# Patient Record
Sex: Male | Born: 1955 | Race: Black or African American | Hispanic: No | Marital: Single | Smoking: Never smoker
Health system: Southern US, Community
[De-identification: ages and names within clinical notes are randomized; demographics above are authoritative.]

## PROBLEM LIST (undated history)

## (undated) DIAGNOSIS — N400 Enlarged prostate without lower urinary tract symptoms: Secondary | ICD-10-CM

## (undated) DIAGNOSIS — R1310 Dysphagia, unspecified: Secondary | ICD-10-CM

## (undated) DIAGNOSIS — G2581 Restless legs syndrome: Secondary | ICD-10-CM

## (undated) DIAGNOSIS — S069XAA Unspecified intracranial injury with loss of consciousness status unknown, initial encounter: Secondary | ICD-10-CM

## (undated) DIAGNOSIS — I10 Essential (primary) hypertension: Secondary | ICD-10-CM

## (undated) DIAGNOSIS — K219 Gastro-esophageal reflux disease without esophagitis: Secondary | ICD-10-CM

## (undated) DIAGNOSIS — E669 Obesity, unspecified: Secondary | ICD-10-CM

## (undated) DIAGNOSIS — N390 Urinary tract infection, site not specified: Secondary | ICD-10-CM

## (undated) DIAGNOSIS — K591 Functional diarrhea: Secondary | ICD-10-CM

## (undated) DIAGNOSIS — Z1211 Encounter for screening for malignant neoplasm of colon: Secondary | ICD-10-CM

## (undated) DIAGNOSIS — R739 Hyperglycemia, unspecified: Secondary | ICD-10-CM

## (undated) HISTORY — PX: CHOLECYSTECTOMY: SHX55

## (undated) HISTORY — PX: OTHER SURGICAL HISTORY: SHX169

## (undated) HISTORY — PX: HERNIA REPAIR: SHX51

---

## 2002-04-03 ENCOUNTER — Encounter: Payer: Self-pay | Admitting: Family Medicine

## 2002-04-03 ENCOUNTER — Ambulatory Visit (HOSPITAL_COMMUNITY): Admission: RE | Admit: 2002-04-03 | Discharge: 2002-04-03 | Payer: Self-pay | Admitting: Nurse Practitioner

## 2008-02-01 LAB — PSA SCREENING (SCREENING): Prostate Specific Ag: 0.8

## 2009-10-30 LAB — PSA SCREENING (SCREENING): Prostate Specific Ag: 1.96

## 2010-08-11 DIAGNOSIS — N400 Enlarged prostate without lower urinary tract symptoms: Secondary | ICD-10-CM | POA: Insufficient documentation

## 2010-08-15 MED ORDER — NITROFURANTOIN (25% MACROCRYSTAL FORM) 100 MG CAP
100 mg | ORAL_CAPSULE | Freq: Two times a day (BID) | ORAL | Status: AC
Start: 2010-08-15 — End: 2010-08-22

## 2010-08-15 NOTE — Telephone Encounter (Signed)
Called and spoke with pt's sister Darel Hong - told uc +, Rx Macrobid 100 mg bid x 7 days per Dr Esmeralda Arthur.  Rx faxed to [facility].    Sister also wanted to let Dr Verdie Mosher know that he has always had to stay overnight after any surgery because of muscle spasms and weakness. She would feel better if he stayed overnight after his surgery next week.

## 2010-08-18 NOTE — Procedures (Signed)
Test Reason : Pre/Op Cardiovascular Exam   Blood Pressure : ***/*** mmHG   Vent. Rate : 058 BPM     Atrial Rate : 058 BPM      P-R Int : 172 ms          QRS Dur : 096 ms       QT Int : 422 ms       P-R-T Axes : 055 108 017 degrees      QTc Int : 414 ms   Sinus bradycardia   Rightward axis   Abnormal ECG   No previous ECGs available   Confirmed by Miller, M.D., Edward ([id number]) on 08/18/2010 1:22:26 PM   Referred By:             Overread By: Edward Miller, M.D.

## 2010-08-18 NOTE — Procedures (Signed)
Test Reason : Pre/Op Cardiovascular Exam   Blood Pressure : ***/*** mmHG   Vent. Rate : 058 BPM     Atrial Rate : 058 BPM      P-R Int : 172 ms          QRS Dur : 096 ms       QT Int : 422 ms       P-R-T Axes : 055 108 017 degrees      QTc Int : 414 ms   Sinus bradycardia   Rightward axis   Abnormal ECG   No previous ECGs available   Confirmed by Hyacinth Meeker, M.D., Ramon Dredge ([id number]) on 08/18/2010 1:22:26 PM   Referred By:             Overread By: Berton St. Martin, M.D.
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
## 2010-08-21 NOTE — Op Note (Signed)
[facility]   Operation Report   NAME:  [name] Murillo, [name] Murillo   SEX:   M   DATE: 08/21/2010   DOB: 1955/09/15   MR#    [id number]   ROOM:     ACCT#  [id number]       cc: Mathews Argyle MD       PREOPERATIVE DIAGNOSES:   Neuropathic bladder, urinary incontinence and recurrent urinary tract    infections.       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       PROCEDURES PERFORMED:   Cystoscopy, retrograde pyelograms and dilation of bladder neck contracture.       SURGEON:   Mathews Argyle, MD       ANESTHESIA:   General.       BLOOD LOSS:   Minimal.       COMPLICATIONS:   None.       POSTOPERATIVE DRAINS:   None.       DESCRIPTION OF PROCEDURE:   The patient was identified in the holding area.  He received 2 grams of Ancef    preoperatively.  He was brought into the cysto room and placed on the table.     After induction of anesthesia, his legs were put up into the lithotomy    position in Isabel stirrups.  His lower abdomen and genitalia were prepped and    draped in a sterile fashion.  After appropriate timeout, I passed a 21-French    rigid cystoscope under direct visualization into the urethra.  He had no    evidence of any urethral stricture.  As I advanced the scope through the    prostate, he had previous TURP defect and there was a mild bladder neck    contracture approximately 20-French in size.  I was able to push the 21-French    scope through the contracture and get into the bladder.  The bladder was    moderately trabeculated with several cellule formations.  Both ureteral    orifices were located in their orthotopic positions.  I carefully inspected    the entire bladder and found no other urothelial lesions and no stones within    the bladder.  I first cannulated the right ureteral orifice.  Contrast was     injected for a right retrograde pyelogram which showed a normal ureter and    renal collecting system with no filling defects and no obstruction.  I then    similarly cannulated the left ureteral orifice and injected contrast for a    left retrograde pyelogram.  Some small air bubbles were visualized migrating    up into the proximal ureter and renal pelvis, but there was no evidence of any    obstruction, and after even efflux of the air bubbles, I saw no evidence of    any other filling defects within the left renal collecting system.  There was    good drainage of contrast from both upper tracts.  After completion of the    retrograde pyelograms, I then gently dilated the bladder neck contracture with    Sissy Hoff urethral sounds from 20-French all the way up to 26-French.  The    bladder neck was widely patent after this procedure.  There was minimal    bleeding.  We drained all of the irrigation fluid  out of the patient's    bladder.  He was awakened and taken to the recovery room in stable condition.           ___________________   Page Spiro MD   Dictated By:.    lo   D:08/21/2010   T: 08/21/2010 10:51:23   [id number]

## 2010-08-21 NOTE — Op Note (Signed)
[facility]   Operation Report   NAME:  [name] Murillo, [name] Murillo   SEX:   M   DATE: 08/21/2010   DOB: 08-01-1955   MR#    [id number]   ROOM:     ACCT#  [id number]       cc: Mathews Argyle MD       PREOPERATIVE DIAGNOSES:   Neuropathic bladder, urinary incontinence and recurrent urinary tract    infections.       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       PROCEDURES PERFORMED:   Cystoscopy, retrograde pyelograms and dilation of bladder neck contracture.       SURGEON:   Mathews Argyle, MD       ANESTHESIA:   General.       BLOOD LOSS:   Minimal.       COMPLICATIONS:   None.       POSTOPERATIVE DRAINS:   None.       DESCRIPTION OF PROCEDURE:   The patient was identified in the holding area.  He received 2 grams of Ancef    preoperatively.  He was brought into the cysto room and placed on the table.     After induction of anesthesia, his legs were put up into the lithotomy    position in [place] stirrups.  His lower abdomen and genitalia were prepped and    draped in a sterile fashion.  After appropriate timeout, I passed a 21-French    rigid cystoscope under direct visualization into the urethra.  He had no    evidence of any urethral stricture.  As I advanced the scope through the    prostate, he had previous TURP defect and there was a mild bladder neck    contracture approximately 20-French in size.  I was able to push the 21-French    scope through the contracture and get into the bladder.  The bladder was    moderately trabeculated with several cellule formations.  Both ureteral    orifices were located in their orthotopic positions.  I carefully inspected    the entire bladder and found no other urothelial lesions and no stones within    the bladder.  I first cannulated the right ureteral orifice.  Contrast was    injected for a right retrograde pyelogram  which showed a normal ureter and    renal collecting system with no filling defects and no obstruction.  I then    similarly cannulated the left ureteral orifice and injected contrast for a    left retrograde pyelogram.  Some small air bubbles were visualized migrating    up into the proximal ureter and renal pelvis, but there was no evidence of any    obstruction, and after even efflux of the air bubbles, I saw no evidence of    any other filling defects within the left renal collecting system.  There was    good drainage of contrast from both upper tracts.  After completion of the    retrograde pyelograms, I then gently dilated the bladder neck contracture with    Sissy Hoff urethral sounds from 20-French all the way up to 26-French.  The    bladder neck was widely patent after this procedure.  There was minimal    bleeding.  We drained all of the irrigation fluid  out of the patient's    bladder.  He was awakened and taken to the recovery room in stable condition.           ___________________   Page Spiro MD   Dictated By:.    lo   D:08/21/2010   T: 08/21/2010 10:51:23   [id number]
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
## 2010-12-22 NOTE — ED Provider Notes (Signed)
KNOWN ALLERGIES   NKDA       TRIAGE   PATIENT: NAME: [name] Murillo, AGE: 55, GENDER: male, DOB: 06/16/1956, TIME OF GREET: Mon Dec 22, 2010 12:18, LANGUAGE: [place],         [place]: [id number], MEDICAL RECORD NUMBER: [phone], ACCOUNT NUMBER:         [id number], PCP: [place], Hyman Hopes,.   ADMISSION: URGENCY: 3, AMBULANCE: [place] #, TRANSPORT:         Ambulance, DEPT: Emergency, BED: 2ED 40.   COMPLAINT:  Medic Dizzy.   PRESENTING COMPLAINT:  Dizziness, Since Today.   PAIN: No complaint of pain.   IMMUNIZATIONS:  Immunizations up to date, Last tetanus shot         received less than 10 years ago.   TB SCREENING: TB screen not applicable for this patient.   ABUSE SCREENING: Not Applicable.   FALL RISK: Patient has a high risk of falling.   SUICIDAL IDEATION: Not Applicable.   ADVANCE DIRECTIVES: Unknown if patient has advance directives,         Triage assessment performed.   PROVIDERS: TRIAGE NURSE: Lajean Silvius, RN.       AMBULANCE   AMBULANCE: Ambulance: Mon Dec 22, 2010 11:56.       CURRENT MEDICATIONS   Lopressor:  50 mg Oral once a day.   Baclofen:  20 mg Oral 4 times a day.   Benicar:  40 mg Oral once a day.   Lidocaine, Topical:  Patch presently on back.       ORDERS   Urine dip (send for lab U/A if positive):  Ordered for: Wilfrid Lund, MD, Tawanna Cooler         Status: Done by Ludwig Clarks RN, MSN, Kim-Sun Mon Dec 22, 2010 13:48.   CHEST 2 VIEWS:  Ordered for: Wilfrid Lund, MD, Todd         Status: Cancelled by Alferd Patee Dec 22, 2010 13:27.   12 LEAD EKG:  Ordered for: Wilfrid Lund, MD, Todd         Status: Cancelled by Alferd Patee Dec 22, 2010 13:27.   I-Stat Chem8+ (complete):  Ordered for: Wilfrid Lund, MD, Tawanna Cooler         Status: Done by Ludwig Clarks RN, MSN, Kim-Sun Mon Dec 22, 2010 13:48.   orthostatic VS:  Ordered for: Wilfrid Lund, MD, Tawanna Cooler         Status: Done by Margo Aye, RN, [facility] Dec 22, 2010 13:55.   URINAL:  Ordered for: Wilfrid Lund, MD, Todd         Status: Active.       Ordered for: Wilfrid Lund, MD, Todd         Status: Active.       NURSING ASSESSMENT: CARDIOVASCULAR   CONSTITUTIONAL: History obtained from patient, Patient arrives,         via [facility], Gait steady, Patient appears         comfortable, Patient cooperative, Patient alert, Oriented to person,         place and time, Skin warm, Skin dry, Skin normal in color, Mucous         membranes pink, Mucous membranes moist, Patient is well-groomed,         Patient complains of Dizziness this morning, Not dizzy at this time.   PAIN: Patient rates pain as  0 out of 10.   CARDIOVASCULAR: Cardiovascular assessment findings include heart         rate normal, Associated with diaphoresis, currently resolved.   RESPIRATORY/CHEST: Respiratory assessment findings include         respiratory effort easy, Respirations regular, Conversing normally,         no signs of distress, Breath sounds clear, to bilateral upper lobes,         to bilateral lower lobes, Neck and chest exam findings include         trachea midline, Chest expansion equal, Chest movement symmetrical,         no jugular vein distension.   SAFETY: Side rails up, Cart/Stretcher in lowest position, Family         at bedside, Call light within reach, Hospital ID band on.       NURSING PROCEDURE: DISCHARGE NOTE   DISCHARGE: Patient discharged to home, in a wheelchair, family         driving, accompanied by husband/wife/partner, Discharge instructions         given to patient, Simple or moderate discharge teaching performed.   SAFETY: Side rails up, Cart/Stretcher in lowest position, Family         at bedside.       NURSING PROCEDURE: LAB DRAW   PATIENT IDENTIFIER: Patient's identity verified by patient         stating name, Patient's identity verified by patient stating birth         date, Patient's identity verified by hospital ID bracelet.   LAB DRAW: Initial lab draw performed, by venipuncture, from left          antecubital, in one attempt, Lab specimens labeled in the presence of         the patient and sent to lab, Blood cultures were drawn at 1350.   FOLLOW-UP: After procedure, dressing applied to site.   NOTES: Patient tolerated procedure well.   SAFETY: Cart/Stretcher in lowest position, Family at bedside,         Call light within reach, Hospital ID band on.       NURSING PROCEDURE: ORTHOSTATIC VITAL SIGNS   PATIENT IDENTIFIER: Patient's identity verified by patient         stating name, Patient's identity verified by patient stating birth         date, Patient's identity verified by hospital ID bracelet.   ORTHOSTATIC VITAL SIGNS: Orthostatic vital signs indicated for         dizziness, Lying:, Systolic blood pressure: 134, Diastolic blood         pressure: 76, Pulse: 61, No dizziness, Sitting:, Systolic blood         pressure: 145, Diastolic blood pressure: 96, Pulse: 67, No dizziness         with position change, Standing:, Systolic blood pressure: 139,         Diastolic blood pressure: 90, Pulse: 67, No dizziness with position         change.   SAFETY: Side rails up, Cart/Stretcher in lowest position, Family         at bedside, Call light within reach, Hospital ID band on.       DISPOSITION   PATIENT:  Disposition Type: Discharged, Disposition: Discharged,         Condition: Stable.      IV Infusion: N/A, Patient left the department.       INSTRUCTION   FOLLOWUP:  [facility],  [facility], [id number], [place]         [place] [address], [phone].   SPECIAL:  Follow up with primary care physician this week.         Drink plenty of fluids.         Take your medications as prescribed.         Return to the ER if condition worsens or new symptoms develop.   Key:     CNL2=Lamb, RN, Charmaine  CRH1=Hall, RN, [employer]  KMJ=Jones, PA-C, [place]     KNM0=Moreira, RN, MSN, Kim-Sun

## 2011-01-05 NOTE — Op Note (Signed)
[facility]   Operation Report   NAME:  [place], [place]   SEX:   M   DATE: 01/05/2011   DOB: [birth year]   MR#    [id number]   ROOM:     ACCT#  [id number]               PREOPERATIVE DIAGNOSIS:   Gallstones, chronic cholecystitis.       POSTOPERATIVE DIAGNOSIS:   Gallstones, chronic cholecystitis.       PROCEDURE PERFORMED:   Laparoscopic cholecystectomy.       SURGEON:   Dr. Rosebud Poles.        ANESTHESIA:   General.       ESTIMATED BLOOD LOSS:   Minimal.       ASSISTANT:   Magnus Sinning       SPECIMENS:   Gallbladder.       DESCRIPTION OF PROCEDURE:   The patient was brought to the operating room and after receiving general    anesthesia by the anesthesiology team, his abdomen was prepped and draped in a    sterile fashion.  We made a 0.5 cm transverse umbilical incision.  A Veress    was passed intraperitoneally.  The abdomen was insufflated to 14 mmHg    pressure.  The needle was removed.  We placed a 5 mm trocar.  The video    endoscope was placed.  Laparoscopy revealed the gallbladder to be chronically    inflamed.  Under direct vision, we placed upper midline 10/12 trocar and 2    lateral 5-mm trocars.  Lateral ports were used to place the liver and    gallbladder in upward traction.  The tract laterally to expose the region of    the triangle of Calot.  The cystic duct was identified.  It was dissected    free, high at its junction with the gallbladder.  The duct was then clipped    proximally times 2, distally times 1 and divided.  We were then able to    identify the cystic artery.  This was also dissected free high at its junction    with the artery, was then clipped proximally times 2, distally times 1 and    divided.  The gallbladder was then dissected out of liver bed using    electrocautery.  It was removed through the upper midline port in an Endobag    and sent for permanent section.  Hemostasis was complete.  Sponge, needle and     instrument counts were announced correct times 2 by the circulating staff.     Camera and ports removed.  Fascial incisions in the upper midline port were    closed with 0 Vicryl sutures.  The skin incisions were all closed with    staples.  Dry sterile dressings were applied.  The patient tolerated procedure    well.           ___________________   Marlowe Alt MD   Dictated By:.    ak   D:01/05/2011   T: 01/05/2011 17:03:06   [id number]

## 2011-01-05 NOTE — Op Note (Signed)
[facility]   Operation Report   NAME:  [name] Murillo, [name] Murillo   SEX:   M   DATE: 01/05/2011   DOB: May 14, 1956   MR#    [id number]   ROOM:     ACCT#  [id number]               PREOPERATIVE DIAGNOSIS:   Gallstones, chronic cholecystitis.       POSTOPERATIVE DIAGNOSIS:   Gallstones, chronic cholecystitis.       PROCEDURE PERFORMED:   Laparoscopic cholecystectomy.       SURGEON:   Dr. Rosebud Poles.        ANESTHESIA:   General.       ESTIMATED BLOOD LOSS:   Minimal.       ASSISTANT:   [name] Murillo       SPECIMENS:   Gallbladder.       DESCRIPTION OF PROCEDURE:   The patient was brought to the operating room and after receiving general    anesthesia by the anesthesiology team, his abdomen was prepped and draped in a    sterile fashion.  We made a 0.5 cm transverse umbilical incision.  A Veress    was passed intraperitoneally.  The abdomen was insufflated to 14 mmHg    pressure.  The needle was removed.  We placed a 5 mm trocar.  The video    endoscope was placed.  Laparoscopy revealed the gallbladder to be chronically    inflamed.  Under direct vision, we placed upper midline 10/12 trocar and 2    lateral 5-mm trocars.  Lateral ports were used to place the liver and    gallbladder in upward traction.  The tract laterally to expose the region of    the triangle of Calot.  The cystic duct was identified.  It was dissected    free, high at its junction with the gallbladder.  The duct was then clipped    proximally times 2, distally times 1 and divided.  We were then able to    identify the cystic artery.  This was also dissected free high at its junction    with the artery, was then clipped proximally times 2, distally times 1 and    divided.  The gallbladder was then dissected out of liver bed using    electrocautery.  It was removed through the upper midline port in an Endobag    and sent for permanent section.  Hemostasis was complete.  Sponge, needle and    instrument counts were announced correct times 2 by the  circulating staff.     Camera and ports removed.  Fascial incisions in the upper midline port were    closed with 0 Vicryl sutures.  The skin incisions were all closed with    staples.  Dry sterile dressings were applied.  The patient tolerated procedure    well.           ___________________   Marlowe Alt MD   Dictated By:.    ak   D:01/05/2011   T: 01/05/2011 17:03:06   [id number]

## 2011-02-09 LAB — AMB POC URINALYSIS DIP STICK AUTO W/O MICRO
Bilirubin (UA POC): NEGATIVE
Glucose (UA POC): NEGATIVE
Ketones (UA POC): NEGATIVE
Nitrites (UA POC): NEGATIVE
Protein (UA POC): NEGATIVE mg/dL
Specific gravity (UA POC): 1.01 (ref 1.001–1.035)
Urobilinogen (UA POC): 0.2
pH (UA POC): 6.5 (ref 4.6–8.0)

## 2011-02-09 LAB — AMB POC PVR, MEAS,POST-VOID RES,US,NON-IMAGING: PVR: 0 cc
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
## 2011-02-09 NOTE — Patient Instructions (Signed)
MyChart Activation    Thank you for requesting access to MyChart. Please follow the instructions below to securely access and download your online medical record. MyChart allows you to send messages to your doctor, view your test results, renew your prescriptions, schedule appointments, and more.    How Do I Sign Up?    1. In your internet browser, go to [email or web address]  2. Click on the First Time User? Click Here link in the Sign In box. You will be redirect to the New Member Sign Up page.  3. Enter your MyChart Access Code exactly as it appears below. You will not need to use this code after you???ve completed the sign-up process. If you do not sign up before the expiration date, you must request a new code.    MyChart Access Code: [email or web address]  Expires: 05/10/2011  4:03 PM (This is the date your MyChart access code will expire)    4. Enter the last four digits of your Social Security Number (xxxx) and Date of Birth (mm/dd/yyyy) as indicated and click Submit. You will be taken to the next sign-up page.  5. Create a MyChart ID. This will be your MyChart login ID and cannot be changed, so think of one that is secure and easy to remember.  6. Create a MyChart password. You can change your password at any time.  7. Enter your Password Reset Question and Answer. This can be used at a later time if you forget your password.   8. Enter your e-mail address. You will receive e-mail notification when new information is available in MyChart.  9. Click Sign Up. You can now view and download portions of your medical record.  10. Click the Download Summary menu link to download a portable copy of your medical information.    Additional Information    If you have questions, please visit the Frequently Asked Questions section of the MyChart website at [email or web address] Remember, MyChart is NOT to be used for urgent needs. For medical emergencies, dial 911.

## 2011-02-11 ENCOUNTER — Telehealth

## 2011-02-11 MED ORDER — NITROFURANTOIN (25% MACROCRYSTAL FORM) 100 MG CAP
100 mg | ORAL_CAPSULE | Freq: Two times a day (BID) | ORAL | Status: AC
Start: 2011-02-11 — End: 2011-02-16

## 2011-02-11 NOTE — Telephone Encounter (Signed)
Staph UTI.  macrobid bid x 10 days.  Rx faxed to pharmacy of choice, please notify patient to pick up and take as directed

## 2011-02-12 NOTE — Telephone Encounter (Signed)
[name] Darel Hong (pt's caregiver) told.

## 2011-02-26 MED ORDER — TRIMETHOPRIM-SULFAMETHOXAZOLE 160 MG-800 MG TAB
160-800 mg | ORAL_TABLET | Freq: Two times a day (BID) | ORAL | Status: AC
Start: 2011-02-26 — End: 2011-03-08

## 2011-02-26 NOTE — Telephone Encounter (Signed)
If someone could please give Bosie Clos a phone call regarding her brother. Pt had a UTI and was giving Macrobid pt is done with all of his medication and he is still having problems. Pt sister can be reached at [phone]               Thank you

## 2011-02-26 NOTE — Telephone Encounter (Signed)
Spoke to [place] the patient's sister.  She said that she thinks he still might have a UTI.  He finished the Marcobid on Sunday and he is still having Frequency and Fever.  I advised patient's sister that he would need to bring in another urine specimen to see if it is already gone.  She said because patient is in a wheel chair, it is hard for him to get in the office as well as he can not pee on command.  Please advise if we can give him another antibiotic to try or what you would suggest

## 2011-02-26 NOTE — Telephone Encounter (Signed)
Can use Bactrim bid x 10 days.  Rx faxed to pharmacy of choice, please notify patient to pick up and take as directed

## 2011-02-27 NOTE — Telephone Encounter (Signed)
Spoke to patient's sister and will pick up medication at [facility]

## 2011-05-11 LAB — AMB POC URINALYSIS DIP STICK AUTO W/O MICRO
Bilirubin (UA POC): NEGATIVE
Glucose (UA POC): NEGATIVE
Ketones (UA POC): NEGATIVE
Nitrites (UA POC): NEGATIVE
Specific gravity (UA POC): 1.03 (ref 1.001–1.035)
Urobilinogen (UA POC): 0.2 (ref 0.2–1)
pH (UA POC): 6 (ref 4.6–8.0)

## 2011-05-11 LAB — AMB POC PVR, MEAS,POST-VOID RES,US,NON-IMAGING: PVR: 7 cc
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
## 2011-05-11 NOTE — Patient Instructions (Signed)
MyChart Activation    Thank you for requesting access to MyChart. Please follow the instructions below to securely access and download your online medical record. MyChart allows you to send messages to your doctor, view your test results, renew your prescriptions, schedule appointments, and more.    How Do I Sign Up?    1. In your internet browser, go to [email or web address]  2. Click on the First Time User? Click Here link in the Sign In box. You will be redirect to the New Member Sign Up page.  3. Enter your MyChart Access Code exactly as it appears below. You will not need to use this code after you???ve completed the sign-up process. If you do not sign up before the expiration date, you must request a new code.    MyChart Access Code: [email or web address]  Expires: 08/09/2011 11:21 AM (This is the date your MyChart access code will expire)    4. Enter the last four digits of your Social Security Number (xxxx) and Date of Birth (mm/dd/yyyy) as indicated and click Submit. You will be taken to the next sign-up page.  5. Create a MyChart ID. This will be your MyChart login ID and cannot be changed, so think of one that is secure and easy to remember.  6. Create a MyChart password. You can change your password at any time.  7. Enter your Password Reset Question and Answer. This can be used at a later time if you forget your password.   8. Enter your e-mail address. You will receive e-mail notification when new information is available in MyChart.  9. Click Sign Up. You can now view and download portions of your medical record.  10. Click the Download Summary menu link to download a portable copy of your medical information.    Additional Information    If you have questions, please visit the Frequently Asked Questions section of the MyChart website at [email or web address] Remember, MyChart is NOT to be used for urgent needs. For medical emergencies, dial 911.

## 2011-05-19 NOTE — Telephone Encounter (Signed)
[name] called stating Darl Pikes at the surgeon's office stated she did not receive a letter of clearance for pt to have surgery.  Told Ms Lipari we faxed over last office note which includes the following statements:    Catheterized patient for sterile specimen and will repeat culture today. If sterile can proceed with surgery as planned.    Also faxed the urine culture which was no growth. Explained this to Ms Hunt and called Darl Pikes - left her a message stating above but also re-faxed both office note and culture to her.

## 2011-07-05 DIAGNOSIS — K219 Gastro-esophageal reflux disease without esophagitis: Secondary | ICD-10-CM | POA: Insufficient documentation

## 2011-08-14 DIAGNOSIS — S069XAS Unspecified intracranial injury with loss of consciousness status unknown, sequela: Secondary | ICD-10-CM | POA: Insufficient documentation

## 2011-10-14 LAB — AMB POC URINALYSIS DIP STICK AUTO W/O MICRO
Bilirubin (UA POC): NEGATIVE
Blood (UA POC): NEGATIVE
Glucose (UA POC): NEGATIVE
Nitrites (UA POC): NEGATIVE
Specific gravity (UA POC): 1.025 (ref 1.001–1.035)
Urobilinogen (UA POC): 0.2 (ref 0.2–1)
pH (UA POC): 6.5 (ref 4.6–8.0)

## 2011-10-14 LAB — AMB POC PVR, MEAS,POST-VOID RES,US,NON-IMAGING: PVR: 19 cc
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
## 2011-10-14 NOTE — Telephone Encounter (Signed)
[name] called to ask to see Dr. Verdie Mosher because her brother was having incontience, no flow while unrinating, frequency, and fever. I offered the walk in clinic and they were ok with that idea. I told them that I would let them know that they were coming.
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
## 2011-10-19 MED ORDER — TRIMETHOPRIM-SULFAMETHOXAZOLE 160 MG-800 MG TAB
160-800 mg | ORAL_TABLET | Freq: Two times a day (BID) | ORAL | Status: AC
Start: 2011-10-19 — End: 2011-10-24

## 2011-10-19 NOTE — Telephone Encounter (Signed)
I spoke with the sister who is on the updated HIPPA form and she is aware of the positive culture and medication that was e scribed to the pharmacy.

## 2011-10-19 NOTE — Progress Notes (Signed)
Quick Note:    Start on Bactrim DS BID x 5 days  Marranda Arakelian, NP    ______

## 2011-10-19 NOTE — Telephone Encounter (Signed)
I called the patient and left a message on the home phone and the mobile for the patient to call back to the office. I was calling because the patient has a positive culture and medication was e scribed to the pharmacy that is listed in the chart. It is Bactrim DS PO BID x 5 days.

## 2011-11-02 LAB — AMB POC URINALYSIS DIP STICK AUTO W/O MICRO
Bilirubin (UA POC): NEGATIVE
Blood (UA POC): NEGATIVE
Glucose (UA POC): NEGATIVE
Ketones (UA POC): NEGATIVE
Nitrites (UA POC): NEGATIVE
Protein (UA POC): NEGATIVE mg/dL
Specific gravity (UA POC): 1.005 (ref 1.001–1.035)
Urobilinogen (UA POC): 0.2 (ref 0.2–1)
pH (UA POC): 5.5 (ref 4.6–8.0)

## 2011-11-02 NOTE — Progress Notes (Signed)
11/02/2011  Pt in with possible UTI.  His caregiver states he is weak so it is assumed that he has a UTI.  Denies dysuria, hematuria, freq/urgency.    Results for orders placed in visit on 11/02/11   AMB POC URINALYSIS DIP STICK AUTO W/O MICRO       Component Value Range    Color Yellow  (none)    Clarity Clear  (none)    Glucose Negative  (none)    Bilirubin Negative  (none)    Ketones Negative  (none)    Spec.Grav. 1.005  1.001 - 1.035    Blood Negative  (none)    pH 5.5  4.6 - 8.0    Protein, Urine Negative  Negative mg/dL    Urobilinogen 0.2 mg/dL  0.2 - 1    Nitrites Negative  (none)    Leukocyte esterase 1+  (none)     Exam-abd-soft, non-tender  Back-no CVAT    Imp-?UTI  Plan-will send urine for C & S and treat if necessary  Lytle Butte, NP

## 2011-11-02 NOTE — Telephone Encounter (Signed)
Patients sister called to advise her brothers UTI is acting up again and she wants him to be seen as soon as possible. She mentioned that she was told at last visit that he can come back to clinic if needed. Thanks

## 2011-11-02 NOTE — Communication Body (Signed)
11/02/2011  Pt in with possible UTI.  His caregiver states he is weak so it is assumed that he has a UTI.  Denies dysuria, hematuria, freq/urgency.    Results for orders placed in visit on 11/02/11   AMB POC URINALYSIS DIP STICK AUTO W/O MICRO       Component Value Range    Color Yellow  (none)    Clarity Clear  (none)    Glucose Negative  (none)    Bilirubin Negative  (none)    Ketones Negative  (none)    Spec.Grav. 1.005  1.001 - 1.035    Blood Negative  (none)    pH 5.5  4.6 - 8.0    Protein, Urine Negative  Negative mg/dL    Urobilinogen 0.2 mg/dL  0.2 - 1    Nitrites Negative  (none)    Leukocyte esterase 1+  (none)     Exam-abd-soft, non-tender  Back-no CVAT    Imp-?UTI  Plan-will send urine for C & S and treat if necessary  Atif Chapple, NP

## 2011-11-11 NOTE — Telephone Encounter (Signed)
Letter mailed out results.

## 2012-02-23 NOTE — ED Provider Notes (Signed)
KNOWN ALLERGIES   NKDA   TRIAGE (Tue Feb 23, 2012 07:56 BRH0)   PATIENT: NAME: [name] Murillo, [place]: male, DOB: Sun Aug 28, 1955, TIME OF GREET: Tue Feb 23, 2012 07:54 by Cherre Robins, RN,         LANGUAGE: [place], [place] WEIGHT: 106.6. (Tue Feb 23, 2012 07:56 BRH0)     HEIGHT: 195cm, BMI: 28.03. (08:14 BRH0)   ADMISSION: URGENCY: 4, AMBULANCE: [facility], TRANSPORT:         Ambulance - BLS, DEPT: Emergency, BED: [facility]ing 06. (Tue Feb 23, 2012         07:56 BRH0)   COMPLAINT:  Fall. (Tue Feb 23, 2012 07:56 BRH0)   PRESENTING COMPLAINT:  Fall-Right knee injury, Since Today, TIME         Since 07:00. (08:19 BRH0)   PAIN: Patient complains of pain, Pain described as aching, On a         scale 0-10 patient rates pain as 2, Pain is constant. (08:19 BRH0)   TB SCREENING: TB screen not applicable for this patient. (08:19         BRH0)   ABUSE SCREENING: Patient denies physical abuse or threats. (08:19         BRH0)   FALL RISK: Patient has a high risk of falling. (08:19 BRH0)   SUICIDAL IDEATION: Suicidal ideation is not present. (08:19 BRH0)   ADVANCE DIRECTIVES: Patient does not have advance directives.         (08:19 BRH0)   PROVIDERS: TRIAGE NURSE: Cherre Robins, RN. (Tue Feb 23, 2012         07:56 BRH0)   PREVIOUS VISIT ALLERGIES: NKDA. (08:19 BRH0)   PRESENTING PROBLEM (07:57 BRH0)      Presenting problems: Knee Injury-Pain-Swelling.   AMBULANCE (07:33 CML2)   NOTES: Blood Pressure: 124/79, Blood Sugar: 88.   AMBULANCE: Ambulance: Tue Feb 23, 2012 07:33.   GREET (07:54 BRH0)   GREET: Greet: Tue Feb 23, 2012 07:54.   CURRENT MEDICATIONS   Polyethylene Glycol 3350 : Powder For Reconstitution : - : Oral:          17 gm Oral once a day. (08:05 BRH0)   AmLODIPine Besylate : Tablet : 10 Mg : Oral:  1 tab(s) Oral once         a day. (08:06 BRH0)   Baclofen : Tablet : 20 Mg : Oral:  See Notes. 1 tab in a.m., 2         tabs in evening and at HS. (08:07 BRH0)    Tamsulosin Hydrochloride : Capsule : 0.4 Mg : Oral:  1 tab(s)         Oral once a day (at bedtime). (08:07 BRH0)   Aspir 81 : Delayed Release Tablet : 81 Mg : Oral:  1 tab(s) Oral         once a day. (08:09 BRH0)   Metoprolol Succinate ER : Tablet, Extended Release : 50 Mg : Oral:          1 tab(s) Oral once a day. (08:09 BRH0)      Name: [name] Murillo, [name] Murillo  DOB: Jul 20, 1956 M56 MedRec: [id number]  AcctNum:     [id number]   Benicar : Tablet : 40 Mg : Oral:  1 tab(s) Oral once a day.         (08:09 BRH0)   Centrum Silver : Tablet : Therapeutic Multiple Vitamins With Minerals         :  Oral (08:10 BRH0)   Carbidopa-Levodopa : Tablet : 25 Mg-100 Mg : Oral:  1 tab(s) Oral         3 times a day. (08:10 BRH0)   Vitamin D3 : Capsule : 1000 Intl Units : Oral:  1 tab(s) Oral         once a day. (08:11 BRH0)   Omeprazole : Delayed Release Capsule : 40 Mg : Oral:  1 tab(s)         Oral once a day. (08:13 BRH0)   Lidoderm : Film : 5% : Topical (08:13 BRH0)   Gabapentin : Capsule : 300 Mg : Oral:  1 tab(s) Oral 2 times a         day. (08:14 BRH0)   Elavil : Tablet : 10 Mg : Oral (08:21 BRH0)   ORDERS   KNEE COMPLETE:  Ordered for: Buddy Duty, M.D., Toni Amend         Status: Active         Comment: fall. (08:01 JOHO)   KNEE COMPLETE:  Ordered for: Buddy Duty, M.D., Toni Amend         Status: Active         Comment: fall. (08:01 JOHO)   ACE wrap:  Ordered forBuddy Duty, M.D., Courtney         Status: Done by: Vilma Meckel - Tue Feb 23, 2012 13:04. (09:01         CTZ0)   road test with walker:  Ordered for: Buddy Duty, M.D., Toni Amend         Status: Canceled by: Doretha Sou, RN, Bridget - Tue Feb 23, 2012 13:11.         (09:04 CTZ0)   LUMBARSACRAL SP 2 OR 3 VIEWS:  Ordered for: Buddy Duty, M.D.,         Toni Amend         Status: Active         Comment: FALL. (09:43 JOHO)   PELVIS 1 OR 2 VIEWS:  Ordered for: Buddy Duty, M.D., Toni Amend         Status: Active         Comment: FALL. (09:43 JOHO)   URINAL:  Ordered forBuddy Duty, M.D., Courtney          Status: Active. (11:31 BRH0)   URINAL:  Ordered for: Buddy Duty, M.D., Courtney         Status: Active. (11:31 BRH0)   Page Dr. Rozanna Box (PCP):  Ordered for: Buddy Duty, M.D.,         Toni Amend         Status: Done by: Mariane [name] Murillo - Tue Feb 23, 2012 11:41. (11:32         CTZ0)   Page hospitalist:  Lenna Gilford for: Buddy Duty, M.D., Toni Amend         Status: Done by: Mariane [name] Murillo - Tue Feb 23, 2012 12:35. (12:23         CTZ0)   Ace Wrap 6 inch:  Ordered for: Buddy Duty, M.D., Courtney         Status: Active. (14:59 BRH0)   NURSING ASSESSMENT: EXTREMITY LOWER (08:04 BRH0)   CONSTITUTIONAL: History obtained from, Emergency Medical      Name: [name] Murillo, [name] Murillo  DOB: [birth year] M56 MedRec: [id number]  AcctNum:     [id number]         Services, family member: sister, Patient arrives, via Emergency         Medical Services, Unsteady gait, Lift to cart, Inability to ambulate,  Patient appears comfortable, Patient cooperative, Patient alert,         Oriented to person, place and time, Skin warm, Skin dry, Skin normal         in color, Mucous membranes pink, Mucous membranes moist, Patient is         well-groomed.   PAIN: aching pain, to the right knee, constant, on a scale 0-10         patient rates pain as 2.   RIGHT LOWER EXTREMITY: Right lower extremity assessment findings         include capillary refill less than 2 seconds, Skin color normal, Skin         temperature warm, Distal sensation intact, Notes: Leg gave out on him         while trying to get out of the shower. Felt a pop while going down to         the floor per sister. No LOC. Pt did not hit his head per sister.   NURSING PROCEDURE: DISCHARGE NOTE (14:59 BRH0)   DISCHARGE: Patient discharged to home, in a wheelchair, family         driving, accompanied by other family member, Discharge instructions         given to patient, Discharge instructions given to legal guardian,         Simple or moderate discharge teaching performed, Above person(s)          verbalized understanding of discharge instructions and follow-up         care.   NURSING PROCEDURE: NURSE NOTES   NURSES NOTES: Pillow given to patient. (09:58 BRH0)     Notes: Family requesting pain med for pt and states they did not want the         road test because the reason he is here is because he did poorly with         the walker road test and was sent here to be admitted to a rehab.         (12:22 JDP0)     Other consult at bedside, from Care Manager. (13:24 BRH0)     Notes: Care Manager- Spoke to pt and family, FOC obtained for [facility] for possible PT/OT. [facility] rep will be in to speak to         pt and family.---Everlene Balls RN Care Manager. (13:28 SNA2)   NURSING PROCEDURE: SPLINTING [phone])   PATIENT IDENTIFIER: Patient's identity verified by patient         stating name, Patient's identity verified by patient stating birth         date, Patient's identity verified by hospital ID bracelet, Patient         actively involved in identification process.   SPLINTING: Splinting indicated for sprain care, Notes: applied 6         inch ace wrap to right knee.   FOLLOW-UP: After procedure, capillary refill less than 2 seconds,         After procedure, distal circulation intact, After procedure, distal         motor function intact, After procedure, distal sensation intact,         After procedure, distal pulses present.   SAFETY: Side rails up, Cart/Stretcher in lowest position, Family         at bedside, Call light within reach, Hospital ID band on.   NURSING PROCEDURE:  TRANSPORT TO TESTS   PATIENT IDENTIFIER: Patient's identity verified by patient         stating name, Patient's identity verified by patient stating birth         date, Patient's identity verified by hospital ID bracelet, Patient's      Name: [name] Murillo, [name] Murillo  DOB: 1956/02/09 M56 MedRec: [id number]  AcctNum:     [id number]         identity verified by family member. (08:32 DNS4)      Patient's identity verified by patient stating name, Patient's identity         verified by patient stating birth date, Patient's identity verified         by hospital ID bracelet, Patient's identity verified by family         member. (10:35 JNH1)   TRANSPORT TO TESTS: Patient transported to x-ray, via cart,         Accompanied by x-ray technician. (08:32 DNS4)     Patient transported to x-ray, via cart, Accompanied by x-ray technician,         Hand-off report received from [facility], RN, Clarisse Gouge. (10:35 JNH1)     Patient transported to, P T, via cart, Accompanied by emergency         department technician, Notes: For ambulatuion. (10:48 BRH0)   FOLLOW-UP: After procedure, patient returned to emergency         department. (08:32 DNS4)     After procedure, patient returned to emergency department, Hand-off         report was given to [facility], RN, Clarisse Gouge. (10:35 JNH1)     After procedure, patient returned to emergency department. (11:31 BRH0)   SAFETY: Side rails up, Cart/Stretcher in lowest position, Family         at bedside, Call light within reach. (08:32 DNS4)     Side rails up, Cart/Stretcher in lowest position, Family at bedside, Call         light within reach, Hospital ID band on. (10:35 JNH1)   DIAGNOSIS (09:01 CTZ0)   FINAL: PRIMARY: Right knee Strain, ADDITIONAL: Fall.   DISPOSITION   PATIENT:  Disposition Type: Discharged, Disposition: Discharge,         Condition: Stable. (09:01 CTZ0)      Disposition Type: (none), Disposition: (none), Condition: (none). (09:56         JOHO)      Disposition Type: Discharged, Disposition: Discharge, Condition: Stable.         (13:31 JOHO)      Patient left the department. (14:59 BRH0)   VITAL SIGNS   VITAL SIGNS: BP: 123/75 (Lying), Pulse: 74, Resp: 16, Temp: 98.4         (Oral), Pain: 2, O2 sat: 100 on Room air, Time: 02/23/2012 08:00.         (08:01 BRH0)     BP: 138/86 (Lying), Pulse: 79, Resp: 18, Time: 02/23/2012 10:38. (10:41         BRH0)      BP: 126/81 (Lying), Pulse: 79, Resp: 16, Temp: 97.8 (Oral), Pain: 2, O2         sat: 100 on Room air, Time: 02/23/2012 14:31. (14:32 BRH0)   INSTRUCTION (13:40 JOHO)   DISCHARGE:  KNEE SPRAIN [facility]).   [address], [address],         [place] [address], [phone].   SPECIAL:  -please follow-up w/ your doctor regarding placement in  a rehabilitation facility         -ice /elevate right knee         -tylenol as needed for pain         -Please follow-up with an orthopedist for recheck of knee if not      Name: [name] Murillo, [name] Murillo  DOB: 1956-05-27 M56 MedRec: [id number]  AcctNum:     [id number]         improving next 3-4 days.   EVENTS   TRANSFER:  Triage to [facility] 06. (Tue Feb 23, 2012 07:56 BRH0)      Removed from [facility] 06. (14:59 BRH0)   PRESCRIPTION     No recorded prescriptions   Key:     AMW4=Weddle, PM, Freida Busman BRH0=Hazer, RN, [employer] CML2=Loebe, RN, Jill Side     CTZ0=Zydron, M.D., Toni Amend DNS4=Stragand, RAD [facility], Onalee Hua JDP0=Parker,     RN,     Adela Lank     JNH1=Holland, RAD [facility], Raynelle Fanning JOHO=Hubbard, PA-C, Amil Amen SNA2=Amick, RN,     Jasmine December      Name: [name] Murillo, [name] Murillo  DOB: 1955/09/29 M56 MedRec: [id number]  AcctNum:     [id number]

## 2012-04-24 LAB — METABOLIC PANEL, BASIC
BUN: 18 mg/dl (ref 7–25)
CO2: 28 mEq/L (ref 21–32)
Calcium: 8.4 mg/dl — ABNORMAL LOW (ref 8.5–10.1)
Chloride: 108 mEq/L — ABNORMAL HIGH (ref 98–107)
Creatinine: 1.1 mg/dl (ref 0.6–1.3)
GFR est AA: >60
GFR est non-AA: >60
Glucose: 76 mg/dl (ref 74–106)
Potassium: 4 mEq/L (ref 3.5–5.1)
Sodium: 143 mEq/L (ref 136–145)

## 2012-04-24 LAB — CBC WITH AUTOMATED DIFF
BASOPHILS: 0 % (ref 0–3)
EOSINOPHILS: 3 % (ref 0–5)
HCT: 38 % (ref 37.0–50.0)
HGB: 12.4 gm/dl (ref 12.4–17.2)
LYMPHOCYTES: 17 % — ABNORMAL LOW (ref 28–48)
MCH: 30.8 pg (ref 23.0–34.6)
MCHC: 32.6 gm/dl (ref 30.0–36.0)
MCV: 94.5 fL (ref 80.0–98.0)
MONOCYTES: 12 % (ref 1–13)
MPV: 7.6 fL (ref 6.0–10.0)
NEUTROPHILS: 69 % — ABNORMAL HIGH (ref 34–64)
NRBC: 0 (ref 0–0)
PLATELET: 204 10*3/uL (ref 140–450)
RBC: 4.02 M/uL (ref 3.80–5.70)
RDW: 14.2 % — ABNORMAL HIGH (ref 11.5–14.0)
WBC: 6.3 10*3/uL (ref 4.0–11.0)

## 2012-04-24 LAB — POC URINE MACROSCOPIC
Bilirubin: NEGATIVE
Glucose: NEGATIVE mg/dl
Ketone: 80 mg/dl — AB
Nitrites: POSITIVE — AB
Protein: NEGATIVE mg/dl
Specific gravity: 1.025 (ref 1.005–1.030)
Urobilinogen: 0.2 EU/dl (ref 0.0–1.0)
pH (UA): 5.5 (ref 5–9)

## 2012-04-24 LAB — LACTIC ACID: Lactic Acid: 0.9 mmol/L (ref 0.4–2.0)

## 2012-04-24 LAB — TROPONIN I
Troponin-I: <0.015 ng/ml (ref 0.00–0.09)
Troponin-I: <0.015 ng/ml (ref 0.00–0.09)

## 2012-04-24 LAB — CKMB PROFILE
CK - MB: <0.5 ng/ml (ref 0.0–3.6)
CK - MB: <0.5 ng/ml (ref 0.0–3.6)
CK-MB Index: 0.6 % (ref 0.0–4.9)
CK-MB Index: 0.6 % (ref 0.0–4.9)
CK: 69 U/L (ref 39–308)
CK: 70 U/L (ref 39–308)

## 2012-04-24 LAB — PROCALCITONIN: PROCALCITONIN: <0.05 ng/ml (ref 0.00–0.50)
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
## 2012-04-25 LAB — RENAL FUNCTION PANEL
Albumin: 3.1 gm/dl — ABNORMAL LOW (ref 3.4–5.0)
BUN: 15 mg/dl (ref 7–25)
CO2: 26 mEq/L (ref 21–32)
Calcium: 8.1 mg/dl — ABNORMAL LOW (ref 8.5–10.1)
Chloride: 110 mEq/L — ABNORMAL HIGH (ref 98–107)
Creatinine: 0.7 mg/dl (ref 0.6–1.3)
GFR est AA: >60
GFR est non-AA: >60
Glucose: 87 mg/dl (ref 74–106)
Phosphorus: 3 mg/dl (ref 2.5–4.9)
Potassium: 3.7 mEq/L (ref 3.5–5.1)
Sodium: 144 mEq/L (ref 136–145)

## 2012-04-25 LAB — CBC WITH AUTOMATED DIFF
BASOPHILS: 0 % (ref 0–3)
EOSINOPHILS: 5 % (ref 0–5)
HCT: 36.1 % — ABNORMAL LOW (ref 37.0–50.0)
HGB: 11.6 gm/dl — ABNORMAL LOW (ref 12.4–17.2)
LYMPHOCYTES: 34 % (ref 28–48)
MCH: 30.4 pg (ref 23.0–34.6)
MCHC: 32.2 gm/dl (ref 30.0–36.0)
MCV: 94.6 fL (ref 80.0–98.0)
MONOCYTES: 13 % (ref 1–13)
MPV: 8.1 fL (ref 6.0–10.0)
NEUTROPHILS: 49 % (ref 34–64)
NRBC: 0 (ref 0–0)
PLATELET: 178 10*3/uL (ref 140–450)
RBC: 3.82 M/uL (ref 3.80–5.70)
RDW: 14.4 % — ABNORMAL HIGH (ref 11.5–14.0)
WBC: 5.1 10*3/uL (ref 4.0–11.0)

## 2012-04-25 LAB — BNP: BNP: 7.4 pg/ml (ref 0.0–99.0)

## 2012-04-25 LAB — POC URINE MICROSCOPIC

## 2012-04-25 LAB — MAGNESIUM: Magnesium: 1.8 mg/dl (ref 1.8–2.4)

## 2012-04-25 LAB — CKMB PROFILE
CK - MB: <0.5 ng/ml (ref 0.0–3.6)
CK-MB Index: 0.6 % (ref 0.0–4.9)
CK: 64 U/L (ref 39–308)

## 2012-04-25 LAB — T4, FREE: Free T4: 1.25 ng/dl (ref 0.76–1.46)

## 2012-04-25 LAB — TROPONIN I: Troponin-I: <0.015 ng/ml (ref 0.00–0.09)

## 2012-04-26 LAB — METABOLIC PANEL, BASIC
BUN: 8 mg/dl (ref 7–25)
CO2: 28 mEq/L (ref 21–32)
Calcium: 7.8 mg/dl — ABNORMAL LOW (ref 8.5–10.1)
Chloride: 107 mEq/L (ref 98–107)
Creatinine: 0.7 mg/dl (ref 0.6–1.3)
GFR est AA: >60
GFR est non-AA: >60
Glucose: 91 mg/dl (ref 74–106)
Potassium: 3.5 mEq/L (ref 3.5–5.1)
Sodium: 141 mEq/L (ref 136–145)

## 2012-04-26 LAB — URINALYSIS W/ RFLX MICROSCOPIC
Bilirubin: NEGATIVE
Bilirubin: NEGATIVE
Blood: NEGATIVE
Blood: NEGATIVE
Glucose: NEGATIVE mg/dl
Glucose: NEGATIVE mg/dl
Ketone: NEGATIVE mg/dl
Ketone: NEGATIVE mg/dl
Nitrites: NEGATIVE
Nitrites: NEGATIVE
Protein: NEGATIVE mg/dl
Protein: NEGATIVE mg/dl
Specific gravity: 1.015 (ref 1.005–1.030)
Specific gravity: 1.015 (ref 1.005–1.030)
Urobilinogen: 0.2 mg/dl (ref 0.0–1.0)
Urobilinogen: 0.2 mg/dl (ref 0.0–1.0)
pH (UA): 6 (ref 5.0–9.0)
pH (UA): 6 (ref 5.0–9.0)

## 2012-04-26 LAB — CORTISOL: Cortisol, random: 14.01 ug/dL

## 2012-04-26 LAB — TSH 3RD GENERATION: TSH: 2.21 u[IU]/mL (ref 0.358–3.740)
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
## 2012-05-13 DIAGNOSIS — IMO0001 Reserved for inherently not codable concepts without codable children: Secondary | ICD-10-CM | POA: Insufficient documentation

## 2012-10-02 LAB — POC URINE MACROSCOPIC
Bilirubin: NEGATIVE
Blood: NEGATIVE
Glucose: NEGATIVE mg/dl
Ketone: NEGATIVE mg/dl
Nitrites: NEGATIVE
Protein: NEGATIVE mg/dl
Specific gravity: 1.02 (ref 1.005–1.030)
Urobilinogen: 0.2 EU/dl (ref 0.0–1.0)
pH (UA): 7 (ref 5–9)

## 2012-10-02 LAB — CBC WITH AUTOMATED DIFF
BASOPHILS: 1 % (ref 0–3)
EOSINOPHILS: 1 % (ref 0–5)
HCT: 38 % (ref 37.0–50.0)
HGB: 12.6 gm/dl (ref 12.4–17.2)
LYMPHOCYTES: 16 % — ABNORMAL LOW (ref 28–48)
MCH: 31 pg (ref 23.0–34.6)
MCHC: 33.2 gm/dl (ref 30.0–36.0)
MCV: 93.5 fL (ref 80.0–98.0)
MONOCYTES: 9 % (ref 1–13)
MPV: 7.6 fL (ref 6.0–10.0)
NEUTROPHILS: 73 % — ABNORMAL HIGH (ref 34–64)
NRBC: 0 (ref 0–0)
PLATELET: 195 10*3/uL (ref 140–450)
RBC: 4.06 M/uL (ref 3.80–5.70)
RDW: 14 % (ref 11.5–14.0)
WBC: 6 10*3/uL (ref 4.0–11.0)

## 2012-10-02 LAB — CKMB PROFILE
CK - MB: <0.5 ng/ml (ref 0.0–3.6)
CK-MB Index: 0.6 % (ref 0.0–4.9)
CK: 66 U/L (ref 39–308)

## 2012-10-02 LAB — METABOLIC PANEL, COMPREHENSIVE
ALT (SGPT): 10 U/L — ABNORMAL LOW (ref 12–78)
AST (SGOT): 12 U/L — ABNORMAL LOW (ref 15–37)
Albumin: 3.5 gm/dl (ref 3.4–5.0)
Alk. phosphatase: 63 U/L (ref 50–136)
BUN: 13 mg/dl (ref 7–25)
Bilirubin, total: 0.7 mg/dl (ref 0.2–1.0)
CO2: 30 mEq/L (ref 21–32)
Calcium: 8.2 mg/dl — ABNORMAL LOW (ref 8.5–10.1)
Chloride: 108 mEq/L — ABNORMAL HIGH (ref 98–107)
Creatinine: 0.8 mg/dl (ref 0.6–1.3)
GFR est AA: >60
GFR est non-AA: >60
Glucose: 112 mg/dl — ABNORMAL HIGH (ref 74–106)
Potassium: 3.5 mEq/L (ref 3.5–5.1)
Protein, total: 6.5 gm/dl (ref 6.4–8.2)
Sodium: 143 mEq/L (ref 136–145)

## 2012-10-02 LAB — TROPONIN I
Troponin-I: <0.015 ng/ml (ref 0.00–0.09)
Troponin-I: <0.015 ng/ml (ref 0.00–0.09)

## 2012-10-02 LAB — POC URINE MICROSCOPIC

## 2012-10-02 LAB — GLUCOSE, POC: Glucose (POC): 98 (ref 65–105)

## 2012-10-02 LAB — CK-MB,QT: CK - MB: <0.5 ng/ml (ref 0.0–3.6)

## 2012-10-02 LAB — CK: CK: 67 U/L (ref 39–308)
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
## 2012-10-03 LAB — CK-MB,QT: CK - MB: <0.5 ng/ml (ref 0.0–3.6)

## 2012-10-03 LAB — TROPONIN I: Troponin-I: <0.015 ng/ml (ref 0.00–0.09)

## 2012-10-03 LAB — CK: CK: 65 U/L (ref 39–308)
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
## 2012-10-04 LAB — METABOLIC PANEL, BASIC
BUN: 10 mg/dl (ref 7–25)
CO2: 28 mEq/L (ref 21–32)
Calcium: 8.3 mg/dl — ABNORMAL LOW (ref 8.5–10.1)
Chloride: 107 mEq/L (ref 98–107)
Creatinine: 0.7 mg/dl (ref 0.6–1.3)
GFR est AA: >60
GFR est non-AA: >60
Glucose: 90 mg/dl (ref 74–106)
Potassium: 3.7 mEq/L (ref 3.5–5.1)
Sodium: 141 mEq/L (ref 136–145)

## 2012-10-04 LAB — CBC WITH AUTOMATED DIFF
BASOPHILS: 0 % (ref 0–3)
EOSINOPHILS: 3 % (ref 0–5)
HCT: 36.3 % — ABNORMAL LOW (ref 37.0–50.0)
HGB: 12 gm/dl — ABNORMAL LOW (ref 12.4–17.2)
LYMPHOCYTES: 31 % (ref 28–48)
MCH: 31.1 pg (ref 23.0–34.6)
MCHC: 33 gm/dl (ref 30.0–36.0)
MCV: 94.2 fL (ref 80.0–98.0)
MONOCYTES: 13 % (ref 1–13)
MPV: 7.8 fL (ref 6.0–10.0)
NEUTROPHILS: 53 % (ref 34–64)
NRBC: 0 (ref 0–0)
PLATELET: 179 10*3/uL (ref 140–450)
RBC: 3.85 M/uL (ref 3.80–5.70)
RDW: 13.8 % (ref 11.5–14.0)
WBC: 5.6 10*3/uL (ref 4.0–11.0)

## 2012-10-04 LAB — GLUCOSE, POC
Glucose (POC): 106 — ABNORMAL HIGH (ref 65–105)
Glucose (POC): 92 (ref 65–105)

## 2012-10-05 LAB — GLUCOSE, POC
Glucose (POC): 112 mg/dL — ABNORMAL HIGH (ref 65–105)
Glucose (POC): 88 mg/dL (ref 65–105)

## 2012-10-05 NOTE — Discharge Summary (Signed)
[facility]  Discharge Summary   NAME:  [name] Murillo, [name] Murillo  SEX:   M  ADMIT: 10/02/2012  DISCH:   DOB:   10-Aug-1955  MR#    [id number]  ACCT#  [id number]    cc: Mathews Argyle MD, Rozanna Box M.D.    DATE OF ADMISSION:   10/02/2012    DATE OF DISCHARGE:   10/05/2012    The patient is improved on discharge.  He should follow up with Dr. Octavio Manns   in about a week or two for an office visit and looking at if he is still   orthostatic at all.  He should also follow up with Dr. Mathews Argyle soon.  He   could not tolerate Flomax because of orthostatic hypotensive and wonder if he   might benefit from perhaps Proscar but I will leave that to Dr. Theodora Blow   expertise to do.  Diet should be his regular and activity should be his   regular.  He should restart his [facility].    DISCHARGE MEDICATIONS:  His discharge medications will include his Centrum Silver daily,  furosemide   20 a day, aspirin 81 mg a day, baclofen I guess he takes 20 at one time and 30   b.i.d., Colace daily 100 mg, lisinopril 5 a day, Prilosec 40 mg a day,   Vitamin D 1000 international units a day, tizanidine 4 mg t.i.d.  and that is   another thing probably contributes but I guess he needs it for spasticity,   Sinemet 25/100 a day, metoprolol 24-hour that is the succinate 50 mg daily and   that is usually given at bedtime, lidocaine patch to his shoulder.    DISCHARGE DIAGNOSES:  1.  Syncope, probably secondary to orthostasis, probably contributing by meds.  2. Hypotension, resolving.  3. History of traumatic brain injury which is stable.  4. Peripheral neuropathy with muscle spasms.  5. Gastroesophageal reflux disease.  6. Hypertension which again I just got him on his low dose medicines.  7. Chronic constipation.  8. Left shoulder pain.    Please see dictated H&P as dictated by Dr. Terrace Arabia for details.    HOSPITAL COURSE:  1. The patient was admitted to telemetry without significant arrhythmias.  It    looks like he probably was orthostatic and he is better.  The plan is to let   him go home, maybe try hose if that that does not work, to consider midodrine   but he should stay off the Flomax for now and please see discussion below. His   hypertension was okay. I am going to let him go back on low dose lisinopril   probably just because it good for him and that Lasix but need to keep an eye   on it.  We may want to make the Lasix p.r.n., but I will leave that to his   usual doctor to do.   2. Gastroesophageal reflux disease was stable on his PPI and I continued that.      3. Spasticity.  Unfortunately, these medicines contribute to his dizziness,   etc., but we will have to keep him on board.  3. Shoulder pain. The patch really helped. We will see how he does.  He did   not quality for PT at home, but will continue nursing. I hope he does okay.    4.  For his BPH, he will need to follow up with Dr. Verdie Mosher and consideration for  maybe Proscar might be of value, but I will leave that to Dr. Theodora Blow expertise.   I would stay away from the alpha blockers because of the orthostasis.  I hope   he does well.    Total time coordinating care for discharge 34 minutes.      ___________________  Ollen Bowl MD  Dictated By: .   [facility]  D:10/05/2012  T: 10/05/2012 10:41:23  [id number]  Authenticated by Omar Person, M.D. On 10/05/2012 01:33:45 PM

## 2014-10-16 ENCOUNTER — Emergency Department: Admit: 2014-10-16 | Payer: MEDICARE | Primary: Family Medicine

## 2014-10-16 ENCOUNTER — Inpatient Hospital Stay
Admit: 2014-10-16 | Discharge: 2014-10-16 | Disposition: A | Discharge status: Home / Self Care | Payer: MEDICARE | Attending: Emergency Medicine

## 2014-10-16 DIAGNOSIS — S0990XA Unspecified injury of head, initial encounter: Secondary | ICD-10-CM

## 2014-10-16 MED ORDER — DIPHTH,PERTUS(ACEL)TETANUS VAC(PF) 2 LF-(5-3-5MCG)-5 LF/0.5 ML IM SUSP
2 Lf-(.5-5-3-5 mcg)-5Lf/0.5 mL | Freq: Once | INTRAMUSCULAR | Status: AC
Start: 2014-10-16 — End: 2014-10-16
  Administered 2014-10-16: 16:00:00 via INTRAMUSCULAR

## 2014-10-16 MED FILL — ADACEL (TDAP ADOLESN/ADULT)(PF)2LF-(2.5-5-3-5MCG)-5 LF/0.5 ML IM SUSP: 2 Lf-(.5-5-3-5 mcg)-5Lf/0.5 mL | INTRAMUSCULAR | Qty: 0.5

## 2014-10-16 NOTE — ED Notes (Signed)
[name] Murillo is a 59 y.o. male that was discharged in good and improved condition.  The patients diagnosis, condition and treatment were explained to  patient and caregiver and aftercare instructions were given.  The patient and caregiver verbalized understanding. Patient armband removed and given to patient to take home.  Patient was informed of the privacy risks if armband lost or stolen.
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
## 2015-08-21 ENCOUNTER — Emergency Department: Admit: 2015-08-21 | Payer: MEDICARE | Primary: Family Medicine

## 2015-08-21 ENCOUNTER — Inpatient Hospital Stay
Admit: 2015-08-21 | Discharge: 2015-08-21 | Disposition: A | Discharge status: Home / Self Care | Payer: MEDICARE | Attending: Emergency Medicine

## 2015-08-21 DIAGNOSIS — B349 Viral infection, unspecified: Secondary | ICD-10-CM

## 2015-08-21 LAB — METABOLIC PANEL, BASIC
BUN: 12 mg/dl (ref 7–25)
CO2: 30 mEq/L (ref 21–32)
Calcium: 8.4 mg/dl — ABNORMAL LOW (ref 8.5–10.1)
Chloride: 101 mEq/L (ref 98–107)
Creatinine: 0.7 mg/dl (ref 0.6–1.3)
GFR est AA: >60
GFR est non-AA: >60
Glucose: 97 mg/dl (ref 74–106)
Potassium: 3.5 mEq/L (ref 3.5–5.1)
Sodium: 139 mEq/L (ref 136–145)

## 2015-08-21 LAB — CBC WITH AUTOMATED DIFF
BASOPHILS: 0.5 % (ref 0–3)
EOSINOPHILS: 0.5 % (ref 0–5)
HCT: 38.9 % (ref 37.0–50.0)
HGB: 13.2 gm/dl (ref 12.4–17.2)
IMMATURE GRANULOCYTES: 0.5 % (ref 0.0–3.0)
LYMPHOCYTES: 5.1 % — ABNORMAL LOW (ref 28–48)
MCH: 30.6 pg (ref 23.0–34.6)
MCHC: 33.9 gm/dl (ref 30.0–36.0)
MCV: 90.3 fL (ref 80.0–98.0)
MONOCYTES: 16.6 % — ABNORMAL HIGH (ref 1–13)
MPV: 9.3 fL (ref 6.0–10.0)
NEUTROPHILS: 76.8 % — ABNORMAL HIGH (ref 34–64)
NRBC: 0 (ref 0–0)
PLATELET: 167 10*3/uL (ref 140–450)
RBC: 4.31 M/uL (ref 3.80–5.70)
RDW-SD: 44 — ABNORMAL HIGH (ref 35.1–43.9)
WBC: 4.4 10*3/uL (ref 4.0–11.0)

## 2015-08-21 LAB — POC URINE MACROSCOPIC
Bilirubin: NEGATIVE
Blood: NEGATIVE
Glucose: NEGATIVE mg/dl
Leukocyte Esterase: NEGATIVE
Nitrites: NEGATIVE
Protein: NEGATIVE mg/dl
Specific gravity: 1.02 (ref 1.005–1.030)
Urobilinogen: 0.2 EU/dl (ref 0.0–1.0)
pH (UA): 6.5 (ref 5–9)

## 2015-08-21 MED ORDER — OSELTAMIVIR PHOSPHATE 75 MG CAP
75 mg | ORAL_CAPSULE | Freq: Two times a day (BID) | ORAL | 0 refills | Status: DC
Start: 2015-08-21 — End: 2015-08-26

## 2015-08-21 MED ORDER — ACETAMINOPHEN 325 MG TABLET
325 mg | ORAL | Status: AC
Start: 2015-08-21 — End: 2015-08-21
  Administered 2015-08-21: 20:00:00 via ORAL

## 2015-08-21 MED ORDER — IBUPROFEN 400 MG TAB
400 mg | ORAL | Status: AC
Start: 2015-08-21 — End: 2015-08-21
  Administered 2015-08-21: 14:00:00 via ORAL

## 2015-08-21 MED ORDER — SODIUM CHLORIDE 0.9% BOLUS IV
0.9 % | Freq: Once | INTRAVENOUS | Status: AC
Start: 2015-08-21 — End: 2015-08-21
  Administered 2015-08-21: 14:00:00 via INTRAVENOUS

## 2015-08-21 MED ORDER — SODIUM CHLORIDE 0.9 % IJ SYRG
Freq: Once | INTRAMUSCULAR | Status: AC
Start: 2015-08-21 — End: 2015-08-21
  Administered 2015-08-21: 14:00:00 via INTRAVENOUS

## 2015-08-21 MED FILL — IBUPROFEN 400 MG TAB: 400 mg | ORAL | Qty: 2

## 2015-08-21 MED FILL — ACETAMINOPHEN 325 MG TABLET: 325 mg | ORAL | Qty: 2
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
## 2015-08-21 NOTE — ED Notes (Signed)
3:04 PM  08/21/15     Discharge instructions given to patient's sister (name) with verbalization of understanding. Patient accompanied by transport team.  Patient discharged with the following prescriptions tamiflu. Patient discharged to home (destination).      Valene Bors, RN

## 2015-08-23 ENCOUNTER — Inpatient Hospital Stay
Admit: 2015-08-23 | Discharge: 2015-08-26 | Disposition: A | Discharge status: Home / Self Care | Payer: MEDICARE | Attending: Internal Medicine | Admitting: Internal Medicine

## 2015-08-23 ENCOUNTER — Emergency Department: Admit: 2015-08-23 | Payer: MEDICARE | Primary: Family Medicine

## 2015-08-23 DIAGNOSIS — A419 Sepsis, unspecified organism: Secondary | ICD-10-CM

## 2015-08-23 LAB — CBC WITH AUTOMATED DIFF
BASOPHILS: 0.4 % (ref 0–3)
EOSINOPHILS: 0 % (ref 0–5)
HCT: 37.4 % (ref 37.0–50.0)
HGB: 12.5 gm/dl (ref 12.4–17.2)
IMMATURE GRANULOCYTES: 0.3 % (ref 0.0–3.0)
LYMPHOCYTES: 11.7 % — ABNORMAL LOW (ref 28–48)
MCH: 30.6 pg (ref 23.0–34.6)
MCHC: 33.4 gm/dl (ref 30.0–36.0)
MCV: 91.4 fL (ref 80.0–98.0)
MONOCYTES: 8.3 % (ref 1–13)
MPV: 9.5 fL (ref 6.0–10.0)
NEUTROPHILS: 79.3 % — ABNORMAL HIGH (ref 34–64)
NRBC: 0 (ref 0–0)
PLATELET: 127 10*3/uL — ABNORMAL LOW (ref 140–450)
RBC: 4.09 M/uL (ref 3.80–5.70)
RDW-SD: 46.2 — ABNORMAL HIGH (ref 35.1–43.9)
WBC: 7.2 10*3/uL (ref 4.0–11.0)

## 2015-08-23 LAB — POC URINE MICROSCOPIC

## 2015-08-23 LAB — LACTIC ACID
Lactic Acid: 1.9 mmol/L (ref 0.4–2.0)
Lactic Acid: 1.5 mmol/L (ref 0.4–2.0)

## 2015-08-23 LAB — INFLUENZA A/B, BY PCR
H1N1 by PCR: NOT DETECTED
Influenza A PCR: POSITIVE — AB
Influenza B PCR: NEGATIVE

## 2015-08-23 LAB — PROCALCITONIN: PROCALCITONIN: 0.2 ng/ml (ref 0.00–0.50)

## 2015-08-23 LAB — METABOLIC PANEL, COMPREHENSIVE
ALT (SGPT): 15 U/L (ref 12–78)
AST (SGOT): 21 U/L (ref 15–37)
Albumin: 3 gm/dl — ABNORMAL LOW (ref 3.4–5.0)
Alk. phosphatase: 61 U/L (ref 45–117)
BUN: 13 mg/dl (ref 7–25)
Bilirubin, total: 0.4 mg/dl (ref 0.2–1.0)
CO2: 27 mEq/L (ref 21–32)
Calcium: 7.7 mg/dl — ABNORMAL LOW (ref 8.5–10.1)
Chloride: 107 mEq/L (ref 98–107)
Creatinine: 0.9 mg/dl (ref 0.6–1.3)
GFR est AA: >60
GFR est non-AA: >60
Glucose: 108 mg/dl — ABNORMAL HIGH (ref 74–106)
Potassium: 3.3 mEq/L — ABNORMAL LOW (ref 3.5–5.1)
Protein, total: 6.4 gm/dl (ref 6.4–8.2)
Sodium: 142 mEq/L (ref 136–145)

## 2015-08-23 LAB — URINALYSIS W/ RFLX MICROSCOPIC
Glucose: NEGATIVE mg/dl
Ketone: 15 mg/dl — AB
Leukocyte Esterase: NEGATIVE
Nitrites: NEGATIVE
Protein: 30 mg/dl — AB
Specific gravity: >=1.03 (ref 1.005–1.030)
Urobilinogen: 0.2 mg/dl (ref 0.0–1.0)
pH (UA): 5.5 (ref 5.0–9.0)

## 2015-08-23 LAB — GLUCOSE, POC: Glucose (POC): 111 mg/dL — ABNORMAL HIGH (ref 65–105)

## 2015-08-23 MED ORDER — DOCUSATE SODIUM 100 MG CAP
100 mg | Freq: Every day | ORAL | Status: DC
Start: 2015-08-23 — End: 2015-08-26
  Administered 2015-08-24 – 2015-08-26 (×3): via ORAL

## 2015-08-23 MED ORDER — VANCOMYCIN IN 0.9 % SODIUM CHLORIDE 1.25 GRAM/250 ML IV
1.25 gram/250 mL | Freq: Three times a day (TID) | INTRAVENOUS | Status: DC
Start: 2015-08-23 — End: 2015-08-24
  Administered 2015-08-24 (×2): via INTRAVENOUS

## 2015-08-23 MED ORDER — PHARMACY VANCOMYCIN NOTE
Freq: Once | Status: AC
Start: 2015-08-23 — End: 2015-08-24
  Administered 2015-08-24: 20:00:00

## 2015-08-23 MED ORDER — SODIUM CHLORIDE 0.9 % IV
10 gram | Freq: Once | INTRAVENOUS | Status: DC
Start: 2015-08-23 — End: 2015-08-23
  Administered 2015-08-23: 17:00:00 via INTRAVENOUS

## 2015-08-23 MED ORDER — ESOMEPRAZOLE MAGNESIUM 20 MG CAP, DELAYED RELEASE
20 mg | Freq: Every day | ORAL | Status: DC
Start: 2015-08-23 — End: 2015-08-26
  Administered 2015-08-24 – 2015-08-26 (×3): via ORAL

## 2015-08-23 MED ORDER — ACETAMINOPHEN 325 MG TABLET
325 mg | ORAL | Status: DC | PRN
Start: 2015-08-23 — End: 2015-08-26

## 2015-08-23 MED ORDER — DIAZEPAM 2 MG TAB
2 mg | Freq: Two times a day (BID) | ORAL | Status: DC | PRN
Start: 2015-08-23 — End: 2015-08-26

## 2015-08-23 MED ORDER — SODIUM CHLORIDE 0.9% BOLUS IV
0.9 % | INTRAVENOUS | Status: DC | PRN
Start: 2015-08-23 — End: 2015-08-26
  Administered 2015-08-23: 16:00:00 via INTRAVENOUS

## 2015-08-23 MED ORDER — PHARMACY VANCOMYCIN NOTE
Status: DC
Start: 2015-08-23 — End: 2015-08-26

## 2015-08-23 MED ORDER — IPRATROPIUM-ALBUTEROL 2.5 MG-0.5 MG/3 ML NEB SOLUTION
2.5 mg-0.5 mg/3 ml | Freq: Four times a day (QID) | RESPIRATORY_TRACT | Status: DC | PRN
Start: 2015-08-23 — End: 2015-08-26

## 2015-08-23 MED ORDER — DULOXETINE 30 MG CAP, DELAYED RELEASE
30 mg | Freq: Every day | ORAL | Status: DC
Start: 2015-08-23 — End: 2015-08-26
  Administered 2015-08-24 – 2015-08-26 (×3): via ORAL

## 2015-08-23 MED ORDER — SODIUM CHLORIDE 0.9 % IV
INTRAVENOUS | Status: AC
Start: 2015-08-23 — End: 2015-08-23
  Administered 2015-08-23: 23:00:00 via INTRAVENOUS

## 2015-08-23 MED ORDER — TIZANIDINE 4 MG TAB
4 mg | Freq: Three times a day (TID) | ORAL | Status: DC
Start: 2015-08-23 — End: 2015-08-26
  Administered 2015-08-24 – 2015-08-26 (×8): via ORAL

## 2015-08-23 MED ORDER — PIPERACILLIN-TAZOBACTAM 4.5 GRAM IV SOLR
4.5 gram | Freq: Once | INTRAVENOUS | Status: AC
Start: 2015-08-23 — End: 2015-08-23
  Administered 2015-08-23: 18:00:00 via INTRAVENOUS

## 2015-08-23 MED ORDER — VANCOMYCIN IN 0.9 % SODIUM CHLORIDE 2 GRAM/500 ML IV
2 gram/500 mL | Freq: Once | INTRAVENOUS | Status: AC
Start: 2015-08-23 — End: 2015-08-26
  Administered 2015-08-23: 20:00:00 via INTRAVENOUS

## 2015-08-23 MED ORDER — METOPROLOL TARTRATE 25 MG TAB
25 mg | Freq: Two times a day (BID) | ORAL | Status: DC
Start: 2015-08-23 — End: 2015-08-26
  Administered 2015-08-24 – 2015-08-26 (×6): via ORAL

## 2015-08-23 MED ORDER — AZITHROMYCIN 500 MG IV SOLUTION
500 mg | INTRAVENOUS | Status: DC
Start: 2015-08-23 — End: 2015-08-26
  Administered 2015-08-23 – 2015-08-25 (×3): via INTRAVENOUS

## 2015-08-23 MED ORDER — BACLOFEN 10 MG TAB
10 mg | Freq: Every day | ORAL | Status: DC
Start: 2015-08-23 — End: 2015-08-26
  Administered 2015-08-24 – 2015-08-25 (×2): via ORAL

## 2015-08-23 MED ORDER — SODIUM CHLORIDE 0.9 % IV PIGGY BACK
3.375 gram | Freq: Three times a day (TID) | INTRAVENOUS | Status: DC
Start: 2015-08-23 — End: 2015-08-26
  Administered 2015-08-24 – 2015-08-26 (×8): via INTRAVENOUS

## 2015-08-23 MED ORDER — MULTIVITAMIN ORAL LIQUID
Freq: Every day | ORAL | Status: DC
Start: 2015-08-23 — End: 2015-08-26
  Administered 2015-08-24 – 2015-08-26 (×3): via ORAL

## 2015-08-23 MED ORDER — CHOLECALCIFEROL (VITAMIN D3) 1,000 UNIT (25 MCG) TAB
Freq: Every day | ORAL | Status: DC
Start: 2015-08-23 — End: 2015-08-26
  Administered 2015-08-24 – 2015-08-26 (×3): via ORAL

## 2015-08-23 MED ORDER — ASPIRIN 81 MG TAB, DELAYED RELEASE
81 mg | Freq: Every day | ORAL | Status: DC
Start: 2015-08-23 — End: 2015-08-26
  Administered 2015-08-24 – 2015-08-26 (×3): via ORAL

## 2015-08-23 MED ORDER — SODIUM CHLORIDE 0.9% BOLUS IV
0.9 % | INTRAVENOUS | Status: AC
Start: 2015-08-23 — End: 2015-08-23
  Administered 2015-08-23: 18:00:00 via INTRAVENOUS

## 2015-08-23 MED ORDER — OSELTAMIVIR PHOSPHATE 75 MG CAP
75 mg | Freq: Two times a day (BID) | ORAL | Status: AC
Start: 2015-08-23 — End: 2015-08-26
  Administered 2015-08-24 – 2015-08-26 (×6): via ORAL

## 2015-08-23 MED ORDER — BACLOFEN 10 MG TAB
10 mg | Freq: Two times a day (BID) | ORAL | Status: DC
Start: 2015-08-23 — End: 2015-08-26
  Administered 2015-08-24 – 2015-08-26 (×6): via ORAL

## 2015-08-23 MED FILL — VANCOMYCIN IN 0.9 % SODIUM CHLORIDE 2 GRAM/500 ML IV: 2 gram/500 mL | INTRAVENOUS | Qty: 500

## 2015-08-23 MED FILL — SODIUM CHLORIDE 0.9 % IV: INTRAVENOUS | Qty: 3000

## 2015-08-23 MED FILL — TAMIFLU 75 MG CAPSULE: 75 mg | ORAL | Qty: 1

## 2015-08-23 MED FILL — AZITHROMYCIN 500 MG IV SOLUTION: 500 mg | INTRAVENOUS | Qty: 5

## 2015-08-23 MED FILL — VANCOMYCIN IN 0.9 % SODIUM CHLORIDE 1.25 GRAM/250 ML IV: 1.25 gram/250 mL | INTRAVENOUS | Qty: 250

## 2015-08-23 MED FILL — PHARMACY VANCOMYCIN NOTE: Qty: 1

## 2015-08-23 MED FILL — SODIUM CHLORIDE 0.9 % IV PIGGY BACK: INTRAVENOUS | Qty: 100

## 2015-08-23 MED FILL — SODIUM CHLORIDE 0.9 % IV: INTRAVENOUS | Qty: 1000

## 2015-08-23 NOTE — ED Triage Notes (Signed)
BIB EMS for hypotension; Pt was in ED on Wednesday for flu like symptoms and was discharged.  Sister states that patient was going in and out of consciousness today.  BP 69 systolic.  Liter of NS given; BP 91 systolic.  AOx3  BS 217

## 2015-08-23 NOTE — Other (Signed)
----------  DocumentID:   [id number]------------------------------------------------              [facility]                       Patient Education Report         Name: KARSON, REEDE                  Date: 08/23/2015    MRN: [id number]                    Time: 2:21:18 PM         Patient ordered video: Patient Safety: Stay Safe While you are in the   Hospital    from [id number] via phone number: [phone] at 2:21:18 PM

## 2015-08-23 NOTE — ED Notes (Signed)
TRANSFER - OUT REPORT:    Verbal report given to Jessica, RN(name) on [name] Murillo  being transferred to 6west(unit) for routine progression of care       Report consisted of patient???s Situation, Background, Assessment and   Recommendations(SBAR).     Information from the following report(s) SBAR, Kardex, MAR, Recent Results and Cardiac Rhythm NSR was reviewed with the receiving nurse.    Lines:   Peripheral IV 08/23/15 Right Forearm (Active)   Site Assessment Clean, dry, & intact 08/23/2015 11:49 AM   Phlebitis Assessment 0 08/23/2015 11:49 AM   Infiltration Assessment 0 08/23/2015 11:49 AM   Dressing Status Clean, dry, & intact 08/23/2015 11:49 AM   Hub Color/Line Status Green 08/23/2015 11:49 AM   Alcohol Cap Used Yes 08/23/2015 11:49 AM        Opportunity for questions and clarification was provided.      Patient transported with:   [employer]
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
## 2015-08-23 NOTE — Other (Signed)
Bedside and Verbal shift change report given to Michaela Miles RN (oncoming nurse) by Jessica RN (offgoing nurse). Report included the following information SBAR, Kardex, Procedure Summary, Intake/Output, MAR and Recent Results.

## 2015-08-23 NOTE — Progress Notes (Signed)
[facility] Dosing Services:  [facility]    Pharmacy consulted by Dr. Sherlon Handing to dose vanc for this 60 y.o. male with suspected pneumonia and possible sepsis    Ht Readings from Last 1 Encounters:   08/21/15 193 cm (76")        Wt Readings from Last 1 Encounters:   08/23/15 99.8 kg (220 lb)        Other Current Antibiotics Zosyn, Azithromycin   Significant Cultures Influenza A positive  Blood cultures obtained   Serum Creatinine Lab Results   Component Value Date/Time    Creatinine 0.9 08/23/2015 11:09 AM      Creatinine Clearance Estimated Creatinine Clearance: 109.8 mL/min (based on Cr of 0.9).   BUN Lab Results   Component Value Date/Time    BUN 13 08/23/2015 11:09 AM      WBC Lab Results   Component Value Date/Time    WBC 7.2 08/23/2015 11:09 AM      Temp 98 ??F (36.7 ??C)     Loading dose of 2000 mg IV sent to ED.  Ordered maintenance dose of 1250 mg IV q8hr.  Ordered trough prior to 4th dose of vanc, will adjust to maintain within goal of 15 - 20.    Thank you,  Shaaron Adler, PHARMD

## 2015-08-24 LAB — LACTIC ACID
Lactic Acid: 0.5 mmol/L (ref 0.4–2.0)
Lactic Acid: 1.1 mmol/L (ref 0.4–2.0)

## 2015-08-24 LAB — VANCOMYCIN, TROUGH: Vancomycin,trough: 11.9 ug/mL — ABNORMAL LOW (ref 15.0–20.0)

## 2015-08-24 MED ORDER — VANCOMYCIN IN 0.9% SODIUM CHLORIDE 1.5 G/500 ML IV
1.5 g/500 mL | Freq: Three times a day (TID) | INTRAVENOUS | Status: DC
Start: 2015-08-24 — End: 2015-08-26
  Administered 2015-08-24 – 2015-08-26 (×6): via INTRAVENOUS

## 2015-08-24 MED ORDER — PHARMACY VANCOMYCIN NOTE
Freq: Once | Status: AC
Start: 2015-08-24 — End: 2015-08-26
  Administered 2015-08-26: 12:00:00

## 2015-08-24 MED FILL — ESOMEPRAZOLE MAGNESIUM 20 MG CAP, DELAYED RELEASE: 20 mg | ORAL | Qty: 2

## 2015-08-24 MED FILL — OSELTAMIVIR PHOSPHATE 75 MG CAP: 75 mg | ORAL | Qty: 1

## 2015-08-24 MED FILL — DULOXETINE 30 MG CAP, DELAYED RELEASE: 30 mg | ORAL | Qty: 1

## 2015-08-24 MED FILL — VANCOMYCIN IN 0.9 % SODIUM CHLORIDE 1.25 GRAM/250 ML IV: 1.25 gram/250 mL | INTRAVENOUS | Qty: 250

## 2015-08-24 MED FILL — PIPERACILLIN-TAZOBACTAM 3.375 GRAM IV SOLR: 3.375 gram | INTRAVENOUS | Qty: 3.38

## 2015-08-24 MED FILL — PHARMACY VANCOMYCIN NOTE: Qty: 1

## 2015-08-24 MED FILL — BACLOFEN 10 MG TAB: 10 mg | ORAL | Qty: 4

## 2015-08-24 MED FILL — ASPIRIN 81 MG TAB, DELAYED RELEASE: 81 mg | ORAL | Qty: 1

## 2015-08-24 MED FILL — METOPROLOL TARTRATE 25 MG TAB: 25 mg | ORAL | Qty: 1

## 2015-08-24 MED FILL — DOCUSATE SODIUM 100 MG CAP: 100 mg | ORAL | Qty: 1

## 2015-08-24 MED FILL — AZITHROMYCIN 500 MG IV SOLUTION: 500 mg | INTRAVENOUS | Qty: 5

## 2015-08-24 MED FILL — BACLOFEN 10 MG TAB: 10 mg | ORAL | Qty: 2

## 2015-08-24 MED FILL — TIZANIDINE 4 MG TAB: 4 mg | ORAL | Qty: 1

## 2015-08-24 MED FILL — SODIUM CHLORIDE 0.9 % IV PIGGY BACK: INTRAVENOUS | Qty: 100

## 2015-08-24 MED FILL — VANCOMYCIN IN 0.9% SODIUM CHLORIDE 1.5 G/500 ML IV: 1.5 g/500 mL | INTRAVENOUS | Qty: 500

## 2015-08-24 MED FILL — MULTIVITAMIN ORAL LIQUID: ORAL | Qty: 5

## 2015-08-24 MED FILL — VITAMIN D3 25 MCG (1,000 UNIT) TABLET: 25 mcg (1,000 unit) | ORAL | Qty: 1

## 2015-08-24 NOTE — Other (Signed)
Bedside and Verbal shift change report given to Jessica (oncoming nurse) by Michaela (offgoing nurse). Report included the following information SBAR, Kardex, Intake/Output, MAR and Recent Results.

## 2015-08-24 NOTE — Progress Notes (Signed)
Medicine Progress Note    Patient: [name] Murillo   Age:  60 y.o.  DOA: 08/23/2015   Admit Dx / CC: Pneumonia  Hypotension  Dehydration  Sepsis (HCC)  LOS:  LOS: 1 day     Assessment/Plan   Active Problems:    Dehydration (08/23/2015)      Pneumonia (08/23/2015)      Hypotension (08/23/2015)      Sepsis (HCC) (08/23/2015)      Influenza (08/23/2015)      Overview: A      ??  Plan   - vanc, zosyn, azithromycin  - nebs  - tamiflu  - o2 prn  ??  - IVF  ??  BP better now, monitor  ??  Check LA      Anticipated Date of Discharge: 1-2 days  Anticipated Disposition (home, SNF) : home with home health    Subjective:   Patient seen and examined. No complaints, No N/V, F/C, CP or SOB. Feeling much better today    Objective:     Visit Vitals   ??? BP 150/81 (BP 1 Location: Left arm, BP Patient Position: Supine)   ??? Pulse 75   ??? Temp 98.3 ??F (36.8 ??C)   ??? Resp 16   ??? Wt 99.8 kg (220 lb)   ??? SpO2 97%   ??? BMI 26.78 kg/m2       Physical Exam:  General appearance: alert, cooperative, no distress, appears stated age  Head: Normocephalic, without obvious abnormality, atraumatic  Neck: supple, trachea midline  Lungs: clear to auscultation bilaterally  Heart: regular rate and rhythm, S1, S2 normal, no murmur, click, rub or gallop  Abdomen: soft, non-tender. Bowel sounds normal. No masses,  no organomegaly  Extremities: extremities normal, atraumatic, no cyanosis or edema  Skin: Skin color, texture, turgor normal. No rashes or lesions  Neurologic: Grossly normal    Intake and Output:  Current Shift:     Last three shifts:  01/26 1901 - 01/28 0700  In: 2316.6 [P.O.:320; I.V.:1496.6]  Out: -     Lab/Data Reviewed:  BMP: No results found for: NA, K, CL, CO2, AGAP, GLU, BUN, CREA, GFRAA, GFRNA  CBC: No results found for: WBC, HGB, HGBEXT, HCT, HCTEXT, PLT, PLTEXT, HGBEXT, HCTEXT, PLTEXT        Medications Reviewed:  Current Facility-Administered Medications   Medication Dose Route Frequency    ??? sodium chloride 0.9 % bolus infusion 2,994 mL  30 mL/kg IntraVENous PRN   ??? piperacillin-tazobactam (ZOSYN) 3.375 g in 0.9% sodium chloride (MBP/ADV) 100 mL MBP  3.375 g IntraVENous Q8H   ??? azithromycin (ZITHROMAX) 500 mg in 0.9% sodium chloride 250 mL IVPB  500 mg IntraVENous Q24H   ??? *Pharmacy Dosing Vancomycin  1 Each Other Rx Dosing/Monitoring   ??? vancomycin (VANCOCIN) 1250 mg in NS 250 ml infusion  1,250 mg IntraVENous Q8H   ??? Vancomycin Trough Due   Other ONCE   ??? metoprolol tartrate (LOPRESSOR) tablet 12.5 mg  12.5 mg Oral BID   ??? multivitamin (MULTI-DELYN, WELLESSE) oral liquid 5 mL  5 mL Oral DAILY   ??? baclofen (LIORESAL) tablet 40 mg  40 mg Oral ACB/HS   ??? aspirin delayed-release tablet 81 mg  81 mg Oral DAILY   ??? docusate sodium (COLACE) capsule 100 mg  100 mg Oral DAILY   ??? cholecalciferol (VITAMIN D3) tablet 1,000 Units  1,000 Units Oral DAILY   ??? esomeprazole (NEXIUM) capsule 40 mg  40 mg Oral DAILY   ???  DULoxetine (CYMBALTA) capsule 30 mg  30 mg Oral DAILY   ??? acetaminophen (TYLENOL) tablet 325 mg  325 mg Oral Q4H PRN   ??? tiZANidine (ZANAFLEX) tablet 4 mg  4 mg Oral TID   ??? oseltamivir (TAMIFLU) capsule 75 mg  75 mg Oral BID   ??? diazePAM (VALIUM) tablet 2 mg  2 mg Oral Q12H PRN   ??? baclofen (LIORESAL) tablet 20 mg  20 mg Oral PCL   ??? albuterol-ipratropium (DUO-NEB) 2.5 MG-0.5 MG/3 ML  3 mL Nebulization Q6H PRN       Pamelia Hoit, MD  P# [phone]  August 24, 2015

## 2015-08-24 NOTE — Other (Signed)
Bedside and Verbal shift change report given to Michaela (oncoming nurse) by Jessica (offgoing nurse). Report included the following information SBAR, Kardex, Intake/Output, MAR and Recent Results.

## 2015-08-24 NOTE — Progress Notes (Signed)
Vanc consult: trough today 11.9, called rn, will change to 1500 mg iv q8h (from 1250 mg iv q8) to incr trough to goal range 15-20.  Ordered next trough for 1/30 am, pre dose.  PNA, sepsis in 60 yo M pt, scr stable, crcl >100.

## 2015-08-24 NOTE — Progress Notes (Signed)
PAGER ID: [phone]   MESSAGE: Room [phone] does not have telemetry orders and family is requesting PT for the patient   Shanda Bumps [phone]

## 2015-08-24 NOTE — Other (Signed)
Bedside and Verbal shift change report given to Michaela Miles RN (oncoming nurse) by Jessica RN (offgoing nurse). Report included the following information SBAR, Kardex, Procedure Summary, Intake/Output, MAR and Recent Results.

## 2015-08-25 LAB — CULTURE, URINE
CULTURE RESULT: NO GROWTH
Culture result: NO GROWTH

## 2015-08-25 MED FILL — OSELTAMIVIR PHOSPHATE 75 MG CAP: 75 mg | ORAL | Qty: 1

## 2015-08-25 MED FILL — MULTIVITAMIN ORAL LIQUID: ORAL | Qty: 5

## 2015-08-25 MED FILL — ASPIRIN 81 MG TAB, DELAYED RELEASE: 81 mg | ORAL | Qty: 1

## 2015-08-25 MED FILL — VITAMIN D3 25 MCG (1,000 UNIT) TABLET: 25 mcg (1,000 unit) | ORAL | Qty: 1

## 2015-08-25 MED FILL — VANCOMYCIN IN 0.9% SODIUM CHLORIDE 1.5 G/500 ML IV: 1.5 g/500 mL | INTRAVENOUS | Qty: 500

## 2015-08-25 MED FILL — DOCUSATE SODIUM 100 MG CAP: 100 mg | ORAL | Qty: 1

## 2015-08-25 MED FILL — TIZANIDINE 4 MG TAB: 4 mg | ORAL | Qty: 1

## 2015-08-25 MED FILL — DULOXETINE 30 MG CAP, DELAYED RELEASE: 30 mg | ORAL | Qty: 1

## 2015-08-25 MED FILL — METOPROLOL TARTRATE 25 MG TAB: 25 mg | ORAL | Qty: 1

## 2015-08-25 MED FILL — AZITHROMYCIN 500 MG IV SOLUTION: 500 mg | INTRAVENOUS | Qty: 5

## 2015-08-25 MED FILL — SODIUM CHLORIDE 0.9 % IV PIGGY BACK: INTRAVENOUS | Qty: 100

## 2015-08-25 MED FILL — BACLOFEN 10 MG TAB: 10 mg | ORAL | Qty: 4

## 2015-08-25 MED FILL — ESOMEPRAZOLE MAGNESIUM 20 MG CAP, DELAYED RELEASE: 20 mg | ORAL | Qty: 2

## 2015-08-25 MED FILL — PIPERACILLIN-TAZOBACTAM 3.375 GRAM IV SOLR: 3.375 gram | INTRAVENOUS | Qty: 3.38

## 2015-08-25 MED FILL — BACLOFEN 10 MG TAB: 10 mg | ORAL | Qty: 2

## 2015-08-25 NOTE — Progress Notes (Signed)
Medicine Progress Note    Patient: [name] Murillo   Age:  60 y.o.  DOA: 08/23/2015   Admit Dx / CC: Pneumonia  Hypotension  Dehydration  Sepsis (HCC)  LOS:  LOS: 2 days     Assessment/Plan   Active Problems:    Dehydration (08/23/2015)      Pneumonia (08/23/2015)      Hypotension (08/23/2015)      Sepsis (HCC) (08/23/2015)      Influenza (08/23/2015)      Overview: A      ??  Plan   - vanc, zosyn, azithromycin  - nebs  - tamiflu  - o2 prn  ??  - IVF  ??  BP better now, monitor  ??  Check LA      Anticipated Date of Discharge: possibly tomorrow on po abx  Anticipated Disposition (home, SNF) : home with home health    Subjective:   Patient seen and examined. No complaints, No N/V, F/C, CP or SOB. Feeling much better today    Objective:     Visit Vitals   ??? BP 146/90   ??? Pulse (!) 52   ??? Temp 97.7 ??F (36.5 ??C)   ??? Resp 18   ??? Wt 99.8 kg (220 lb)   ??? SpO2 100%   ??? BMI 26.78 kg/m2       Physical Exam:  General appearance: alert, cooperative, no distress, appears stated age  Head: Normocephalic, without obvious abnormality, atraumatic  Neck: supple, trachea midline  Lungs: clear to auscultation bilaterally  Heart: regular rate and rhythm, S1, S2 normal, no murmur, click, rub or gallop  Abdomen: soft, non-tender. Bowel sounds normal. No masses,  no organomegaly  Extremities: extremities normal, atraumatic, no cyanosis or edema  Skin: Skin color, texture, turgor normal. No rashes or lesions  Neurologic: Grossly normal    Intake and Output:  Current Shift:  01/29 0701 - 01/29 1900  In: 1060 [P.O.:360; I.V.:700]  Out: 600 [Urine:600]  Last three shifts:  01/27 1901 - 01/29 0700  In: 2822 [P.O.:240; I.V.:2582]  Out: 400 [Urine:400]    Lab/Data Reviewed:  BMP: No results found for: NA, K, CL, CO2, AGAP, GLU, BUN, CREA, GFRAA, GFRNA  CBC: No results found for: WBC, HGB, HGBEXT, HCT, HCTEXT, PLT, PLTEXT, HGBEXT, HCTEXT, PLTEXT        Medications Reviewed:  Current Facility-Administered Medications   Medication Dose Route Frequency    ??? vancomycin (VANCOCIN) 1500 mg in NS 500 ml infusion  1,500 mg IntraVENous Q8H   ??? [START ON 08/26/2015] vanc trough due    Other ONCE   ??? sodium chloride 0.9 % bolus infusion 2,994 mL  30 mL/kg IntraVENous PRN   ??? piperacillin-tazobactam (ZOSYN) 3.375 g in 0.9% sodium chloride (MBP/ADV) 100 mL MBP  3.375 g IntraVENous Q8H   ??? azithromycin (ZITHROMAX) 500 mg in 0.9% sodium chloride 250 mL IVPB  500 mg IntraVENous Q24H   ??? *Pharmacy Dosing Vancomycin  1 Each Other Rx Dosing/Monitoring   ??? metoprolol tartrate (LOPRESSOR) tablet 12.5 mg  12.5 mg Oral BID   ??? multivitamin (MULTI-DELYN, WELLESSE) oral liquid 5 mL  5 mL Oral DAILY   ??? baclofen (LIORESAL) tablet 40 mg  40 mg Oral ACB/HS   ??? aspirin delayed-release tablet 81 mg  81 mg Oral DAILY   ??? docusate sodium (COLACE) capsule 100 mg  100 mg Oral DAILY   ??? cholecalciferol (VITAMIN D3) tablet 1,000 Units  1,000 Units Oral DAILY   ??? esomeprazole (  NEXIUM) capsule 40 mg  40 mg Oral DAILY   ??? DULoxetine (CYMBALTA) capsule 30 mg  30 mg Oral DAILY   ??? acetaminophen (TYLENOL) tablet 325 mg  325 mg Oral Q4H PRN   ??? tiZANidine (ZANAFLEX) tablet 4 mg  4 mg Oral TID   ??? oseltamivir (TAMIFLU) capsule 75 mg  75 mg Oral BID   ??? diazePAM (VALIUM) tablet 2 mg  2 mg Oral Q12H PRN   ??? baclofen (LIORESAL) tablet 20 mg  20 mg Oral PCL   ??? albuterol-ipratropium (DUO-NEB) 2.5 MG-0.5 MG/3 ML  3 mL Nebulization Q6H PRN       Pamelia Hoit, MD  P# [phone]  August 25, 2015

## 2015-08-25 NOTE — Other (Signed)
Bedside and Verbal shift change report given to [employer] MIles RN (Cabin crew) by [employer] (offgoing nurse). Report included the following information SBAR, Kardex, ED Summary, Intake/Output, MAR and Recent Results.

## 2015-08-25 NOTE — Progress Notes (Signed)
Patient admitted on 08/23/2015 from home with   Chief Complaint   Patient presents with   ??? Hypotension    The patient is being treated for sepsis, pneumonia, Flu, and dehydration. The transitional care clinic referral has been made for sepsis and pneumonia follow up.     The patient has been admitted to the hospital 1 time in the past 12 months.    Tentative dc plan: Home with family assistance, physician follow up, and resume Home Health with [facility]. The face to face for home health has been sent to Dr. Melvenia Needles for signature. The HH order has been received fro RN, OT, PT, SLP, [facility] aide, and MSW, and the referral has been sent to [facility] via e-dc.    Anticipated Discharge Date: 2-3 days pending progress    PCP: Tomma Lightning, MD     Pharmacy: [facility] on [address]    DME: has a walker, shower-chair built in at home    Home Environment: Lives at [address]  [address]  [place] [address] [phone]. Lives with his sister in a single story home. Requires assistance with ADLs     Prior to admission open services: Yes    Home Health Agency- [facility]  Personal Care Agency-    Extended Emergency Contact Information  Primary Emergency Contact: Mars Scheaffer [facility]  Home Phone: [phone]  Relation: Other Relative     Transportation: family will transport home    Therapy Recommendations:    OT = pending    PT = pending    SLP = NA     RT Home O2 Evaluation = NA     Wound Care = NA     [employer] [facility] Consulted: No, the patient is already a medicaid recipient    Case Management Assessment        Preferred Language for Healthcare Related Communication     Preferred Language for Healthcare Related Communication: English   Spiritual/Ethnic/Cultural/Religious Needs that Should be Incorporated Into Your Care          FUNCTIONAL ASSESSMENT                   Decline in Gait/Transfer/Balance: Yes (comment)        Decline in Capacity to Feed/Dress/Bathe: Yes (comment)   Developmental Delay: No   Chewing/Swallowing Problems: Yes (comment)      DYSPHAGIA SCREENING                          Difficulty with Secretions     Difficulty with Secretions: No      Speech Slurred/Thick/Garbled     Speech Slurred/Thick/Garbled: No      ABUSE/NEGLECT SCREENING   Physical Abuse/Neglect: Denies   Sexual Abuse: Denies   Sexual Abuse: Denies   Other Abuse/Issues: Denies          PRIMARY DECISION MAKER   Primary Decision Maker Name: Damaria Stofko     Primary Decision Maker Phone Number: [phone]                              ADVANCE CARE PLANNING (ACP) DOCUMENTS   Confirm Advance Directive: Yes, not on file       Does the patient have other document types: Power of Attorney      Suicide/Psychosocial Screening   Primary Diagnosis or Primary  Complaint of an Emotional Behavior Disorder: No   Patient is Currently Experiencing Depression: No   Suicidal Ideation/Attempts: No   Homicidal Ideation/Attempts: No   Alcohol/Drug Intoxication: No   Hallucinations/Delusions: No   Pending, Active, or Temporary Detention Orders: No   Aggressive/Inappropriate Behavior: No      SAD PERSONS                                                  READMIT RISK TOOL   Support Systems: Family member(s), Other (comments) (care taker)   Relationship with Primary Physician Group: Seen at least one time within the past 6 months   History of Falls Within Past 3 Months: Yes   Needs Assistance with Wound Care AND/OR Mgnt of O2, Nebulizer: Yes   Requires Financial, Physical and/or Educational Assistance With Medications: No   History of Mental Illness: No   Living Alone: No      CARE MANAGEMENT INTERVENTIONS       PCP Verified by CM: Yes       Palliative Care Consult (Criteria: CHF and RRAT>21): No   Mode of Transport at Discharge: Self       Transition of Care Consult (CM Consult): Discharge Planning, Home Health        Reason Outside Comfort Care Chosen: Patient already serviced by other home care/hospice agency       Discharge Durable Medical Equipment: No   Physical Therapy Consult: Yes   Occupational Therapy Consult: Yes   Speech Therapy Consult: No   Current Support Network: Lives with Caregiver   Reason for Referral: DCP Rounds   History Provided By: Patient, Medical Record   Patient Orientation: Alert and Oriented, Person, Place, Situation, Self   Cognition: Alert   Support System Response: Unavailable   Previous Living Arrangement: Lives with Family Dependent       Prior Functional Level: Assistance with the following:, Shopping, Housework, Cooking, Mobility, Dressing, Bathing, Toileting   Current Functional Level: Bathing, Shopping, Toileting, Housework, Mobility, Cooking, Dressing, Assistance with the following:   Primary Language: English   Can patient return to prior living arrangement: Yes   Ability to make needs known:: Good   Family able to assist with home care needs:: Yes               Types of Needs Identified: Disease Management Education, Treatment Education, ADLs/IADLs, Activity/Exercise, Functions Dependency Needs, Nutrition/Diet   Anticipated Discharge Needs: Home Health Services, TCC   Confirm Follow Up Transport: Family       Plan discussed with Pt/Family/Caregiver: Yes   Freedom of Choice Offered: Yes      DISCHARGE LOCATION   Discharge Placement: Home with home health

## 2015-08-25 NOTE — Other (Signed)
Bedside and Verbal shift change report given to Michaela (oncoming nurse) by Jessica (offgoing nurse). Report included the following information SBAR, Kardex, Intake/Output, MAR and Recent Results.

## 2015-08-25 NOTE — Other (Cosign Needed)
[facility]  Face to Face Encounter    Patient???s Name: [name] Murillo           Date of Birth: January 06, 1956    Primary Diagnosis: Pneumonia  Hypotension  Dehydration  Sepsis (HCC)                    Admit Date: 08/23/2015    Date of Face to Face:  August 25, 2015                    Medical Record Number: [id number]     Attending: Pamelia Hoit, MD      Physician Attestation:  To be filled out by physician who conducted Face-to Face encounter.    Current Problem List:  The encounter with the patient was in whole, or in part, for the following medical condition, which is the primary reason home health care (list medical condition):    Patient Active Hospital Problem List:   Dehydration (08/23/2015)      Pneumonia (08/23/2015)      Hypotension (08/23/2015)       Sepsis (HCC) (08/23/2015)       Influenza (08/23/2015)                              Face to Face Encounter findings are related to primary reason for home care:   yes.     1. I certify that the patient needs intermittent care as follows: skilled nursing care:  teaching/training of disease process, medications, and diet, skilled observation/assessment, patient education, therapeutic drug monitoring and rehabilitative nursing  physical therapy: strengthening, stretching/ROM, transfer training, gait/stair training, balance training and pt/caregiver education  occupational therapy:  ADL safety (ie. cooking, bathing, dressing), ROM and pt/caregiver education  speech therapy:  oral motor development, vital stimulation, articulation and pt/caregiver education    2. I certify that this patient is homebound, that is: 1) patient requires the use of a walker device, special transportation, or assistance of another to leave the home; or 2) patient's condition makes leaving the home medically contraindicated; and 3) patient has a normal inability to leave the home and leaving the home requires considerable and taxing  effort.  Patient may leave the home for infrequent and short duration for medical reasons, and occasional absences for non-medical reasons. Homebound status is due to the following functional limitations: Patient with strength deficits limiting the performance of all ADL's without caregiver assistance or the use of an assistive device.    3. I certify that this patient is under my care and that I, or a nurse practitioner or physician???s assistant, or clinical nurse specialist, or certified nurse midwife, working with me, had a Face-to-Face Encounter that meets the physician Face-to-Face Encounter requirements.  The following are the clinical findings from the Face-to-Face encounter that support the need for skilled services and is a summary of the encounter:     See discharge summary and summary of the patient's illness    Electronically signed:  Owens Shark, RN  08/25/2015      THE FOLLOWING TO BE COMPLETED BY THE  PHYSICIAN:    I concur with the findings described above from the F2F encounter that this patient is homebound and in need of a skilled service.    Electronically signed: Clide Dales. Melvenia Needles, MD  Certifying Physician:

## 2015-08-26 LAB — VANCOMYCIN, TROUGH: Vancomycin,trough: 23.8 ug/mL — CR (ref 15.0–20.0)

## 2015-08-26 MED ORDER — PHARMACY VANCOMYCIN NOTE
Freq: Once | Status: DC
Start: 2015-08-26 — End: 2015-08-26

## 2015-08-26 MED ORDER — AZITHROMYCIN 500 MG TAB
500 mg | ORAL_TABLET | Freq: Every day | ORAL | 0 refills | Status: AC
Start: 2015-08-26 — End: 2015-08-31

## 2015-08-26 MED ORDER — VANCOMYCIN IN 0.9 % SODIUM CHLORIDE 2 GRAM/500 ML IV
2 gram/500 mL | Freq: Two times a day (BID) | INTRAVENOUS | Status: DC
Start: 2015-08-26 — End: 2015-08-26

## 2015-08-26 MED FILL — VANCOMYCIN IN 0.9 % SODIUM CHLORIDE 2 GRAM/500 ML IV: 2 gram/500 mL | INTRAVENOUS | Qty: 500

## 2015-08-26 MED FILL — OSELTAMIVIR PHOSPHATE 75 MG CAP: 75 mg | ORAL | Qty: 1

## 2015-08-26 MED FILL — BACLOFEN 10 MG TAB: 10 mg | ORAL | Qty: 4

## 2015-08-26 MED FILL — ESOMEPRAZOLE MAGNESIUM 20 MG CAP, DELAYED RELEASE: 20 mg | ORAL | Qty: 2

## 2015-08-26 MED FILL — SODIUM CHLORIDE 0.9 % IV PIGGY BACK: INTRAVENOUS | Qty: 100

## 2015-08-26 MED FILL — ASPIRIN 81 MG TAB, DELAYED RELEASE: 81 mg | ORAL | Qty: 1

## 2015-08-26 MED FILL — METOPROLOL TARTRATE 25 MG TAB: 25 mg | ORAL | Qty: 1

## 2015-08-26 MED FILL — PHARMACY VANCOMYCIN NOTE: Qty: 1

## 2015-08-26 MED FILL — PIPERACILLIN-TAZOBACTAM 3.375 GRAM IV SOLR: 3.375 gram | INTRAVENOUS | Qty: 3.38

## 2015-08-26 MED FILL — DOCUSATE SODIUM 100 MG CAP: 100 mg | ORAL | Qty: 1

## 2015-08-26 MED FILL — VITAMIN D3 25 MCG (1,000 UNIT) TABLET: 25 mcg (1,000 unit) | ORAL | Qty: 1

## 2015-08-26 MED FILL — TIZANIDINE 4 MG TAB: 4 mg | ORAL | Qty: 1

## 2015-08-26 MED FILL — DULOXETINE 30 MG CAP, DELAYED RELEASE: 30 mg | ORAL | Qty: 1

## 2015-08-26 MED FILL — AZITHROMYCIN 500 MG IV SOLUTION: 500 mg | INTRAVENOUS | Qty: 5

## 2015-08-26 MED FILL — MULTIVITAMIN ORAL LIQUID: ORAL | Qty: 5

## 2015-08-26 NOTE — Nurse Consult (Signed)
NN- transitional care clinic-59y/o He was here 2 days ago for what was presumed to be an influenza-like illness, had negative CXR that point in time. Was started on Tamiflu. Went home and was doing "okay, over the last 24 hours has felt generally unwell, has a cough.   Patient admitted 1/27 for PNE. Pleasant gentleman states feels betterl, no SOB, CP or cough. Advised patient to complete all atbx after discharge and to call TCC if fever, cough, SOB or CP.     Patient agreed to TCC appt 09-03-15 at 2pm- patient has transportation to appt. TCC info given to patient.Patient being discharged home with [facility]- M.Burnadette Peter, RN BSN

## 2015-08-26 NOTE — Other (Signed)
Bedside and Verbal shift change report given to Tatrona Jones-Hines, RN   (oncoming nurse) by Michaela, RN (offgoing nurse). Report included the following information SBAR and Kardex.
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
## 2015-08-26 NOTE — Progress Notes (Signed)
[facility] Dosing Services: Vancomycin    Consult for Vancomycin Dosing by Pharmacy by Dr. Judi Saa  Indication:  pneumonia, sepsis  Day of Therapy 3      Previous Regimen 1500 mg q8h   Last Level 23.8 this am   Other Current Antibiotics Azithromycin, Zosyn   Significant Cultures    Serum Creatinine Lab Results   Component Value Date/Time    Creatinine 0.9 08/23/2015 11:09 AM       Creatinine Clearance Estimated Creatinine Clearance: 109.8 mL/min (based on Cr of 0.9).   BUN Lab Results   Component Value Date/Time    BUN 13 08/23/2015 11:09 AM       WBC Lab Results   Component Value Date/Time    WBC 7.2 08/23/2015 11:09 AM       H/H Lab Results   Component Value Date/Time    HGB 12.5 08/23/2015 11:09 AM      Platelets Lab Results   Component Value Date/Time    PLATELET 127 08/23/2015 11:09 AM      Temp Temp: 98.7 ??F (37.1 ??C)       Dose administration notes:   Doses given appropriately as scheduled  Vanco trough elevated at 23.8 on dose of 1500 mg q8h  Half-life = 6.7 hours  Will hold Vanco this am  Restart Vanco this afternoon at dose of 2000 mg q12h  Next Vanco trough ordered for 1/31, goal is 15-20   Plan:  Continue to monitor     [employer]

## 2015-08-26 NOTE — Progress Notes (Signed)
Pt refused Pneumonia Vaccine. Pt states, "will follow up with my PCP". Pt discharged at this time with spouse.

## 2015-08-26 NOTE — Progress Notes (Signed)
Discharge Plan:   Home with HH.     Discharge Date:     08/26/2015     SNF/ACR:   na         The facility confirmed that they are ready to receive the patient:     Yes/No;  Spoke           with     na    Home Health Services Needed:     PT, OT, ST, RN, HHA, SW    Home Health Agency:    [facility]  Personal Care Agency:    na          Confirmed start of care with the home health agency/personal care and spoke with:         Yes, s/w Gina  Notified of dc. Info in EDC--no new orders from yesterday.     DME needed and ordered for Discharge:    none    DME Company:    na          CM confirmed delivery of DME: with      na    TCC Referral:       [facility]  On 09/03/2015  appt at 2pm, please arrive at 1:45 please bring medications and ID  [address] [place] [address]  [phone]                  Medication Assistance given:    na             Meds given (please list)     na    Change(MDC/SSDI) Referral/outcome:    na     Transportation/transport time:     Family to transport          Family/patient, nurse and facility(if applicable) aware of pickup time:    Na

## 2015-08-27 MED FILL — SODIUM CHLORIDE 0.9 % IV: INTRAVENOUS | Qty: 1000

## 2015-08-29 LAB — CULTURE, BLOOD
Blood Culture Result: NO GROWTH
Blood Culture Result: NO GROWTH

## 2016-05-02 ENCOUNTER — Inpatient Hospital Stay
Admit: 2016-05-02 | Discharge: 2016-05-06 | Disposition: A | Discharge status: Skilled Nursing Facility | Payer: MEDICARE | Attending: Internal Medicine | Admitting: Internal Medicine

## 2016-05-02 ENCOUNTER — Inpatient Hospital Stay: Payer: MEDICARE | Primary: Family Medicine

## 2016-05-02 DIAGNOSIS — G40909 Epilepsy, unspecified, not intractable, without status epilepticus: Secondary | ICD-10-CM

## 2016-05-02 LAB — METABOLIC PANEL, BASIC
BUN: 12 mg/dl (ref 7–25)
CO2: 33 mEq/L — ABNORMAL HIGH (ref 21–32)
Calcium: 9.1 mg/dl (ref 8.5–10.1)
Chloride: 102 mEq/L (ref 98–107)
Creatinine: 0.7 mg/dl (ref 0.6–1.3)
GFR est AA: >60
GFR est non-AA: >60
Glucose: 91 mg/dl (ref 74–106)
Potassium: 3.8 mEq/L (ref 3.5–5.1)
Sodium: 141 mEq/L (ref 136–145)

## 2016-05-02 LAB — CBC WITH AUTOMATED DIFF
BASOPHILS: 0.3 % (ref 0–3)
EOSINOPHILS: 1.2 % (ref 0–5)
HCT: 39.6 % (ref 37.0–50.0)
HGB: 13.7 gm/dl (ref 12.4–17.2)
IMMATURE GRANULOCYTES: 0.3 % (ref 0.0–3.0)
LYMPHOCYTES: 19.9 % — ABNORMAL LOW (ref 28–48)
MCH: 31.3 pg (ref 23.0–34.6)
MCHC: 34.6 gm/dl (ref 30.0–36.0)
MCV: 90.4 fL (ref 80.0–98.0)
MONOCYTES: 10.8 % (ref 1–13)
MPV: 9.5 fL (ref 6.0–10.0)
NEUTROPHILS: 67.5 % — ABNORMAL HIGH (ref 34–64)
NRBC: 0 (ref 0–0)
PLATELET: 230 10*3/uL (ref 140–450)
RBC: 4.38 M/uL (ref 3.80–5.70)
RDW-SD: 45.7 — ABNORMAL HIGH (ref 35.1–43.9)
WBC: 7 10*3/uL (ref 4.0–11.0)

## 2016-05-02 MED ORDER — SODIUM CHLORIDE 0.9 % IJ SYRG
Freq: Once | INTRAMUSCULAR | Status: AC
Start: 2016-05-02 — End: 2016-05-02
  Administered 2016-05-02: 22:00:00 via INTRAVENOUS

## 2016-05-02 MED ORDER — LEVETIRACETAM IN SOD CHLORIDE (ISO-OSM) 500 MG/100 ML IV PIGGY BACK
500 mg/100 mL | Freq: Two times a day (BID) | INTRAVENOUS | Status: DC
Start: 2016-05-02 — End: 2016-05-03
  Administered 2016-05-03 (×2): via INTRAVENOUS

## 2016-05-02 MED FILL — LEVETIRACETAM IN SOD CHLORIDE (ISO-OSM) 500 MG/100 ML IV PIGGY BACK: 500 mg/100 mL | INTRAVENOUS | Qty: 100
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
## 2016-05-02 NOTE — Other (Signed)
Bedside and Verbal shift change report given to [facility], [facility] (oncoming nurse) by Huntley EstelleBrittney D Taylor, RN   (offgoing nurse). Report included the following information SBAR, Kardex, Intake/Output, MAR, Recent Results and Cardiac Rhythm NSR.

## 2016-05-02 NOTE — ED Notes (Signed)
Pt. And family updated on plan of care.

## 2016-05-02 NOTE — Other (Signed)
Bedside and Verbal shift change report given to Hope L Johnson   (oncoming nurse) by Brittney, RN (offgoing nurse). Report included the following information SBAR, Procedure Summary, Intake/Output, MAR and Recent Results.

## 2016-05-02 NOTE — ED Notes (Signed)
TRANSFER - OUT REPORT:    Verbal report given to Ted McalpineEmily Rn (name) on [name] HeapDonald Murillo  being transferred to CDU (unit) for routine progression of care       Report consisted of patient???s Situation, Background, Assessment and   Recommendations(SBAR).     Information from the following report(s) ED Summary was reviewed with the receiving nurse.    Lines:   Peripheral IV 05/02/16 Right Antecubital (Active)   Site Assessment Clean, dry, & intact 05/02/2016  3:45 PM   Phlebitis Assessment 0 05/02/2016  3:45 PM   Infiltration Assessment 0 05/02/2016  3:45 PM   Dressing Status Clean, dry, & intact 05/02/2016  3:45 PM   Dressing Type Transparent 05/02/2016  3:45 PM   Hub Color/Line Status Flushed;Patent 05/02/2016  3:45 PM        Opportunity for questions and clarification was provided.      Patient transported with:   [employer]
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
## 2016-05-02 NOTE — Other (Signed)
Bedside and Verbal shift change report given to Huntley EstelleBrittney D Taylor, RN   (oncoming nurse) by Irving BurtonEmily, RN (offgoing nurse). Report included the following information SBAR, Kardex, Intake/Output, MAR, Recent Results and Cardiac Rhythm NSR.

## 2016-05-02 NOTE — Progress Notes (Signed)
Problem: Falls - Risk of  Goal: *Absence of Falls  Document Schmid Fall Risk and appropriate interventions in the flowsheet.   Outcome: Progressing Towards Goal  Fall Risk Interventions:                    Fall precautions maintained. Pt free from any falls this shift. Will continue to monitor. Call bell within reach.

## 2016-05-03 MED ORDER — DICLOFENAC 1 % TOPICAL GEL
1 % | Freq: Four times a day (QID) | CUTANEOUS | Status: DC | PRN
Start: 2016-05-03 — End: 2016-05-06

## 2016-05-03 MED ORDER — METOPROLOL TARTRATE 25 MG TAB
25 mg | Freq: Every day | ORAL | Status: DC
Start: 2016-05-03 — End: 2016-05-06
  Administered 2016-05-03 – 2016-05-06 (×4): via ORAL

## 2016-05-03 MED ORDER — POLYETHYLENE GLYCOL 3350 17 GRAM (100 %) ORAL POWDER PACKET
17 gram | Freq: Every day | ORAL | Status: DC
Start: 2016-05-03 — End: 2016-05-06
  Administered 2016-05-03 – 2016-05-06 (×4): via ORAL

## 2016-05-03 MED ORDER — LEVETIRACETAM 500 MG TAB
500 mg | Freq: Two times a day (BID) | ORAL | Status: DC
Start: 2016-05-03 — End: 2016-05-03

## 2016-05-03 MED ORDER — POTASSIUM CHLORIDE SR 10 MEQ TAB
10 mEq | Freq: Two times a day (BID) | ORAL | Status: DC
Start: 2016-05-03 — End: 2016-05-06
  Administered 2016-05-03 – 2016-05-06 (×7): via ORAL

## 2016-05-03 MED ORDER — PANTOPRAZOLE 40 MG TAB, DELAYED RELEASE
40 mg | Freq: Every day | ORAL | Status: DC
Start: 2016-05-03 — End: 2016-05-06
  Administered 2016-05-03 – 2016-05-06 (×5): via ORAL

## 2016-05-03 MED ORDER — BACLOFEN 10 MG TAB
10 mg | Freq: Three times a day (TID) | ORAL | Status: DC
Start: 2016-05-03 — End: 2016-05-06
  Administered 2016-05-03 – 2016-05-06 (×11): via ORAL

## 2016-05-03 MED ORDER — TRIAMCINOLONE ACETONIDE 0.1 % LOTION
0.1 % | Freq: Two times a day (BID) | CUTANEOUS | Status: DC
Start: 2016-05-03 — End: 2016-05-06
  Administered 2016-05-04 – 2016-05-06 (×4): via TOPICAL

## 2016-05-03 MED ORDER — NALOXONE 0.4 MG/ML INJECTION
0.4 mg/mL | INTRAMUSCULAR | Status: DC | PRN
Start: 2016-05-03 — End: 2016-05-06

## 2016-05-03 MED ORDER — DULOXETINE 30 MG CAP, DELAYED RELEASE
30 mg | Freq: Every day | ORAL | Status: DC
Start: 2016-05-03 — End: 2016-05-06
  Administered 2016-05-03 – 2016-05-06 (×4): via ORAL

## 2016-05-03 MED ORDER — .PHARMACY TO SUBSTITUTE PER PROTOCOL
Status: DC | PRN
Start: 2016-05-03 — End: 2016-05-02

## 2016-05-03 MED ORDER — TIZANIDINE 2 MG TAB
2 mg | Freq: Three times a day (TID) | ORAL | Status: DC
Start: 2016-05-03 — End: 2016-05-06
  Administered 2016-05-03 – 2016-05-06 (×12): via ORAL

## 2016-05-03 MED ORDER — ACETAMINOPHEN 325 MG TABLET
325 mg | ORAL | Status: DC | PRN
Start: 2016-05-03 — End: 2016-05-06

## 2016-05-03 MED ORDER — DOCUSATE SODIUM 100 MG CAP
100 mg | Freq: Every day | ORAL | Status: DC
Start: 2016-05-03 — End: 2016-05-06
  Administered 2016-05-03 – 2016-05-06 (×4): via ORAL

## 2016-05-03 MED ORDER — ENOXAPARIN 40 MG/0.4 ML SUB-Q SYRINGE
40 mg/0.4 mL | SUBCUTANEOUS | Status: DC
Start: 2016-05-03 — End: 2016-05-06
  Administered 2016-05-03 – 2016-05-06 (×4): via SUBCUTANEOUS

## 2016-05-03 MED ORDER — SODIUM CHLORIDE 0.9 % IJ SYRG
Freq: Three times a day (TID) | INTRAMUSCULAR | Status: DC
Start: 2016-05-03 — End: 2016-05-06
  Administered 2016-05-03 – 2016-05-06 (×11): via INTRAVENOUS

## 2016-05-03 MED ORDER — MULTIVITAMIN,TX-MINERALS TAB
Freq: Every day | ORAL | Status: DC
Start: 2016-05-03 — End: 2016-05-06
  Administered 2016-05-03 – 2016-05-06 (×4): via ORAL

## 2016-05-03 MED ORDER — VALPROIC ACID 250 MG CAP
250 mg | Freq: Two times a day (BID) | ORAL | Status: DC
Start: 2016-05-03 — End: 2016-05-04
  Administered 2016-05-03 – 2016-05-04 (×3): via ORAL

## 2016-05-03 MED ORDER — AMMONIUM LACTATE 12 % LOTION
12 % | Freq: Every day | CUTANEOUS | Status: DC
Start: 2016-05-03 — End: 2016-05-06
  Administered 2016-05-03 – 2016-05-06 (×4): via TOPICAL

## 2016-05-03 MED ORDER — ASPIRIN 81 MG CHEWABLE TAB
81 mg | Freq: Every day | ORAL | Status: DC
Start: 2016-05-03 — End: 2016-05-06
  Administered 2016-05-03 – 2016-05-06 (×4): via ORAL

## 2016-05-03 MED ORDER — SODIUM CHLORIDE 0.9 % IJ SYRG
INTRAMUSCULAR | Status: DC | PRN
Start: 2016-05-03 — End: 2016-05-06

## 2016-05-03 MED FILL — TIZANIDINE 2 MG TAB: 2 mg | ORAL | Qty: 3

## 2016-05-03 MED FILL — METOPROLOL TARTRATE 25 MG TAB: 25 mg | ORAL | Qty: 1

## 2016-05-03 MED FILL — AMMONIUM LACTATE 12 % LOTION: 12 % | CUTANEOUS | Qty: 226

## 2016-05-03 MED FILL — DULOXETINE 30 MG CAP, DELAYED RELEASE: 30 mg | ORAL | Qty: 2

## 2016-05-03 MED FILL — LEVETIRACETAM IN SOD CHLORIDE (ISO-OSM) 500 MG/100 ML IV PIGGY BACK: 500 mg/100 mL | INTRAVENOUS | Qty: 100

## 2016-05-03 MED FILL — TRIAMCINOLONE ACETONIDE 0.1 % LOTION: 0.1 % | CUTANEOUS | Qty: 60

## 2016-05-03 MED FILL — LOVENOX 40 MG/0.4 ML SUBCUTANEOUS SYRINGE: 40 mg/0.4 mL | SUBCUTANEOUS | Qty: 0.4

## 2016-05-03 MED FILL — POLYETHYLENE GLYCOL 3350 17 GRAM (100 %) ORAL POWDER PACKET: 17 gram | ORAL | Qty: 1

## 2016-05-03 MED FILL — VITAMINS AND MINERALS TABLET: ORAL | Qty: 1

## 2016-05-03 MED FILL — BACLOFEN 10 MG TAB: 10 mg | ORAL | Qty: 4

## 2016-05-03 MED FILL — POTASSIUM CHLORIDE SR 10 MEQ TAB: 10 mEq | ORAL | Qty: 2

## 2016-05-03 MED FILL — BD POSIFLUSH NORMAL SALINE 0.9 % INJECTION SYRINGE: INTRAMUSCULAR | Qty: 10

## 2016-05-03 MED FILL — DOCUSATE SODIUM 100 MG CAP: 100 mg | ORAL | Qty: 1

## 2016-05-03 MED FILL — VALPROIC ACID 250 MG CAP: 250 mg | ORAL | Qty: 2

## 2016-05-03 MED FILL — ASPIRIN 81 MG CHEWABLE TAB: 81 mg | ORAL | Qty: 1

## 2016-05-03 MED FILL — PANTOPRAZOLE 40 MG TAB, DELAYED RELEASE: 40 mg | ORAL | Qty: 1
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
## 2016-05-03 NOTE — Progress Notes (Signed)
Problem: Falls - Risk of  Goal: *Absence of Falls  Document Schmid Fall Risk and appropriate interventions in the flowsheet.   Outcome: Progressing Towards Goal  Fall Risk Interventions:  Mobility Interventions: OT consult for ADLs, Patient to call before getting OOB, PT Consult for mobility concerns, PT Consult for assist device competence, Utilize walker, cane, or other assitive device           Medication Interventions: Bed/chair exit alarm, Evaluate medications/consider consulting pharmacy, Patient to call before getting OOB, Teach patient to arise slowly
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
## 2016-05-04 MED ORDER — LEVETIRACETAM 500 MG TAB
500 mg | Freq: Two times a day (BID) | ORAL | Status: DC
Start: 2016-05-04 — End: 2016-05-06
  Administered 2016-05-04 – 2016-05-06 (×5): via ORAL

## 2016-05-04 MED FILL — PANTOPRAZOLE 40 MG TAB, DELAYED RELEASE: 40 mg | ORAL | Qty: 1

## 2016-05-04 MED FILL — TIZANIDINE 2 MG TAB: 2 mg | ORAL | Qty: 3

## 2016-05-04 MED FILL — VALPROIC ACID 250 MG CAP: 250 mg | ORAL | Qty: 2

## 2016-05-04 MED FILL — ASPIRIN 81 MG CHEWABLE TAB: 81 mg | ORAL | Qty: 1

## 2016-05-04 MED FILL — DULOXETINE 30 MG CAP, DELAYED RELEASE: 30 mg | ORAL | Qty: 2

## 2016-05-04 MED FILL — BACLOFEN 10 MG TAB: 10 mg | ORAL | Qty: 4

## 2016-05-04 MED FILL — VITAMINS AND MINERALS TABLET: ORAL | Qty: 1

## 2016-05-04 MED FILL — BD POSIFLUSH NORMAL SALINE 0.9 % INJECTION SYRINGE: INTRAMUSCULAR | Qty: 50

## 2016-05-04 MED FILL — LEVETIRACETAM 500 MG TAB: 500 mg | ORAL | Qty: 1

## 2016-05-04 MED FILL — POTASSIUM CHLORIDE SR 10 MEQ TAB: 10 mEq | ORAL | Qty: 2

## 2016-05-04 MED FILL — LOVENOX 40 MG/0.4 ML SUBCUTANEOUS SYRINGE: 40 mg/0.4 mL | SUBCUTANEOUS | Qty: 0.4

## 2016-05-04 MED FILL — DOCUSATE SODIUM 100 MG CAP: 100 mg | ORAL | Qty: 1

## 2016-05-04 MED FILL — HEALTHYLAX 17 GRAM ORAL POWDER PACKET: 17 gram | ORAL | Qty: 1

## 2016-05-04 MED FILL — BD POSIFLUSH NORMAL SALINE 0.9 % INJECTION SYRINGE: INTRAMUSCULAR | Qty: 10

## 2016-05-04 MED FILL — METOPROLOL TARTRATE 25 MG TAB: 25 mg | ORAL | Qty: 1
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
## 2016-05-04 NOTE — Progress Notes (Signed)
Problem: Falls - Risk of  Goal: *Absence of Falls  Document Schmid Fall Risk and appropriate interventions in the flowsheet.   Outcome: Progressing Towards Goal  Fall Risk Interventions:  Mobility Interventions: Bed/chair exit alarm, Communicate number of staff needed for ambulation/transfer, PT Consult for mobility concerns, OT consult for ADLs, Patient to call before getting OOB, PT Consult for assist device competence           Medication Interventions: Evaluate medications/consider consulting pharmacy, Patient to call before getting OOB     Elimination Interventions: Bed/chair exit alarm, Call light in reach, Patient to call for help with toileting needs

## 2016-05-04 NOTE — Progress Notes (Addendum)
Patient admitted on 05/02/2016 from Home with   Chief Complaint   Patient presents with   ??? Lethargy        The patient has been admitted to the hospital 2 times in the past 12 months.    Tentative dc plan:  Home with home health-resume with [facility] or SNF. Patient lives with sister Martha ClanJudith [facility](POA) and when she is not homecaregiver is available-patient is never home alone. PT/OT evals pending may need SNF.     Facility if plan pending    Anticipated Discharge Date:   10/10-10/11    PCP: Tomma LightningSteven M Hartline, MD seen last week    Specialists:     EVMS and podiatry    Dialysis Unit/ chair time / access:    no    Pharmacy:     [facility]  Any issues getting medications no    DME:    Dan HumphreysWalker, wheelchair    Home Environment:    Lives at [address]  [address]  [place] [id number]    [phone].     Prior to admission open services:     yes    Home Health Agency-     [facility]    Personal Care Agency-    private    Extended Emergency Contact Information  Primary Emergency Contact: Martha ClanJudith [facility]  Home Phone: [phone]  Relation: Other Relative      Transportation:     Caregiver or Sister will transport home    Therapy Recommendations:  OT :y/n     Pending notes  PT :y/n     Pending notes  SLP :y/n    no    RT Home O2 Evaluation :y/n     no    Wound Care: y/n     no    Change consult (formerly [facility]) :    no    Case Management Assessment    ABUSE/NEGLECT SCREENING   Physical Abuse/Neglect: Denies   Sexual Abuse: Denies   Sexual Abuse: Denies   Other Abuse/Issues: Denies          PRIMARY DECISION MAKER   Primary Decision Maker Name: Martha ClanJudith Meleski   Primary Decision Maker Phone Number: [phone]       Primary Decision Maker Relationship to Patient: Sibling                   CARE MANAGEMENT INTERVENTIONS   Readmission Interview Completed: Not Applicable   PCP Verified by CM: Yes           Mode of Transport at Discharge: Self       Transition of Care Consult (CM Consult): Discharge Planning, Home Health, SNF        Reason Outside Comfort Care Chosen: Patient already serviced by other home care/hospice agency           Physical Therapy Consult: Yes   Occupational Therapy Consult: Yes       Current Support Network: Relative's Home   Reason for Referral: DCP Rounds   History Provided By: Patient, Other (see comment) (caregiver)   Patient Orientation: Alert and Oriented   Cognition: Alert   Support System Response: Cooperative   Previous Living Arrangement: Lives with Family Independent       Prior Functional Level: Assistance with the following:, Mobility   Current Functional Level: Assistance with the following:, Mobility   Primary Language: English   Can patient return to prior living arrangement: Yes   Ability to make  needs known:: Fair   Family able to assist with home care needs:: Yes               Types of Needs Identified: Disease Management Education, Treatment Education, Emotional Support   Anticipated Discharge Needs: Home Health Services, Skilled Nursing Facility   Confirm Follow Up Transport: Family       Plan discussed with Pt/Family/Caregiver: Yes   Freedom of Choice Offered: Yes      DISCHARGE LOCATION   Discharge Placement: Skilled nursing facility

## 2016-05-04 NOTE — Progress Notes (Signed)
Neurology Note    History: [name] Murillo presented with the episode of being unresponsive, possible seizure.      Neurological Examination:  MENTAL STATUS:  The patient is alert and awake.  Has  pressured speech.  No significant aphasia.  Slightly slow to respond.  ??  CRANIAL NERVE EXAMINATION:  Both pupils reactive.  Seems to have full conjugate movements.  Could not check field of vision normal nor fundus.  No obvious facial weakness.  Tongue midline.  Hearing to finger rub normal.  Palatal movements symmetrical.  Shoulder shrugs are normal.  ??  MOTOR EXAMINATION:  Left upper extremity almost zero. Bilateral lower extremity about 1 to 2, although limited exam.  Increased tone in the left upper and bilateral lower extremities.  Gait not checked.  ??  SENSORY EXAMINATION:  Sensation to temperature is symmetrical.  ??  DEEP TENDON REFLEXES:  Bilaterally brisker on the left compared to the right.  ??  IMPRESSION :  The patient presents after having an episode where he was unresponsive with eyes rolled up.  He had similar episode a few days ago as well.  He has a history of seizures and traumatic brain injury.  He just had an MRI of the head done which did not show any acute lesions.  It does show postsurgical changes in the right hemisphere.  ??  At this time, this episode is concerning for possible seizure.  Given his previous history of traumatic brain injury and seizures, we will start seizure meds. He is being started on low dose of depakene.  ??  PLAN  - discussed about seizure meds with the patient  - as depakene was just started, will change to keppra for now  - await EEG  - continue supportive care        Medications:    Current Facility-Administered Medications:   ???  valproic acid (DEPAKENE) capsule 500 mg, 500 mg, Oral, ACB&D, Rosemary Holms, MD, 500 mg at 05/04/16 0750  ???  acetaminophen (TYLENOL) tablet 325 mg, 325 mg, Oral, Q4H PRN, Rosemary Holms, MD   ???  ammonium lactate (LAC-HYDRIN) 12 % lotion, , Topical, DAILY, Rosemary Holms, MD  ???  aspirin chewable tablet 81 mg, 81 mg, Oral, DAILY, Rosemary Holms, MD, 81 mg at 05/03/16 1023  ???  baclofen (LIORESAL) tablet 40 mg, 40 mg, Oral, TID, Rosemary Holms, MD, 40 mg at 05/04/16 0750  ???  diclofenac (VOLTAREN) 1 % topical gel 2 g, 2 g, Topical, QID PRN, Rosemary Holms, MD  ???  docusate sodium (COLACE) capsule 100 mg, 100 mg, Oral, DAILY, Rosemary Holms, MD, 100 mg at 05/03/16 1023  ???  DULoxetine (CYMBALTA) capsule 60 mg, 60 mg, Oral, DAILY, Rosemary Holms, MD, 60 mg at 05/03/16 1023  ???  metoprolol tartrate (LOPRESSOR) tablet 12.5 mg, 12.5 mg, Oral, DAILY, Rosemary Holms, MD, 12.5 mg at 05/03/16 1022  ???  therapeutic multivitamin-minerals (THERAGRAN-M) tablet 1 Tab, 1 Tab, Oral, DAILY, Rosemary Holms, MD, 1 Tab at 05/03/16 1022  ???  pantoprazole (PROTONIX) tablet 40 mg, 40 mg, Oral, ACB, Rosemary Holms, MD, 40 mg at 05/04/16 0750  ???  polyethylene glycol (MIRALAX) packet 17 g, 17 g, Oral, DAILY, Rosemary Holms, MD, 17 g at 05/03/16 1023  ???  potassium chloride SR (KLOR-CON 10) tablet 20 mEq, 20 mEq, Oral, BID WITH MEALS, Rosemary Holms, MD, 20 mEq at 05/04/16 0750  ???  tiZANidine (ZANAFLEX) tablet 6 mg, 6 mg, Oral, Q8H, Rosemary Holms, MD, 6 mg at 05/04/16 0600  ???  triamcinolone (KENALOG) 0.1 % lotion, , Topical, BID, Rosemary HolmsJieming Yan, MD, Stopped at 05/02/16 2200  ???  sodium chloride (NS) flush 5-10 mL, 5-10 mL, IntraVENous, Q8H, Rosemary HolmsJieming Yan, MD, Stopped at 05/04/16 0600  ???  sodium chloride (NS) flush 5-10 mL, 5-10 mL, IntraVENous, PRN, Rosemary HolmsJieming Yan, MD  ???  naloxone (NARCAN) injection 0.1 mg, 0.1 mg, IntraVENous, PRN, Rosemary HolmsJieming Yan, MD  ???  enoxaparin (LOVENOX) injection 40 mg, 40 mg, SubCUTAneous, Q24H, Rosemary HolmsJieming Yan, MD, 40 mg at 05/03/16 2130    Vitals  Patient Vitals for the past 24 hrs:   Temp Pulse Resp BP SpO2   05/04/16 0731 97.7 ??F (36.5 ??C) 71 16 129/74 98 %   05/04/16 0417 97.5 ??F (36.4 ??C) 70 16 123/79 97 %   05/03/16 2334 97.1 ??F (36.2 ??C) 73 16 120/72 95 %    05/03/16 2003 98.4 ??F (36.9 ??C) 78 16 124/85 97 %   05/03/16 1506 98.7 ??F (37.1 ??C) 74 16 116/78 97 %   05/03/16 1103 98.3 ??F (36.8 ??C) 84 16 120/69 95 %         Imaging  No results found. Echo Results  (Last 48 hours)    None            Labs  No results found for this visit on 05/02/16 (from the past 12 hour(s)).  No results found for: CHOL, CHOLPOCT, CHOLX, CHLST, CHOLV, HDL, HDLPOC, LDL, LDLCPOC, LDLC, DLDLP, VLDLC, VLDL, TGLX, TRIGL, TRIGP, TGLPOCT, CHHD, CHHDX        Vickey HugerSoham Shantana Christon, MD

## 2016-05-04 NOTE — Procedures (Signed)
[facility]  [facility]ro[facility]  NAME:  Rudi HeapMOORE, Ary   DATE: 05/04/2016  EEG#:   DOB: [birth year]  MR#    [id number]  ROOM:  3CDU  ACCT#  [id number]  SEX: M  REFERRING PHYSICIAN: Rosemary HolmsJIEMING YAN          TECHNIQUE:  Seventeen EEG and 1 channel of EKG were recorded using the international 10/20 system.    CLINICAL HISTORY:  The patient with a history of having seizure-like activity.    MEDICATIONS:  Aspirin, duloxetine, metoprolol, pantoprazole.    BACKGROUND:  The background activity consisted of 8 to 9 Hz rhythmic waveform activity seen.    ABNORMALITIES:  There were occasional spike and sharp waves seen from the right frontotemporal area.    ACTIVATION TECHNIQUE:  1.  Hyperventilation -- no change in background.  2.  Photic stimulation -- no photic drive seen.  3.  Sleep -- stage I sleep seen.    CONCLUSION:  Abnormal EEG.  There were rare sharp waves seen from the right frontotemporal area.  These waveforms are considered epileptiform in nature and are suggestive of an epileptic focus in the right frontotemporal area.      ___________________  Reece PackerSoham G Juanitta Earnhardt M.D.  Dictated By: .   SB  D:05/04/2016 16:17:13  T: 05/05/2016 05:53:33  [id number]

## 2016-05-05 MED FILL — BD POSIFLUSH NORMAL SALINE 0.9 % INJECTION SYRINGE: INTRAMUSCULAR | Qty: 10

## 2016-05-05 MED FILL — BACLOFEN 10 MG TAB: 10 mg | ORAL | Qty: 4

## 2016-05-05 MED FILL — DULOXETINE 30 MG CAP, DELAYED RELEASE: 30 mg | ORAL | Qty: 2

## 2016-05-05 MED FILL — LOVENOX 40 MG/0.4 ML SUBCUTANEOUS SYRINGE: 40 mg/0.4 mL | SUBCUTANEOUS | Qty: 0.4

## 2016-05-05 MED FILL — PANTOPRAZOLE 40 MG TAB, DELAYED RELEASE: 40 mg | ORAL | Qty: 1

## 2016-05-05 MED FILL — POTASSIUM CHLORIDE SR 10 MEQ TAB: 10 mEq | ORAL | Qty: 2

## 2016-05-05 MED FILL — LEVETIRACETAM 500 MG TAB: 500 mg | ORAL | Qty: 1

## 2016-05-05 MED FILL — VITAMINS AND MINERALS TABLET: ORAL | Qty: 1

## 2016-05-05 MED FILL — TIZANIDINE 2 MG TAB: 2 mg | ORAL | Qty: 3

## 2016-05-05 MED FILL — ASPIRIN 81 MG CHEWABLE TAB: 81 mg | ORAL | Qty: 1

## 2016-05-05 MED FILL — METOPROLOL TARTRATE 25 MG TAB: 25 mg | ORAL | Qty: 1

## 2016-05-05 MED FILL — POLYETHYLENE GLYCOL 3350 17 GRAM (100 %) ORAL POWDER PACKET: 17 gram | ORAL | Qty: 1

## 2016-05-05 MED FILL — DOCUSATE SODIUM 100 MG CAP: 100 mg | ORAL | Qty: 1

## 2016-05-05 NOTE — Progress Notes (Signed)
Problem: Falls - Risk of  Goal: *Absence of Falls  Document Schmid Fall Risk and appropriate interventions in the flowsheet.   Outcome: Progressing Towards Goal  Fall Risk Interventions:  Mobility Interventions: Bed/chair exit alarm, Patient to call before getting OOB           Medication Interventions: Bed/chair exit alarm, Patient to call before getting OOB     Elimination Interventions: Bed/chair exit alarm, Call light in reach, Patient to call for help with toileting needs
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
## 2016-05-05 NOTE — Progress Notes (Signed)
Neurology Note    History: [name] presented with the episode of being unresponsive, possible seizure.      Neurological Examination:  MENTAL STATUS:  The patient is alert and awake.  Has  pressured speech.  No significant aphasia.  Slightly slow to respond.  ??  CRANIAL NERVE EXAMINATION:  Both pupils reactive.  Seems to have full conjugate movements.  Could not check field of vision normal nor fundus.  No obvious facial weakness.  Tongue midline.  Hearing to finger rub normal.  Palatal movements symmetrical.  Shoulder shrugs are normal.  ??  MOTOR EXAMINATION:  Left upper extremity almost zero. Bilateral lower extremity about 1 to 2, although limited exam.  Increased tone in the left upper and bilateral lower extremities.  Gait not checked.  ??  SENSORY EXAMINATION:  Sensation to temperature is symmetrical.  ??  DEEP TENDON REFLEXES:  Bilaterally brisker on the left compared to the right.  ??  IMPRESSION :  The patient presents after having an episode where he was unresponsive with eyes rolled up.  He had similar episode a few days ago as well.  He has a history of seizures and traumatic brain injury.  He just had an MRI of the head done which did not show any acute lesions.  It does show postsurgical changes in the right hemisphere.  ??  At this time, this episode is concerning for possible seizure.  Given his previous history of traumatic brain injury and seizures, we will start seizure meds. He is tolerating keppra well. EEG did show intermittent sharp waves and hence needs antiseiz medications.   ??  PLAN  - discussed about seizure meds with the patient  - continue keppra  - pt does not drive  - can f.up with neurology in 4-6 weeks  - no other acute input from neuro at this time, will sign off, please call if needed        Medications:    Current Facility-Administered Medications:   ???  levETIRAcetam (KEPPRA) tablet 500 mg, 500 mg, Oral, BID, Vickey HugerSoham Ella Guillotte, MD, 500 mg at 05/05/16 0831   ???  acetaminophen (TYLENOL) tablet 325 mg, 325 mg, Oral, Q4H PRN, Rosemary HolmsJieming Yan, MD  ???  ammonium lactate (LAC-HYDRIN) 12 % lotion, , Topical, DAILY, Rosemary HolmsJieming Yan, MD  ???  aspirin chewable tablet 81 mg, 81 mg, Oral, DAILY, Rosemary HolmsJieming Yan, MD, 81 mg at 05/05/16 0831  ???  baclofen (LIORESAL) tablet 40 mg, 40 mg, Oral, TID, Rosemary HolmsJieming Yan, MD, 40 mg at 05/05/16 0830  ???  diclofenac (VOLTAREN) 1 % topical gel 2 g, 2 g, Topical, QID PRN, Rosemary HolmsJieming Yan, MD  ???  docusate sodium (COLACE) capsule 100 mg, 100 mg, Oral, DAILY, Rosemary HolmsJieming Yan, MD, 100 mg at 05/05/16 0831  ???  DULoxetine (CYMBALTA) capsule 60 mg, 60 mg, Oral, DAILY, Rosemary HolmsJieming Yan, MD, 60 mg at 05/05/16 0831  ???  metoprolol tartrate (LOPRESSOR) tablet 12.5 mg, 12.5 mg, Oral, DAILY, Rosemary HolmsJieming Yan, MD, 12.5 mg at 05/05/16 [id number]  ???  therapeutic multivitamin-minerals (THERAGRAN-M) tablet 1 Tab, 1 Tab, Oral, DAILY, Rosemary HolmsJieming Yan, MD, 1 Tab at 05/05/16 [phone]  ???  pantoprazole (PROTONIX) tablet 40 mg, 40 mg, Oral, ACB, Rosemary HolmsJieming Yan, MD, 40 mg at 05/05/16 0851  ???  polyethylene glycol (MIRALAX) packet 17 g, 17 g, Oral, DAILY, Rosemary HolmsJieming Yan, MD, 17 g at 05/05/16 [id number]  ???  potassium chloride SR (KLOR-CON 10) tablet 20 mEq, 20 mEq, Oral, BID WITH MEALS, Rosemary HolmsJieming Yan, MD, 20 mEq at  05/05/16 0829  ???  tiZANidine (ZANAFLEX) tablet 6 mg, 6 mg, Oral, Q8H, Rosemary Holms, MD, 6 mg at 05/05/16 0550  ???  triamcinolone (KENALOG) 0.1 % lotion, , Topical, BID, Rosemary Holms, MD, Stopped at 05/02/16 2200  ???  sodium chloride (NS) flush 5-10 mL, 5-10 mL, IntraVENous, Q8H, Rosemary Holms, MD, 10 mL at 05/05/16 0544  ???  sodium chloride (NS) flush 5-10 mL, 5-10 mL, IntraVENous, PRN, Rosemary Holms, MD  ???  naloxone (NARCAN) injection 0.1 mg, 0.1 mg, IntraVENous, PRN, Rosemary Holms, MD  ???  enoxaparin (LOVENOX) injection 40 mg, 40 mg, SubCUTAneous, Q24H, Rosemary Holms, MD, 40 mg at 05/04/16 2203    Vitals  Patient Vitals for the past 24 hrs:   Temp Pulse Resp BP SpO2   05/05/16 1134 98.3 ??F (36.8 ??C) 69 16 120/69 97 %    05/05/16 0753 97.6 ??F (36.4 ??C) 71 16 (!) 131/92 95 %   05/05/16 0430 97.4 ??F (36.3 ??C) 70 20 121/71 100 %   05/05/16 0003 98 ??F (36.7 ??C) 78 19 120/84 100 %   05/04/16 1952 97.6 ??F (36.4 ??C) 68 19 116/68 98 %   05/04/16 1514 98.6 ??F (37 ??C) 73 14 119/68 97 %         Imaging  No results found.   Echo Results  (Last 48 hours)    None            Labs  No results found for this visit on 05/02/16 (from the past 12 hour(s)).  No results found for: CHOL, CHOLPOCT, CHOLX, CHLST, CHOLV, HDL, HDLPOC, LDL, LDLCPOC, LDLC, DLDLP, VLDLC, VLDL, TGLX, TRIGL, TRIGP, TGLPOCT, CHHD, CHHDX        Vickey Huger, MD

## 2016-05-05 NOTE — Progress Notes (Signed)
CM spoke with patient sister [facility] has been there in the past. CM loaded in curaspan.

## 2016-05-05 NOTE — Other (Signed)
Bedside shift change report given to Khristine Pascasio, RN (oncoming nurse) by Charyl BiggerJelwyn Agustin, RN (offgoing nurse). Report included the following information SBAR, Kardex, MAR, Recent Results and Cardiac Rhythm SR w/ PVCs.

## 2016-05-05 NOTE — Procedures (Signed)
[facility]  Electroencephalogram  NAME:  [name], [name] Murillo   DATE: 05/04/2016  EEG#:   DOB: 11/08/1955  MR#    [id number]  ROOM:  3CDU  ACCT#  [id number]  SEX: M  REFERRING PHYSICIAN: JIEMING YAN          TECHNIQUE:  Seventeen EEG and 1 channel of EKG were recorded using the international 10/20 system.    CLINICAL HISTORY:  The patient with a history of having seizure-like activity.    MEDICATIONS:  Aspirin, duloxetine, metoprolol, pantoprazole.    BACKGROUND:  The background activity consisted of 8 to 9 Hz rhythmic waveform activity seen.    ABNORMALITIES:  There were occasional spike and sharp waves seen from the right frontotemporal area.    ACTIVATION TECHNIQUE:  1.  Hyperventilation -- no change in background.  2.  Photic stimulation -- no photic drive seen.  3.  Sleep -- stage I sleep seen.    CONCLUSION:  Abnormal EEG.  There were rare sharp waves seen from the right frontotemporal area.  These waveforms are considered epileptiform in nature and are suggestive of an epileptic focus in the right frontotemporal area.      ___________________  Debarah Mccumbers G Arryn Terrones M.D.  Dictated By: .   SB  D:05/04/2016 16:17:13  T: 05/05/2016 05:53:33  [id number]
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
## 2016-05-06 MED ORDER — LEVETIRACETAM 500 MG TAB
500 mg | ORAL_TABLET | Freq: Two times a day (BID) | ORAL | 0 refills | Status: DC
Start: 2016-05-06 — End: 2016-12-17

## 2016-05-06 MED FILL — POLYETHYLENE GLYCOL 3350 17 GRAM (100 %) ORAL POWDER PACKET: 17 gram | ORAL | Qty: 1

## 2016-05-06 MED FILL — PANTOPRAZOLE 40 MG TAB, DELAYED RELEASE: 40 mg | ORAL | Qty: 1

## 2016-05-06 MED FILL — BD POSIFLUSH NORMAL SALINE 0.9 % INJECTION SYRINGE: INTRAMUSCULAR | Qty: 10

## 2016-05-06 MED FILL — TIZANIDINE 2 MG TAB: 2 mg | ORAL | Qty: 3

## 2016-05-06 MED FILL — LEVETIRACETAM 500 MG TAB: 500 mg | ORAL | Qty: 1

## 2016-05-06 MED FILL — VITAMINS AND MINERALS TABLET: ORAL | Qty: 1

## 2016-05-06 MED FILL — BACLOFEN 10 MG TAB: 10 mg | ORAL | Qty: 4

## 2016-05-06 MED FILL — METOPROLOL TARTRATE 25 MG TAB: 25 mg | ORAL | Qty: 1

## 2016-05-06 MED FILL — POTASSIUM CHLORIDE SR 10 MEQ TAB: 10 mEq | ORAL | Qty: 2

## 2016-05-06 MED FILL — DOCUSATE SODIUM 100 MG CAP: 100 mg | ORAL | Qty: 1

## 2016-05-06 MED FILL — ASPIRIN 81 MG CHEWABLE TAB: 81 mg | ORAL | Qty: 1

## 2016-05-06 MED FILL — DULOXETINE 30 MG CAP, DELAYED RELEASE: 30 mg | ORAL | Qty: 2

## 2016-05-06 MED FILL — LOVENOX 40 MG/0.4 ML SUBCUTANEOUS SYRINGE: 40 mg/0.4 mL | SUBCUTANEOUS | Qty: 0.4

## 2016-05-06 NOTE — Other (Signed)
Bedside shift change report given to Alyce PaganEmily Speakman, RN (oncoming nurse) by Charyl BiggerJelwyn Agustin, RN (offgoing nurse). Report included the following information SBAR, Kardex, MAR, Recent Results and Cardiac Rhythm SR w/ PVCs.

## 2016-05-06 NOTE — Other (Addendum)
----------  DocumentID: [id number]------------------------------------------------              [id number]                       Patient Education Report         Name: [name] Murillo, [name] Murillo                  Date: 05/02/2016    MRN: [id number]                    Time: 5:47:23 PM         Patient ordered video: 'Patient Safety: Stay Safe While you are in the Hospital'    from 3CDU_CD11_1 via phone number: [phone] at 5:47:23 PM    ----------DocumentID: [id number]------------------------------------------------                       [facility]          Patient Education Report - Discharge Summary        Date: 05/06/2016   Time: 3:14:28 PM   Name: [name] Murillo, [name] Murillo   MRN: [id number]      Account Number: [id number]      Education History:        Patient ordered video: 'Patient Safety: Stay Safe While you are in the Hospital' from 3CDU_CD11_1 on 05/02/2016 05:47:23 PM

## 2016-05-06 NOTE — Progress Notes (Signed)
Transport via [facility]  [facility] @ 1500 [facility]  CM notified  Packet sent to floor
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
## 2016-05-06 NOTE — Progress Notes (Signed)
This SW received a referral for a [facility] for placement.  Discussed case with Norm Saltonna Bethea CM this afternoon.  Patient has personal care services through [facility] per [place] CM.  No need to complete a new form.  He will resume services once his discharged from Inpatient Rehab.  Will close this case at this time.

## 2016-05-06 NOTE — Progress Notes (Signed)
Problem: Falls - Risk of  Goal: *Absence of Falls  Document Schmid Fall Risk and appropriate interventions in the flowsheet.   Outcome: Progressing Towards Goal  Fall Risk Interventions:  Mobility Interventions: Assess mobility with egress test, Bed/chair exit alarm, PT Consult for mobility concerns, PT Consult for assist device competence, Utilize walker, cane, or other assitive device, Utilize gait belt for transfers/ambulation           Medication Interventions: Assess postural VS orthostatic hypotension, Bed/chair exit alarm, Patient to call before getting OOB, Teach patient to arise slowly, Utilize gait belt for transfers/ambulation     Elimination Interventions: Bed/chair exit alarm, Call light in reach, Patient to call for help with toileting needs, Toilet paper/wipes in reach, Toileting schedule/hourly rounds

## 2016-05-06 NOTE — Progress Notes (Signed)
Discharge Plan:   [facility]. Nurse can call report [phone].     Discharge Date:     05/06/2016     Assisted Living Facility:    no    FOC given : yes Patient sister (POA) wanted patient to go to [facility]    The facility confirmed that they are ready to receive the patient:     Yes/No; the CM spoke with     Eugene GaviaSuzanne Kellam    Does the patient have  an insurance company assigned cm   Yes/NO    Spoke with case manager  no    Collaborative dc plan  no    Home Health Needed:     no    Home Health Agency:    no    Confirmed start of care with the home health agency and spoke with:      no    DME needed and ordered for Discharge:    no    DME Company:    no    CM confirmed delivery of DME: with      no    TCC Referral:     no    Medication Assistance given     Y/N       meds given (please list)     no    Change(MDC/SSDI) Referral/outcome    no     Transportation: Development worker, communityamily/ Medical    Medical    Transport Time:    Pending for 1500    Family, MD, patient, nurse and facility aware of pickup time    yes
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
## 2016-05-21 DIAGNOSIS — Z87898 Personal history of other specified conditions: Secondary | ICD-10-CM | POA: Insufficient documentation

## 2016-12-18 ENCOUNTER — Inpatient Hospital Stay: Payer: MEDICARE

## 2016-12-18 MED ORDER — LIDOCAINE (PF) 10 MG/ML (1 %) IJ SOLN
10 mg/mL (1 %) | INTRAMUSCULAR | Status: DC | PRN
Start: 2016-12-18 — End: 2016-12-18
  Administered 2016-12-18: 19:00:00 via INTRAVENOUS

## 2016-12-18 MED ORDER — LABETALOL 5 MG/ML IV SOLN
5 mg/mL | INTRAVENOUS | Status: DC | PRN
Start: 2016-12-18 — End: 2016-12-18

## 2016-12-18 MED ORDER — FLUMAZENIL 0.1 MG/ML IV SOLN
0.1 mg/mL | INTRAVENOUS | Status: DC | PRN
Start: 2016-12-18 — End: 2016-12-18

## 2016-12-18 MED ORDER — SODIUM CHLORIDE 0.9 % IV
INTRAVENOUS | Status: DC | PRN
Start: 2016-12-18 — End: 2016-12-18
  Administered 2016-12-18: 18:00:00 via INTRAVENOUS

## 2016-12-18 MED ORDER — SODIUM CHLORIDE 0.9 % INJECTION
10 mg/mL | INTRAMUSCULAR | Status: DC | PRN
Start: 2016-12-18 — End: 2016-12-18
  Administered 2016-12-18 (×2): via INTRAVENOUS

## 2016-12-18 MED ORDER — PROPOFOL 10 MG/ML IV EMUL
10 mg/mL | INTRAVENOUS | Status: AC
Start: 2016-12-18 — End: ?

## 2016-12-18 MED ORDER — PROMETHAZINE IN NS 6.25 MG/50 ML IV PIGGY BAG
6.25 mg/50 ml | INTRAVENOUS | Status: DC | PRN
Start: 2016-12-18 — End: 2016-12-18

## 2016-12-18 MED ORDER — DIPHENHYDRAMINE HCL 50 MG/ML IJ SOLN
50 mg/mL | Freq: Once | INTRAMUSCULAR | Status: DC | PRN
Start: 2016-12-18 — End: 2016-12-18

## 2016-12-18 MED ORDER — MORPHINE 4 MG/ML INTRAVENOUS SOLUTION
4 mg/mL | INTRAVENOUS | Status: DC | PRN
Start: 2016-12-18 — End: 2016-12-18

## 2016-12-18 MED ORDER — EPHEDRINE SULFATE 50 MG/ML INJECTION SOLUTION
50 mg/mL | INTRAMUSCULAR | Status: AC
Start: 2016-12-18 — End: ?

## 2016-12-18 MED ORDER — FENTANYL CITRATE (PF) 50 MCG/ML IJ SOLN
50 mcg/mL | INTRAMUSCULAR | Status: DC | PRN
Start: 2016-12-18 — End: 2016-12-18

## 2016-12-18 MED ORDER — PROPOFOL 10 MG/ML IV EMUL
10 mg/mL | INTRAVENOUS | Status: DC | PRN
Start: 2016-12-18 — End: 2016-12-18
  Administered 2016-12-18 (×2): via INTRAVENOUS

## 2016-12-18 MED ORDER — HYDRALAZINE 20 MG/ML IJ SOLN
20 mg/mL | INTRAMUSCULAR | Status: DC | PRN
Start: 2016-12-18 — End: 2016-12-18

## 2016-12-18 MED ORDER — NALOXONE 0.4 MG/ML INJECTION
0.4 mg/mL | INTRAMUSCULAR | Status: DC | PRN
Start: 2016-12-18 — End: 2016-12-18

## 2016-12-18 MED ORDER — EPHEDRINE SULFATE 50 MG/ML INJECTION SOLUTION
50 mg/mL | INTRAMUSCULAR | Status: DC | PRN
Start: 2016-12-18 — End: 2016-12-18
  Administered 2016-12-18 (×2): via INTRAVENOUS

## 2016-12-18 MED ORDER — PHENYLEPHRINE 10 MG/ML INJECTION
10 mg/mL | INTRAMUSCULAR | Status: AC
Start: 2016-12-18 — End: ?

## 2016-12-18 MED ORDER — HYDROMORPHONE (PF) 1 MG/ML IJ SOLN
1 mg/mL | INTRAMUSCULAR | Status: DC | PRN
Start: 2016-12-18 — End: 2016-12-18

## 2016-12-18 MED FILL — DIPRIVAN 10 MG/ML INTRAVENOUS EMULSION: 10 mg/mL | INTRAVENOUS | Qty: 50

## 2016-12-18 MED FILL — EPHEDRINE SULFATE 50 MG/ML INTRAVENOUS SOLUTION: 50 mg/mL | INTRAVENOUS | Qty: 1

## 2016-12-18 MED FILL — PHENYLEPHRINE 10 MG/ML INJECTION: 10 mg/mL | INTRAMUSCULAR | Qty: 1
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
## 2016-12-18 NOTE — Anesthesia Post-Procedure Evaluation (Signed)
Post-Anesthesia Evaluation and Assessment    Patient: [name] Murillo MRN: [id number]  SSN: [id number]    Date of Birth: 12/14/1955  Age: 61 y.o.  Sex: male       Cardiovascular Function/Vital Signs  Visit Vitals   ??? BP 147/88   ??? Pulse 77   ??? Temp 36.3 ??C (97.4 ??F)   ??? Resp 16   ??? Ht 6' 4"  (1.93 m)   ??? Wt 112.2 kg (247 lb 5.7 oz)   ??? SpO2 96%   ??? BMI 30.11 kg/m2       Patient is status post MAC, total IV anesthesia anesthesia for Procedure(s):  COLONOSCOPY, SCREENING,/c Polypectomy.    Nausea/Vomiting: None    Postoperative hydration reviewed and adequate.    Pain:  Pain Scale 1: Numeric (0 - 10) (12/18/16 1528)  Pain Intensity 1: 0 (12/18/16 1528)   Managed    Neurological Status:   Neuro (WDL): Exceptions to WDL (12/18/16 1457)  Neuro  LUE Motor Response: Weak (12/18/16 1457)  LLE Motor Response: Weak (12/18/16 1457)  RUE Motor Response: Weak (12/18/16 1457)   At baseline    Mental Status and Level of Consciousness: Arousable    Pulmonary Status:   O2 Device: Room air (12/18/16 1500)   Adequate oxygenation and airway patent    Complications related to anesthesia: None    Post-anesthesia assessment completed. No concerns    Signed By: Avel Peace, MD     Dec 18, 2016

## 2016-12-18 NOTE — Anesthesia Pre-Procedure Evaluation (Addendum)
Anesthetic History               Review of Systems / Medical History      Pulmonary                   Neuro/Psych         Neuromuscular disease (raumatic brain injury with L hemiparesis)     Cardiovascular    Hypertension                   GI/Hepatic/Renal     GERD           Endo/Other        Anemia     Other Findings                 Anesthetic Plan    ASA: 3  Anesthesia type: MAC and total IV anesthesia

## 2016-12-19 MED FILL — SODIUM CHLORIDE 0.9 % IV: INTRAVENOUS | Qty: 500

## 2016-12-19 MED FILL — PROPOFOL 10 MG/ML IV EMUL: 10 mg/mL | INTRAVENOUS | Qty: 80

## 2016-12-19 MED FILL — EPHEDRINE SULFATE 50 MG/ML INTRAVENOUS SOLUTION: 50 mg/mL | INTRAVENOUS | Qty: 20

## 2016-12-19 MED FILL — PHENYLEPHRINE 10 MG/ML INJECTION: 10 mg/mL | INTRAMUSCULAR | Qty: 200

## 2016-12-19 MED FILL — XYLOCAINE-MPF 10 MG/ML (1 %) INJECTION SOLUTION: 10 mg/mL (1 %) | INTRAMUSCULAR | Qty: 80

## 2016-12-19 MED FILL — PROPOFOL 10 MG/ML IV EMUL: 10 mg/mL | INTRAVENOUS | Qty: 306

## 2016-12-25 DIAGNOSIS — D369 Benign neoplasm, unspecified site: Secondary | ICD-10-CM | POA: Insufficient documentation

## 2017-01-12 DIAGNOSIS — E042 Nontoxic multinodular goiter: Secondary | ICD-10-CM | POA: Insufficient documentation

## 2017-03-02 ENCOUNTER — Emergency Department: Admit: 2017-03-02 | Payer: MEDICARE | Primary: Family Medicine

## 2017-03-02 ENCOUNTER — Inpatient Hospital Stay
Admit: 2017-03-02 | Discharge: 2017-03-02 | Disposition: A | Discharge status: Home / Self Care | Payer: MEDICARE | Attending: Emergency Medicine

## 2017-03-02 DIAGNOSIS — E162 Hypoglycemia, unspecified: Secondary | ICD-10-CM

## 2017-03-02 LAB — CBC WITH AUTOMATED DIFF
BASOPHILS: 0.3 % (ref 0–3)
EOSINOPHILS: 2.3 % (ref 0–5)
HCT: 39.8 % (ref 37.0–50.0)
HGB: 13.3 gm/dl (ref 12.4–17.2)
IMMATURE GRANULOCYTES: 0.1 % (ref 0.0–3.0)
LYMPHOCYTES: 16.2 % — ABNORMAL LOW (ref 28–48)
MCH: 30.4 pg (ref 23.0–34.6)
MCHC: 33.4 gm/dl (ref 30.0–36.0)
MCV: 90.9 fL (ref 80.0–98.0)
MONOCYTES: 10.9 % (ref 1–13)
MPV: 9.3 fL (ref 6.0–10.0)
NEUTROPHILS: 70.2 % — ABNORMAL HIGH (ref 34–64)
NRBC: 0 (ref 0–0)
PLATELET: 227 10*3/uL (ref 140–450)
RBC: 4.38 M/uL (ref 3.80–5.70)
RDW-SD: 44.6 — ABNORMAL HIGH (ref 35.1–43.9)
WBC: 7.1 10*3/uL (ref 4.0–11.0)

## 2017-03-02 LAB — METABOLIC PANEL, COMPREHENSIVE
ALT (SGPT): 14 U/L (ref 12–78)
AST (SGOT): 21 U/L (ref 15–37)
Albumin: 3.5 gm/dl (ref 3.4–5.0)
Alk. phosphatase: 71 U/L (ref 45–117)
Anion gap: 4 mmol/L — ABNORMAL LOW (ref 5–15)
BUN: 11 mg/dl (ref 7–25)
Bilirubin, total: 1.2 mg/dl — ABNORMAL HIGH (ref 0.2–1.0)
CO2: 32 mEq/L (ref 21–32)
Calcium: 8.4 mg/dl — ABNORMAL LOW (ref 8.5–10.1)
Chloride: 105 mEq/L (ref 98–107)
Creatinine: 0.7 mg/dl (ref 0.6–1.3)
GFR est AA: >60
GFR est non-AA: >60
Glucose: 49 mg/dl — CL (ref 74–106)
Potassium: 3.8 mEq/L (ref 3.5–5.1)
Protein, total: 7.6 gm/dl (ref 6.4–8.2)
Sodium: 140 mEq/L (ref 136–145)

## 2017-03-02 LAB — POC URINE MACROSCOPIC
Bilirubin: NEGATIVE
Blood: NEGATIVE
Glucose: NEGATIVE mg/dl
Ketone: NEGATIVE mg/dl
Leukocyte Esterase: NEGATIVE
Nitrites: NEGATIVE
Protein: NEGATIVE mg/dl
Specific gravity: 1.015 (ref 1.005–1.030)
Urobilinogen: 0.2 EU/dl (ref 0.0–1.0)
pH (UA): 7 (ref 5–9)

## 2017-03-02 LAB — TROPONIN I: Troponin-I: <0.015 ng/ml (ref 0.000–0.045)

## 2017-03-02 LAB — GLUCOSE, POC
Glucose (POC): 97 mg/dL (ref 65–105)
Glucose (POC): 143 mg/dL — ABNORMAL HIGH (ref 65–105)

## 2017-03-02 LAB — POC TROPONIN: Troponin-I: 0 ng/ml (ref 0.00–0.07)

## 2017-03-02 MED ORDER — LANCETS 30 GAUGE AND BLOOD GLUCOSE STRIPS COMBO PACK
30 gauge | Freq: Three times a day (TID) | 0 refills | Status: AC
Start: 2017-03-02 — End: ?

## 2017-03-02 MED ORDER — SODIUM CHLORIDE 0.9 % IJ SYRG
Freq: Once | INTRAMUSCULAR | Status: DC
Start: 2017-03-02 — End: 2017-03-02

## 2017-03-02 MED ORDER — LANCETS
11 refills | Status: AC
Start: 2017-03-02 — End: ?

## 2017-03-02 MED ORDER — BLOOD GLUCOSE METER KIT
PACK | 0 refills | Status: AC
Start: 2017-03-02 — End: ?

## 2017-03-02 MED ORDER — SODIUM CHLORIDE 0.9% BOLUS IV
0.9 % | INTRAVENOUS | Status: AC
Start: 2017-03-02 — End: 2017-03-02
  Administered 2017-03-02: 14:00:00 via INTRAVENOUS
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
## 2017-03-02 NOTE — ED Notes (Signed)
3:15 PM  03/02/17     Discharge instructions given to patients sister (name) with verbalization of understanding. Patient accompanied by caregiver.  Patient discharged with the following prescriptions   Current Discharge Medication List      START taking these medications    Details   Blood-Glucose Meter monitoring kit Use as directed  Qty: 1 Kit, Refills: 0      Lancets (LANCETS, SUPER THIN) misc Urinalysis to check blood sugar  Qty: 1 Each, Refills: 11      lancets-blood glucose strips (FORA V10-V12-D10-D20 STRP-LNCT) 30 gauge cmpk 1 Drop by Does Not Apply route three (3) times daily.  Qty: 1 Dose Pack, Refills: 0          . Patient discharged to Home (destination).      Katherine Roan, RN

## 2017-03-02 NOTE — ED Triage Notes (Addendum)
General weakness intermittent.  "sweating" on and off for a couple days.    Patient baseline is slurred speech with left side paralysis   Patient states he had a productive cough once.
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
## 2017-03-02 NOTE — Progress Notes (Signed)
CM consult received for home health (RN and dietitian).  Met with pt and sister/POA Fredric Mare).  FOC obtained for [place].  Referral called to [facility] [phone]) and emailed to [facility].  F2F completed and routed to Dr Claris Pong.  Also provided pt and sister diabetic supply information ([employer] glucometer) and [facility] referral form to discuss with PCP at follow up.

## 2017-03-16 ENCOUNTER — Inpatient Hospital Stay: Admit: 2017-03-16 | Payer: MEDICARE | Primary: Family Medicine

## 2017-03-16 DIAGNOSIS — R350 Frequency of micturition: Secondary | ICD-10-CM

## 2017-03-16 LAB — URINALYSIS W/ RFLX MICROSCOPIC
Bilirubin: NEGATIVE
Blood: NEGATIVE
Glucose: NEGATIVE mg/dl
Ketone: NEGATIVE mg/dl
Leukocyte Esterase: NEGATIVE
Nitrites: NEGATIVE
Protein: NEGATIVE mg/dl
Specific gravity: 1.01 (ref 1.005–1.030)
Urobilinogen: 0.2 mg/dl (ref 0.0–1.0)
pH (UA): 7.5 (ref 5.0–9.0)

## 2017-03-17 LAB — CULTURE, URINE
CULTURE RESULT: <10000 — AB
Culture result: <10000 — AB

## 2018-12-30 ENCOUNTER — Emergency Department: Admit: 2018-12-30 | Payer: MEDICARE | Primary: Family Medicine

## 2018-12-30 ENCOUNTER — Inpatient Hospital Stay
Admit: 2018-12-30 | Discharge: 2018-12-30 | Disposition: A | Discharge status: Home / Self Care | Payer: MEDICARE | Attending: Emergency Medicine

## 2018-12-30 DIAGNOSIS — R531 Weakness: Secondary | ICD-10-CM

## 2018-12-30 LAB — BASIC METABOLIC PANEL
Anion Gap: 5 mmol/L (ref 5–15)
BUN: 9 mg/dl (ref 7–25)
CO2: 29 mEq/L (ref 21–32)
Calcium: 8.4 mg/dl — ABNORMAL LOW (ref 8.5–10.1)
Chloride: 104 mEq/L (ref 98–107)
Creatinine: 0.7 mg/dl (ref 0.6–1.3)
EGFR IF NonAfrican American: >60
GFR African American: >60
Glucose: 99 mg/dl (ref 74–106)
Potassium: 3.6 mEq/L (ref 3.5–5.1)
Sodium: 138 mEq/L (ref 136–145)

## 2018-12-30 LAB — EKG 12-LEAD
Atrial Rate: 99 {beats}/min
Diagnosis: NORMAL
P Axis: 31 degrees
P-R Interval: 172 ms
Q-T Interval: 356 ms
QRS Duration: 94 ms
QTc Calculation (Bazett): 456 ms
R Axis: -34 degrees
T Axis: 34 degrees
Ventricular Rate: 99 {beats}/min

## 2018-12-30 LAB — POC URINE MACROSCOPIC
Bilirubin, Urine: NEGATIVE
Bilirubin: NEGATIVE
Blood, Urine: NEGATIVE
Blood: NEGATIVE
Glucose, Ur: NEGATIVE mg/dl
Glucose: NEGATIVE mg/dl
Ketone: 15 mg/dl — AB
Ketones, Urine: 15 mg/dl — AB
Leukocyte Esterase, Urine: NEGATIVE
Leukocyte Esterase: NEGATIVE
Nitrite, Urine: NEGATIVE
Nitrites: NEGATIVE
Protein, UA: NEGATIVE mg/dl
Protein: NEGATIVE mg/dl
Specific Gravity, UA: 1.025 (ref 1.005–1.030)
Specific gravity: 1.025 (ref 1.005–1.030)
Urobilinogen, UA, POCT: 0.2 EU/dl (ref 0.0–1.0)
Urobilinogen: 0.2 EU/dl (ref 0.0–1.0)
pH (UA): 7.5 (ref 5–9)
pH, UA: 7.5 (ref 5–9)

## 2018-12-30 LAB — CBC WITH AUTO DIFFERENTIAL
Basophils %: 0.2 % (ref 0–3)
Eosinophils %: 0.3 % (ref 0–5)
Hematocrit: 41.1 % (ref 37.0–50.0)
Hemoglobin: 13.7 gm/dl (ref 12.4–17.2)
Immature Granulocytes: 0.4 % (ref 0.0–3.0)
Lymphocytes %: 5.7 % — ABNORMAL LOW (ref 28–48)
MCH: 30.6 pg (ref 23.0–34.6)
MCHC: 33.3 gm/dl (ref 30.0–36.0)
MCV: 91.9 fL (ref 80.0–98.0)
MPV: 9.6 fL (ref 6.0–10.0)
Monocytes %: 7.9 % (ref 1–13)
Neutrophils %: 85.5 % — ABNORMAL HIGH (ref 34–64)
Nucleated RBCs: 0 (ref 0–0)
Platelets: 209 10*3/uL (ref 140–450)
RBC: 4.47 M/uL (ref 3.80–5.70)
RDW-SD: 47 — ABNORMAL HIGH (ref 35.1–43.9)
WBC: 12.3 10*3/uL — ABNORMAL HIGH (ref 4.0–11.0)

## 2018-12-30 LAB — LACTIC ACID
LACTIC ACID: 1.4 mmol/L (ref 0.4–2.0)
Lactic Acid: 1.4 mmol/L (ref 0.4–2.0)

## 2018-12-30 LAB — CBC WITH AUTOMATED DIFF
BASOPHILS: 0.2 % (ref 0–3)
EOSINOPHILS: 0.3 % (ref 0–5)
HCT: 41.1 % (ref 37.0–50.0)
HGB: 13.7 gm/dl (ref 12.4–17.2)
IMMATURE GRANULOCYTES: 0.4 % (ref 0.0–3.0)
LYMPHOCYTES: 5.7 % — ABNORMAL LOW (ref 28–48)
MCH: 30.6 pg (ref 23.0–34.6)
MCHC: 33.3 gm/dl (ref 30.0–36.0)
MCV: 91.9 fL (ref 80.0–98.0)
MONOCYTES: 7.9 % (ref 1–13)
MPV: 9.6 fL (ref 6.0–10.0)
NEUTROPHILS: 85.5 % — ABNORMAL HIGH (ref 34–64)
NRBC: 0 (ref 0–0)
PLATELET: 209 10*3/uL (ref 140–450)
RBC: 4.47 M/uL (ref 3.80–5.70)
RDW-SD: 47 — ABNORMAL HIGH (ref 35.1–43.9)
WBC: 12.3 10*3/uL — ABNORMAL HIGH (ref 4.0–11.0)

## 2018-12-30 LAB — EKG, 12 LEAD, INITIAL
Atrial Rate: 99 {beats}/min
Calculated P Axis: 31 degrees
Calculated R Axis: -34 degrees
Calculated T Axis: 34 degrees
Diagnosis: NORMAL
P-R Interval: 172 ms
Q-T Interval: 356 ms
QRS Duration: 94 ms
QTC Calculation (Bezet): 456 ms
Ventricular Rate: 99 {beats}/min

## 2018-12-30 LAB — METABOLIC PANEL, BASIC
Anion gap: 5 mmol/L (ref 5–15)
BUN: 9 mg/dl (ref 7–25)
CO2: 29 mEq/L (ref 21–32)
Calcium: 8.4 mg/dl — ABNORMAL LOW (ref 8.5–10.1)
Chloride: 104 mEq/L (ref 98–107)
Creatinine: 0.7 mg/dl (ref 0.6–1.3)
GFR est AA: >60
GFR est non-AA: >60
Glucose: 99 mg/dl (ref 74–106)
Potassium: 3.6 mEq/L (ref 3.5–5.1)
Sodium: 138 mEq/L (ref 136–145)

## 2018-12-30 MED ORDER — SODIUM CHLORIDE 0.9 % IJ SYRG
Freq: Once | INTRAMUSCULAR | Status: DC
Start: 2018-12-30 — End: 2018-12-30

## 2018-12-30 NOTE — ED Notes (Signed)
I have reviewed discharge instructions with the patient.  The patient verbalized understanding. PT transported off unit via EMS to home with all belongings.

## 2018-12-30 NOTE — ED Notes (Addendum)
Transport arrived to pick up PT. Called [name] to inform her 20 min ETA to home.
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
## 2018-12-30 NOTE — ED Notes (Signed)
Called PT sister. Sister reports she unable to transport PT. Will arrange for EMS transport.
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
## 2018-12-30 NOTE — ED Notes (Signed)
I have reviewed discharge instructions with the patient.  The patient verbalized understanding. PT transported off unit via EMS to home with all belongings.

## 2018-12-30 NOTE — ED Notes (Signed)
Called PT sister. Sister reports she unable to transport PT. Will arrange for EMS transport.
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
## 2018-12-30 NOTE — ED Notes (Signed)
Transport arrived to pick up PT. Called [name] to inform her 20 min ETA to home.
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
## 2019-10-07 ENCOUNTER — Encounter

## 2020-07-30 DIAGNOSIS — I77810 Thoracic aortic ectasia: Secondary | ICD-10-CM | POA: Insufficient documentation

## 2020-08-28 DIAGNOSIS — G8114 Spastic hemiplegia affecting left nondominant side: Secondary | ICD-10-CM | POA: Insufficient documentation

## 2020-12-12 ENCOUNTER — Encounter

## 2020-12-19 ENCOUNTER — Encounter

## 2021-01-16 ENCOUNTER — Inpatient Hospital Stay: Admit: 2021-01-16 | Payer: MEDICARE | Primary: Family Medicine

## 2021-01-16 DIAGNOSIS — K219 Gastro-esophageal reflux disease without esophagitis: Secondary | ICD-10-CM

## 2021-01-16 DIAGNOSIS — I10 Essential (primary) hypertension: Secondary | ICD-10-CM
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
## 2021-09-09 ENCOUNTER — Encounter: Payer: Self-pay | Admitting: Emergency Medicine

## 2021-09-09 ENCOUNTER — Ambulatory Visit
Admission: EM | Admit: 2021-09-09 | Discharge: 2021-09-09 | Disposition: A | Discharge status: Home / Self Care | Payer: PRIVATE HEALTH INSURANCE

## 2021-09-09 ENCOUNTER — Ambulatory Visit
Admission: RE | Admit: 2021-09-09 | Discharge: 2021-09-09 | Disposition: A | Discharge status: Home / Self Care | Payer: Medicare (Managed Care) | Source: Ambulatory Visit | Attending: Urgent Care | Admitting: Urgent Care

## 2021-09-09 ENCOUNTER — Telehealth: Payer: Self-pay | Admitting: Urgent Care

## 2021-09-09 ENCOUNTER — Ambulatory Visit
Admission: RE | Admit: 2021-09-09 | Discharge: 2021-09-09 | Disposition: A | Discharge status: Home / Self Care | Payer: Medicare (Managed Care) | Attending: Urgent Care | Admitting: Urgent Care

## 2021-09-09 ENCOUNTER — Other Ambulatory Visit: Payer: Self-pay

## 2021-09-09 DIAGNOSIS — R059 Cough, unspecified: Secondary | ICD-10-CM | POA: Diagnosis present

## 2021-09-09 DIAGNOSIS — R82998 Other abnormal findings in urine: Secondary | ICD-10-CM

## 2021-09-09 DIAGNOSIS — R531 Weakness: Secondary | ICD-10-CM | POA: Insufficient documentation

## 2021-09-09 DIAGNOSIS — R051 Acute cough: Secondary | ICD-10-CM

## 2021-09-09 HISTORY — DX: Restless legs syndrome: G25.81

## 2021-09-09 HISTORY — DX: Gastro-esophageal reflux disease without esophagitis: K21.9

## 2021-09-09 HISTORY — DX: Essential (primary) hypertension: I10

## 2021-09-09 MED ORDER — BENZONATATE 100 MG PO CAPS: 100.0000 mg | ORAL_CAPSULE | Freq: Three times a day (TID) | ORAL | 0 refills | Status: DC | PRN

## 2021-09-09 MED ORDER — PROMETHAZINE-DM 6.25-15 MG/5ML PO SYRP: 5.0000 mL | ORAL_SOLUTION | Freq: Every evening | ORAL | 0 refills | Status: DC | PRN

## 2021-09-09 MED ORDER — ACETAMINOPHEN 325 MG PO TABS: 650.0000 mg | ORAL_TABLET | Freq: Four times a day (QID) | ORAL | 0 refills | Status: DC | PRN

## 2021-09-09 NOTE — Discharge Instructions (Addendum)
Head [place] on [address] toward [address].  Then turned left at the first [address] on to [address].  Continue on to [facility].  Turn left onto [address].  Turn right onto [address].    The address is [address] in [place], ZIP Code [phone].  The phone number is [phone].
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
## 2021-09-09 NOTE — Telephone Encounter (Signed)
DG Chest 2 View  Result Date: 09/09/2021 CLINICAL DATA:  Cough, weakness EXAM: CHEST - 2 VIEW COMPARISON:  None FINDINGS: Transverse diameter of heart is increased. Thoracic aorta is tortuous and ectatic. There is poor inspiration. There are no signs of pulmonary edema or focal pulmonary consolidation. Small linear densities are seen in the lower lung fields. Lateral CP angles are indistinct. There is no pneumothorax. Degenerative changes are noted in both shoulders. IMPRESSION: Cardiomegaly. There are no signs of pulmonary edema or focal pulmonary consolidation. Linear densities in the lower lung fields may suggest scarring or subsegmental atelectasis. Blunting of left lateral CP angle may suggest pleural thickening or minimal effusion. Electronically Signed   By: Elmer Picker M.D.   On: 09/09/2021 12:15    X-ray results reviewed. Will hold off on antibiotics. No history of smoking or respiratory disorders.  Recommended supportive care with cough suppression medications, Tylenol.  Reviewed strict ER precautions.  Follow-up with PCP.

## 2021-09-09 NOTE — ED Triage Notes (Signed)
Pt is visiting from [place] and has a cough x 1 week. He has increased weakness and urine is a dark color x 3 days.

## 2021-09-25 ENCOUNTER — Emergency Department: Payer: Medicare HMO

## 2021-09-25 ENCOUNTER — Other Ambulatory Visit: Payer: Self-pay

## 2021-09-25 ENCOUNTER — Observation Stay: Payer: Medicare HMO

## 2021-09-25 ENCOUNTER — Inpatient Hospital Stay
Admission: EM | Admit: 2021-09-25 | Discharge: 2021-10-07 | DRG: 312 | Disposition: A | Discharge status: Skilled Nursing Facility | Payer: Medicare HMO | Attending: Internal Medicine | Admitting: Internal Medicine

## 2021-09-25 DIAGNOSIS — R1310 Dysphagia, unspecified: Secondary | ICD-10-CM | POA: Diagnosis present

## 2021-09-25 DIAGNOSIS — R059 Cough, unspecified: Secondary | ICD-10-CM

## 2021-09-25 DIAGNOSIS — I10 Essential (primary) hypertension: Secondary | ICD-10-CM | POA: Diagnosis present

## 2021-09-25 DIAGNOSIS — G2581 Restless legs syndrome: Secondary | ICD-10-CM | POA: Diagnosis present

## 2021-09-25 DIAGNOSIS — E669 Obesity, unspecified: Secondary | ICD-10-CM

## 2021-09-25 DIAGNOSIS — Z6835 Body mass index (BMI) 35.0-35.9, adult: Secondary | ICD-10-CM

## 2021-09-25 DIAGNOSIS — I959 Hypotension, unspecified: Secondary | ICD-10-CM | POA: Diagnosis present

## 2021-09-25 DIAGNOSIS — Z20822 Contact with and (suspected) exposure to covid-19: Secondary | ICD-10-CM | POA: Diagnosis present

## 2021-09-25 DIAGNOSIS — N400 Enlarged prostate without lower urinary tract symptoms: Secondary | ICD-10-CM | POA: Diagnosis present

## 2021-09-25 DIAGNOSIS — Z7401 Bed confinement status: Secondary | ICD-10-CM

## 2021-09-25 DIAGNOSIS — R531 Weakness: Principal | ICD-10-CM

## 2021-09-25 DIAGNOSIS — J690 Pneumonitis due to inhalation of food and vomit: Secondary | ICD-10-CM | POA: Diagnosis present

## 2021-09-25 DIAGNOSIS — R55 Syncope and collapse: Secondary | ICD-10-CM | POA: Diagnosis not present

## 2021-09-25 DIAGNOSIS — Z9079 Acquired absence of other genital organ(s): Secondary | ICD-10-CM

## 2021-09-25 DIAGNOSIS — Z79899 Other long term (current) drug therapy: Secondary | ICD-10-CM

## 2021-09-25 DIAGNOSIS — E876 Hypokalemia: Secondary | ICD-10-CM | POA: Diagnosis present

## 2021-09-25 DIAGNOSIS — Z87820 Personal history of traumatic brain injury: Secondary | ICD-10-CM

## 2021-09-25 DIAGNOSIS — R0602 Shortness of breath: Secondary | ICD-10-CM | POA: Diagnosis not present

## 2021-09-25 DIAGNOSIS — T17308A Unspecified foreign body in larynx causing other injury, initial encounter: Secondary | ICD-10-CM

## 2021-09-25 DIAGNOSIS — K219 Gastro-esophageal reflux disease without esophagitis: Secondary | ICD-10-CM | POA: Diagnosis present

## 2021-09-25 DIAGNOSIS — R5381 Other malaise: Secondary | ICD-10-CM

## 2021-09-25 DIAGNOSIS — T501X5A Adverse effect of loop [high-ceiling] diuretics, initial encounter: Secondary | ICD-10-CM | POA: Diagnosis present

## 2021-09-25 LAB — RESP PANEL BY RT-PCR (FLU A&B, COVID) ARPGX2
Influenza A by PCR: NEGATIVE
Influenza B by PCR: NEGATIVE
SARS Coronavirus 2 by RT PCR: NEGATIVE

## 2021-09-25 LAB — URINALYSIS, ROUTINE W REFLEX MICROSCOPIC
Bilirubin Urine: NEGATIVE
Glucose, UA: NEGATIVE mg/dL
Hgb urine dipstick: NEGATIVE
Ketones, ur: 5 mg/dL — AB
Leukocytes,Ua: NEGATIVE
Nitrite: NEGATIVE
Protein, ur: NEGATIVE mg/dL
Specific Gravity, Urine: 1.021 (ref 1.005–1.030)
pH: 7 (ref 5.0–8.0)

## 2021-09-25 LAB — TSH: TSH: 2.219 u[IU]/mL (ref 0.350–4.500)

## 2021-09-25 LAB — CBC WITH DIFFERENTIAL/PLATELET
Abs Immature Granulocytes: 0.03 10*3/uL (ref 0.00–0.07)
Basophils Absolute: 0 10*3/uL (ref 0.0–0.1)
Basophils Relative: 0 %
Eosinophils Absolute: 0.1 10*3/uL (ref 0.0–0.5)
Eosinophils Relative: 1 %
HCT: 41.5 % (ref 39.0–52.0)
Hemoglobin: 13.6 g/dL (ref 13.0–17.0)
Immature Granulocytes: 0 %
Lymphocytes Relative: 12 %
Lymphs Abs: 1.2 10*3/uL (ref 0.7–4.0)
MCH: 30.1 pg (ref 26.0–34.0)
MCHC: 32.8 g/dL (ref 30.0–36.0)
MCV: 91.8 fL (ref 80.0–100.0)
Monocytes Absolute: 1.5 10*3/uL — ABNORMAL HIGH (ref 0.1–1.0)
Monocytes Relative: 15 %
Neutro Abs: 6.8 10*3/uL (ref 1.7–7.7)
Neutrophils Relative %: 72 %
Platelets: 215 10*3/uL (ref 150–400)
RBC: 4.52 MIL/uL (ref 4.22–5.81)
RDW: 13.6 % (ref 11.5–15.5)
WBC: 9.6 10*3/uL (ref 4.0–10.5)
nRBC: 0 % (ref 0.0–0.2)

## 2021-09-25 LAB — COMPREHENSIVE METABOLIC PANEL
ALT: 28 U/L (ref 0–44)
AST: 32 U/L (ref 15–41)
Albumin: 3.7 g/dL (ref 3.5–5.0)
Alkaline Phosphatase: 77 U/L (ref 38–126)
Anion gap: 9 (ref 5–15)
BUN: 12 mg/dL (ref 8–23)
CO2: 26 mmol/L (ref 22–32)
Calcium: 8.5 mg/dL — ABNORMAL LOW (ref 8.9–10.3)
Chloride: 103 mmol/L (ref 98–111)
Creatinine, Ser: 0.73 mg/dL (ref 0.61–1.24)
GFR, Estimated: >60 mL/min (ref >60)
Glucose, Bld: 98 mg/dL (ref 70–99)
Potassium: 3.6 mmol/L (ref 3.5–5.1)
Sodium: 138 mmol/L (ref 135–145)
Total Bilirubin: 0.9 mg/dL (ref 0.3–1.2)
Total Protein: 7.5 g/dL (ref 6.5–8.1)

## 2021-09-25 LAB — LACTIC ACID, PLASMA: Lactic Acid, Venous: 1.5 mmol/L (ref 0.5–1.9)

## 2021-09-25 LAB — TROPONIN I (HIGH SENSITIVITY)
Troponin I (High Sensitivity): 19 ng/L — ABNORMAL HIGH (ref <18)
Troponin I (High Sensitivity): 26 ng/L — ABNORMAL HIGH (ref <18)

## 2021-09-25 LAB — BRAIN NATRIURETIC PEPTIDE: B Natriuretic Peptide: 76.8 pg/mL (ref 0.0–100.0)

## 2021-09-25 MED ORDER — PANTOPRAZOLE SODIUM 40 MG PO TBEC
40.0000 mg | DELAYED_RELEASE_TABLET | Freq: Every day | ORAL | Status: DC
Start: 2021-09-26 — End: 2021-10-02
  Administered 2021-09-26 – 2021-10-01 (×6): 40 mg via ORAL
  Filled 2021-09-25 (×6): qty 1

## 2021-09-25 MED ORDER — LOSARTAN POTASSIUM 50 MG PO TABS: 100.0000 mg | ORAL_TABLET | Freq: Every day | ORAL | Status: DC

## 2021-09-25 MED ORDER — METOPROLOL SUCCINATE ER 25 MG PO TB24
25.0000 mg | ORAL_TABLET | Freq: Two times a day (BID) | ORAL | Status: DC
Start: 2021-09-26 — End: 2021-09-26

## 2021-09-25 MED ORDER — BACLOFEN 10 MG PO TABS
40.0000 mg | ORAL_TABLET | Freq: Three times a day (TID) | ORAL | Status: DC
  Administered 2021-09-26 – 2021-09-27 (×7): 40 mg via ORAL
  Filled 2021-09-25 (×9): qty 4

## 2021-09-25 MED ORDER — HYDROCHLOROTHIAZIDE 12.5 MG PO CAPS
12.5000 mg | ORAL_CAPSULE | Freq: Every day | ORAL | Status: DC
  Filled 2021-09-25: qty 1

## 2021-09-25 MED ORDER — ADULT MULTIVITAMIN W/MINERALS CH
1.0000 | ORAL_TABLET | Freq: Every day | ORAL | Status: DC
  Administered 2021-09-26 – 2021-10-07 (×11): 1 via ORAL
  Filled 2021-09-25 (×11): qty 1

## 2021-09-25 MED ORDER — ALBUTEROL SULFATE HFA 108 (90 BASE) MCG/ACT IN AERS: 2.0000 | INHALATION_SPRAY | RESPIRATORY_TRACT | Status: DC | PRN

## 2021-09-25 MED ORDER — DAILY VITES PO TABS: 1.0000 | ORAL_TABLET | Freq: Every day | ORAL | Status: DC

## 2021-09-25 MED ORDER — TIZANIDINE HCL 4 MG PO TABS
6.0000 mg | ORAL_TABLET | Freq: Three times a day (TID) | ORAL | Status: DC
  Administered 2021-09-26 – 2021-10-07 (×35): 6 mg via ORAL
  Filled 2021-09-25 (×40): qty 2

## 2021-09-25 MED ORDER — PROMETHAZINE-DM 6.25-15 MG/5ML PO SYRP: 5.0000 mL | ORAL_SOLUTION | Freq: Every evening | ORAL | Status: DC | PRN

## 2021-09-25 MED ORDER — POLYETHYLENE GLYCOL 3350 17 GM/SCOOP PO POWD
17.0000 g | Freq: Every day | ORAL | Status: DC
  Filled 2021-09-25: qty 255

## 2021-09-25 MED ORDER — GUAIFENESIN ER 600 MG PO TB12
600.0000 mg | ORAL_TABLET | Freq: Two times a day (BID) | ORAL | Status: DC
  Administered 2021-09-26 (×2): 600 mg via ORAL
  Filled 2021-09-25 (×2): qty 1

## 2021-09-25 MED ORDER — BENZONATATE 100 MG PO CAPS: 200.0000 mg | ORAL_CAPSULE | Freq: Three times a day (TID) | ORAL | Status: DC | PRN

## 2021-09-25 MED ORDER — ENOXAPARIN SODIUM 80 MG/0.8ML IJ SOSY
0.5000 mg/kg | PREFILLED_SYRINGE | INTRAMUSCULAR | Status: DC
  Administered 2021-09-26 – 2021-10-03 (×9): 67.5 mg via SUBCUTANEOUS
  Filled 2021-09-25 (×10): qty 0.68

## 2021-09-25 MED ORDER — DULOXETINE HCL 30 MG PO CPEP
60.0000 mg | ORAL_CAPSULE | Freq: Every day | ORAL | Status: DC
  Administered 2021-09-26 – 2021-10-06 (×11): 60 mg via ORAL
  Filled 2021-09-25 (×12): qty 2

## 2021-09-25 MED ORDER — DANTROLENE SODIUM 100 MG PO CAPS
100.0000 mg | ORAL_CAPSULE | Freq: Two times a day (BID) | ORAL | Status: DC
  Administered 2021-09-26 – 2021-10-07 (×23): 100 mg via ORAL
  Filled 2021-09-25 (×27): qty 1

## 2021-09-25 MED ORDER — FUROSEMIDE 10 MG/ML IJ SOLN
40.0000 mg | Freq: Once | INTRAMUSCULAR | Status: AC
  Administered 2021-09-26: 40 mg via INTRAVENOUS
  Filled 2021-09-25: qty 4

## 2021-09-25 MED ORDER — ALBUTEROL SULFATE (2.5 MG/3ML) 0.083% IN NEBU
2.5000 mg | INHALATION_SOLUTION | Freq: Three times a day (TID) | RESPIRATORY_TRACT | Status: DC
  Administered 2021-09-26 – 2021-09-27 (×6): 2.5 mg via RESPIRATORY_TRACT
  Filled 2021-09-25 (×6): qty 3

## 2021-09-25 NOTE — ED Notes (Signed)
RN having difficulty obtaining IV access. IV team consult placed.  ?

## 2021-09-25 NOTE — ED Notes (Signed)
Pt educated on need for UA, pt states he is continent and will give UA when he can. Male purewick placed. Warm Blanket provided. Pants and long sleeve shirt taken off for comfort.  ?

## 2021-09-25 NOTE — ED Notes (Signed)
Ultrasound at beside.  

## 2021-09-25 NOTE — ED Notes (Signed)
Charge RN in room to speak with pts family about bed situation. Pt family wanting to know when patient will given a bed upstairs. Pt family member informed that we do not know when pt will be given a bed upstairs as there are several people waiting for a bed. Pt family member stated that we needed to call someone to find out when pt would be given a bed and if we could not give him one here we needed to transfer him somewhere else.  ? ?Pt family informed by this RN that bed placement is not able to tell us when pt will be given a bed because there are several people waiting for bed and several people who have been waiting longer than him. RN informed family that we could discuss with MD the possibility of transfer; however, given the fact and we have the ability to treat the patient here, it is not likely that he will be accepted at another hospital. Pt family member states that pt has been here all day in the stretcher and does even have a pillow. When asked if he had requested a pillow the family stated that he had not. Pt and family was informed that we would work on getting patient into a more comfortable bed and that we would get him a pillow. Pt and family were reassured that pt will be getting all of the same care while in the ED that he would be getting on the floor. RN was then called out of the room to attend to emergency in another room.  ? ?Secretary was informed to request a hospital bed for patient.  ?
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
## 2021-09-25 NOTE — Progress Notes (Signed)
PHARMACIST - PHYSICIAN COMMUNICATION ? ?CONCERNING:  Enoxaparin (Lovenox) for DVT Prophylaxis  ? ?DESCRIPTION: ?Patient was prescribed enoxaprin 40mg  q24 hours for VTE prophylaxis.  ? Danley Danker Weights  ? 09/25/21 0919  ?Weight: 133.6 kg (294 lb 8 oz)  ? ? ?Body mass index is 35.85 kg/m?. ? ?Estimated Creatinine Clearance: 137.4 mL/min (by C-G formula based on SCr of 0.73 mg/dL). ? ? ?Based on [place] patient is candidate for enoxaparin 0.5mg /kg TBW SQ every 24 hours based on BMI being >30. ? ?RECOMMENDATION: ?Pharmacy has adjusted enoxaparin dose per [facility] policy. ? ?Patient is now receiving enoxaparin 67.5 mg every 24 hours  ? ? ?Darnelle Bos, PharmD ?Clinical Pharmacist  ?09/25/2021 ?4:55 PM ? ?
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
## 2021-09-25 NOTE — ED Notes (Signed)
Lab at bedside obtaining repeat trop  ?

## 2021-09-25 NOTE — ED Notes (Signed)
IV team at bedside 

## 2021-09-26 ENCOUNTER — Observation Stay (HOSPITAL_COMMUNITY)
Admit: 2021-09-26 | Discharge: 2021-09-26 | Disposition: A | Discharge status: Home / Self Care | Payer: Medicare HMO | Attending: Family Medicine | Admitting: Family Medicine

## 2021-09-26 DIAGNOSIS — I959 Hypotension, unspecified: Secondary | ICD-10-CM | POA: Diagnosis present

## 2021-09-26 DIAGNOSIS — Z87820 Personal history of traumatic brain injury: Secondary | ICD-10-CM | POA: Diagnosis not present

## 2021-09-26 DIAGNOSIS — T501X5A Adverse effect of loop [high-ceiling] diuretics, initial encounter: Secondary | ICD-10-CM | POA: Diagnosis present

## 2021-09-26 DIAGNOSIS — R1312 Dysphagia, oropharyngeal phase: Secondary | ICD-10-CM | POA: Diagnosis not present

## 2021-09-26 DIAGNOSIS — R1310 Dysphagia, unspecified: Secondary | ICD-10-CM | POA: Diagnosis present

## 2021-09-26 DIAGNOSIS — J690 Pneumonitis due to inhalation of food and vomit: Secondary | ICD-10-CM | POA: Diagnosis present

## 2021-09-26 DIAGNOSIS — R052 Subacute cough: Secondary | ICD-10-CM | POA: Diagnosis not present

## 2021-09-26 DIAGNOSIS — I517 Cardiomegaly: Secondary | ICD-10-CM | POA: Diagnosis not present

## 2021-09-26 DIAGNOSIS — R55 Syncope and collapse: Secondary | ICD-10-CM | POA: Diagnosis present

## 2021-09-26 DIAGNOSIS — E669 Obesity, unspecified: Secondary | ICD-10-CM | POA: Diagnosis not present

## 2021-09-26 DIAGNOSIS — Z7401 Bed confinement status: Secondary | ICD-10-CM | POA: Diagnosis not present

## 2021-09-26 DIAGNOSIS — R5381 Other malaise: Secondary | ICD-10-CM

## 2021-09-26 DIAGNOSIS — I10 Essential (primary) hypertension: Secondary | ICD-10-CM | POA: Diagnosis present

## 2021-09-26 DIAGNOSIS — K219 Gastro-esophageal reflux disease without esophagitis: Secondary | ICD-10-CM | POA: Diagnosis present

## 2021-09-26 DIAGNOSIS — N400 Enlarged prostate without lower urinary tract symptoms: Secondary | ICD-10-CM | POA: Diagnosis present

## 2021-09-26 DIAGNOSIS — E876 Hypokalemia: Secondary | ICD-10-CM | POA: Diagnosis present

## 2021-09-26 DIAGNOSIS — Z6835 Body mass index (BMI) 35.0-35.9, adult: Secondary | ICD-10-CM | POA: Diagnosis not present

## 2021-09-26 DIAGNOSIS — R1319 Other dysphagia: Secondary | ICD-10-CM | POA: Diagnosis not present

## 2021-09-26 DIAGNOSIS — Z20822 Contact with and (suspected) exposure to covid-19: Secondary | ICD-10-CM | POA: Diagnosis present

## 2021-09-26 DIAGNOSIS — Z9079 Acquired absence of other genital organ(s): Secondary | ICD-10-CM | POA: Diagnosis not present

## 2021-09-26 DIAGNOSIS — Z515 Encounter for palliative care: Secondary | ICD-10-CM | POA: Diagnosis not present

## 2021-09-26 DIAGNOSIS — R0602 Shortness of breath: Secondary | ICD-10-CM | POA: Diagnosis present

## 2021-09-26 DIAGNOSIS — Z7189 Other specified counseling: Secondary | ICD-10-CM | POA: Diagnosis not present

## 2021-09-26 DIAGNOSIS — R531 Weakness: Secondary | ICD-10-CM | POA: Diagnosis not present

## 2021-09-26 DIAGNOSIS — G2581 Restless legs syndrome: Secondary | ICD-10-CM | POA: Diagnosis present

## 2021-09-26 DIAGNOSIS — R059 Cough, unspecified: Secondary | ICD-10-CM

## 2021-09-26 DIAGNOSIS — Z79899 Other long term (current) drug therapy: Secondary | ICD-10-CM | POA: Diagnosis not present

## 2021-09-26 LAB — BASIC METABOLIC PANEL
Anion gap: 8 (ref 5–15)
BUN: 11 mg/dL (ref 8–23)
CO2: 30 mmol/L (ref 22–32)
Calcium: 8.3 mg/dL — ABNORMAL LOW (ref 8.9–10.3)
Chloride: 101 mmol/L (ref 98–111)
Creatinine, Ser: 0.63 mg/dL (ref 0.61–1.24)
GFR, Estimated: >60 mL/min (ref >60)
Glucose, Bld: 111 mg/dL — ABNORMAL HIGH (ref 70–99)
Potassium: 2.8 mmol/L — ABNORMAL LOW (ref 3.5–5.1)
Sodium: 139 mmol/L (ref 135–145)

## 2021-09-26 LAB — CBC
HCT: 38 % — ABNORMAL LOW (ref 39.0–52.0)
Hemoglobin: 12.7 g/dL — ABNORMAL LOW (ref 13.0–17.0)
MCH: 30 pg (ref 26.0–34.0)
MCHC: 33.4 g/dL (ref 30.0–36.0)
MCV: 89.6 fL (ref 80.0–100.0)
Platelets: 204 10*3/uL (ref 150–400)
RBC: 4.24 MIL/uL (ref 4.22–5.81)
RDW: 13.3 % (ref 11.5–15.5)
WBC: 8.8 10*3/uL (ref 4.0–10.5)
nRBC: 0 % (ref 0.0–0.2)

## 2021-09-26 LAB — ECHOCARDIOGRAM COMPLETE
Height: 76 in
S' Lateral: 2.6 cm
Weight: 4712 oz

## 2021-09-26 LAB — MAGNESIUM: Magnesium: 1.9 mg/dL (ref 1.7–2.4)

## 2021-09-26 LAB — TROPONIN I (HIGH SENSITIVITY)
Troponin I (High Sensitivity): 17 ng/L (ref <18)
Troponin I (High Sensitivity): 17 ng/L (ref <18)

## 2021-09-26 LAB — PROCALCITONIN: Procalcitonin: <0.1 ng/mL

## 2021-09-26 LAB — LACTIC ACID, PLASMA: Lactic Acid, Venous: 0.8 mmol/L (ref 0.5–1.9)

## 2021-09-26 LAB — HIV ANTIBODY (ROUTINE TESTING W REFLEX): HIV Screen 4th Generation wRfx: NONREACTIVE

## 2021-09-26 MED ORDER — POTASSIUM CHLORIDE CRYS ER 20 MEQ PO TBCR
40.0000 meq | EXTENDED_RELEASE_TABLET | ORAL | Status: AC
  Administered 2021-09-26 (×3): 40 meq via ORAL
  Filled 2021-09-26 (×3): qty 2

## 2021-09-26 MED ORDER — POLYETHYLENE GLYCOL 3350 17 G PO PACK
17.0000 g | PACK | Freq: Every day | ORAL | Status: DC
  Administered 2021-09-27 – 2021-10-06 (×10): 17 g via ORAL
  Filled 2021-09-26 (×12): qty 1

## 2021-09-26 MED ORDER — GUAIFENESIN ER 600 MG PO TB12
1200.0000 mg | ORAL_TABLET | Freq: Two times a day (BID) | ORAL | Status: DC
Start: 2021-09-26 — End: 2021-10-07
  Administered 2021-09-26 – 2021-10-07 (×21): 1200 mg via ORAL
  Filled 2021-09-26 (×21): qty 2

## 2021-09-26 MED ORDER — ACETAMINOPHEN 500 MG PO TABS
1000.0000 mg | ORAL_TABLET | Freq: Once | ORAL | Status: AC
  Administered 2021-09-26: 1000 mg via ORAL
  Filled 2021-09-26: qty 2

## 2021-09-26 NOTE — Progress Notes (Signed)
*  PRELIMINARY RESULTS* ?Echocardiogram ?2D Echocardiogram has been performed. ? ?Austin Conner, [name] ?09/26/2021, 12:45 PM ?
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
## 2021-09-26 NOTE — Assessment & Plan Note (Addendum)
Recently seen at urgent care for ongoing cough productive of white frothy phlegm.  Was diagnosed with upper respiratory tract infection.  Chest x-ray on admission showed cardiomegaly, mild vascular congestion.  COVID screen test negative. CXR repeated 3/6 PM showed Low lung volume with patchy right greater than left basilar opacity, atelectasis versus mild pneumonia. - flutter valve and incentive spirometer.  - Started on empiric Rocephin on 3/7 --Change antibiotic to Unasyn on 3/9 -- Now on PO Augmentin to complete 7 days -- Monitor fever curve and CBC - Continue home Tessalon Perles, Mucinex.  3/9: Patient had recurrent aspiration and difficulty with clearing secretions.  See RN notes. --SLP for swallow evaluation, resumed on dysphagia 2 diet with thin liquids --Aspiration precautions

## 2021-09-26 NOTE — Assessment & Plan Note (Addendum)
BMI of 35.8 ?Complicates overall care and prognosis.  Recommend lifestyle modifications including physical activity and diet for weight loss and overall long-term health. ? ?
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
## 2021-09-26 NOTE — TOC Initial Note (Signed)
Transition of Care (TOC) - Initial/Assessment Note  ? ? ?Patient Details  ?Name: Austin Conner ?MRN: [id number] ?Date of Birth: 03/17/1956 ? ?Transition of Care (TOC) CM/SW Contact:    ?Conception Oms, RN ?Phone Number: ?09/26/2021, 10:31 AM ? ?Clinical Narrative:        He lives with his sister who is his primary caretaker.  They recently moved from [place] to be closer to family about 3 to 4 weeks ago.  He is mostly bedbound and requires significant assistance with ADLs.  Has a hospital bed at home and a bedside commode.  Uses motorized wheelchair to assist with ambulation.  ? ?TOC will continue to follow and assist with DC and needs         ? ? ?  ?  ? ? ?Patient Goals and CMS Choice ?  ?  ?  ? ?Expected Discharge Plan and Services ?  ?  ?  ?  ?  ?                ?  ?  ?  ?  ?  ?  ?  ?  ?  ?  ? ?Prior Living Arrangements/Services ?  ?  ?  ?       ?  ?  ?  ?  ? ?Activities of Daily Living ?Home Assistive Devices/Equipment: Bedside commode/3-in-1, Wheelchair ?ADL Screening (condition at time of admission) ?Patient's cognitive ability adequate to safely complete daily activities?: Yes ?Is the patient deaf or have difficulty hearing?: No ?Does the patient have difficulty seeing, even when wearing glasses/contacts?: No ?Does the patient have difficulty concentrating, remembering, or making decisions?: No ?Patient able to express need for assistance with ADLs?: Yes ?Does the patient have difficulty dressing or bathing?: Yes ?Independently performs ADLs?: No ?Does the patient have difficulty walking or climbing stairs?: Yes ?Weakness of Legs: Both ?Weakness of Arms/Hands: Both ? ?Permission Sought/Granted ?  ?  ?   ?   ?   ?   ? ?Emotional Assessment ?  ?  ?  ?  ?  ?  ? ?Admission diagnosis:  Shortness of breath [R06.02] ?Syncope [R55] ?Weakness [R53.1] ?Syncope, unspecified syncope type [R55] ?Patient Active Problem List  ? Diagnosis Date Noted  ? Syncope 09/25/2021  ? ?PCP:  System, Provider Not In ?Pharmacy:    ?[facility] [place] ?[place] [address] ?Phone: [phone] Fax: [phone] ? ? ? ? ?Social Determinants of Health (SDOH) Interventions ?  ? ?Readmission Risk Interventions ?No flowsheet data found. ? ? ?

## 2021-09-26 NOTE — ED Notes (Signed)
This RN contacted the provider to discuss the patients tachycardia - awaiting orders.  ?

## 2021-09-26 NOTE — ED Notes (Signed)
RN to the bedside to assist pt with urinal - pericare performed and a rectal temp obtained due to the patient feeling warm to touch. Rectal 102.5; Provider made aware. See orders for intervention.  ?
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
## 2021-09-26 NOTE — Assessment & Plan Note (Addendum)
Speech therapy following.  Takes dysphagia 3 diet at home.  Sister requested nectar thick liquids (SLP had cleared for thin). 3/9: Made n.p.o. due to recurrent aspiration difficulty with clearing secretions overnight.  Seen by SLP and resumed on dysphagia 2 diet with thin liquids -- Off maintenance IV fluids -- SLP evaluation, started on dysphagia 2 diet with thin liquids

## 2021-09-26 NOTE — Progress Notes (Signed)
Eeg done 

## 2021-09-26 NOTE — Assessment & Plan Note (Addendum)
Likely secondary to Lasix therapy.  Potassium of 2.8 on admission.  ?Resolved with replacement and stable. ?Monitor BMP and replace K as needed ?
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
## 2021-09-26 NOTE — Procedures (Signed)
Routine EEG Report ? ?Austin Conner is a 66 y.o. male with a history of syncope who is undergoing an EEG to evaluate for seizures. ? ?Report: This EEG was acquired with electrodes placed according to the International 10-20 electrode system (including Fp1, Fp2, F3, F4, C3, C4, P3, P4, O1, O2, T3, T4, T5, T6, A1, A2, Fz, Cz, Pz). The following electrodes were missing or displaced: none. ? ?The occipital dominant rhythm was 9 Hz. This activity is reactive to stimulation. EEG showed intermittent runs of higher amplitude alpha activity throughout the recording lasting up to 5 seconds that did not appear epileptiform. Drowsiness was manifested by background fragmentation; deeper stages of sleep were identified by K complexes and sleep spindles. There was no focal slowing. There were no interictal epileptiform discharges. There were no electrographic seizures identified. There was no abnormal response to photic stimulation or hyperventilation.  ? ?Impression: This EEG was obtained while awake and asleep and is normal. ?   ?Clinical Correlation: Normal EEGs, however, do not rule out epilepsy. ? ?Su Monks, MD ?[facility] ?[phone] ? ?If 7pm- 7am, please page neurology on call as listed in [place]. ? ?

## 2021-09-26 NOTE — Progress Notes (Signed)
Cross Cover ?Dose of acetaminophen and cooling blanket ordered for temp 102.5 ?
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
## 2021-09-26 NOTE — Assessment & Plan Note (Addendum)
Takes hydrochlorothiazide, Cozaar, Toprol at home - held on admission & while BP's soft.   --Resumed losartan, metoprolol  --Increased metoprolol to 50 mg twice daily given stable baseline tachycardia and mildly elevated BP --Stop HCTZ at discharge

## 2021-09-26 NOTE — ED Notes (Addendum)
SLP eval requested by this RN from provider due to the patient having coughing spells after consuming PO medication.  ? ?Pt was placed on 2L [place] after his O2 levels decreased to 88% after taking meds. O2 improved to 99%.  ?

## 2021-09-26 NOTE — Assessment & Plan Note (Addendum)
Unclear etiology, suspect vasovagal, possibly cough-induced.? ?Hemodynamically stable. ?Orthostatic vitals are negative.? ?Carotid Doppler US negative for any significant stenosis. ?EEG unremarkable.  ?No reoccurrence since admission. ?Monitor. ?

## 2021-09-26 NOTE — Plan of Care (Signed)
Patient arrived to room 148 in stable condition. Aox4. No complaints of pain or discomfort. Plan of care reviewed with patient. Room/unit orientation completed. Call bell within reach. Bed in lowest position. ? ? ? ?Problem: Education: ?Goal: Knowledge of General Education information will improve ?Description: Including pain rating scale, medication(s)/side effects and non-pharmacologic comfort measures ?Outcome: Progressing ?  ?Problem: Health Behavior/Discharge Planning: ?Goal: Ability to manage health-related needs will improve ?Outcome: Progressing ?  ?Problem: Clinical Measurements: ?Goal: Ability to maintain clinical measurements within normal limits will improve ?Outcome: Progressing ?Goal: Will remain free from infection ?Outcome: Progressing ?Goal: Diagnostic test results will improve ?Outcome: Progressing ?Goal: Respiratory complications will improve ?Outcome: Progressing ?Goal: Cardiovascular complication will be avoided ?Outcome: Progressing ?  ?Problem: Activity: ?Goal: Risk for activity intolerance will decrease ?Outcome: Progressing ?  ?Problem: Nutrition: ?Goal: Adequate nutrition will be maintained ?Outcome: Progressing ?  ?Problem: Coping: ?Goal: Level of anxiety will decrease ?Outcome: Progressing ?  ?Problem: Elimination: ?Goal: Will not experience complications related to bowel motility ?Outcome: Progressing ?Goal: Will not experience complications related to urinary retention ?Outcome: Progressing ?  ?Problem: Pain Managment: ?Goal: General experience of comfort will improve ?Outcome: Progressing ?  ?Problem: Safety: ?Goal: Ability to remain free from injury will improve ?Outcome: Progressing ?  ?Problem: Skin Integrity: ?Goal: Risk for impaired skin integrity will decrease ?Outcome: Progressing ?  ?
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
## 2021-09-27 DIAGNOSIS — R052 Subacute cough: Secondary | ICD-10-CM | POA: Diagnosis not present

## 2021-09-27 DIAGNOSIS — R1319 Other dysphagia: Secondary | ICD-10-CM

## 2021-09-27 DIAGNOSIS — R531 Weakness: Secondary | ICD-10-CM

## 2021-09-27 DIAGNOSIS — I10 Essential (primary) hypertension: Secondary | ICD-10-CM

## 2021-09-27 DIAGNOSIS — E669 Obesity, unspecified: Secondary | ICD-10-CM

## 2021-09-27 DIAGNOSIS — E876 Hypokalemia: Secondary | ICD-10-CM

## 2021-09-27 DIAGNOSIS — R0602 Shortness of breath: Secondary | ICD-10-CM

## 2021-09-27 LAB — BASIC METABOLIC PANEL
Anion gap: 10 (ref 5–15)
BUN: 9 mg/dL (ref 8–23)
CO2: 28 mmol/L (ref 22–32)
Calcium: 8.5 mg/dL — ABNORMAL LOW (ref 8.9–10.3)
Chloride: 104 mmol/L (ref 98–111)
Creatinine, Ser: 0.53 mg/dL — ABNORMAL LOW (ref 0.61–1.24)
GFR, Estimated: >60 mL/min (ref >60)
Glucose, Bld: 112 mg/dL — ABNORMAL HIGH (ref 70–99)
Potassium: 3.7 mmol/L (ref 3.5–5.1)
Sodium: 142 mmol/L (ref 135–145)

## 2021-09-27 MED ORDER — METOPROLOL SUCCINATE ER 25 MG PO TB24
12.5000 mg | ORAL_TABLET | Freq: Two times a day (BID) | ORAL | Status: DC
  Administered 2021-09-27 – 2021-09-28 (×3): 12.5 mg via ORAL
  Filled 2021-09-27 (×3): qty 1

## 2021-09-27 MED ORDER — LOSARTAN POTASSIUM 50 MG PO TABS
50.0000 mg | ORAL_TABLET | Freq: Every day | ORAL | Status: DC
  Administered 2021-09-27 – 2021-09-28 (×2): 50 mg via ORAL
  Filled 2021-09-27 (×2): qty 1
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
## 2021-09-27 NOTE — Progress Notes (Signed)
Inpatient Rehab Admissions Coordinator:  ? ?I spoke with Pt.'s sister regarding potential CIR admit. She is aware that insurance may not approve, but would like to pursue insurance auth for CIR. She reports that she can provide 24/7 mod assist.  I will send Pt.'s case to insurance and pursue for CIR.  ? ?Clemens Catholic, MS, CCC-SLP ?Rehab Admissions Coordinator  ?[phone] (celll) ?[phone] (office) ? ?
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
## 2021-09-27 NOTE — Plan of Care (Signed)
Patient sleeping between care. No new changes in assessment. ? ? ? ?Problem: Education: ?Goal: Knowledge of General Education information will improve ?Description: Including pain rating scale, medication(s)/side effects and non-pharmacologic comfort measures ?Outcome: Progressing ?  ?Problem: Health Behavior/Discharge Planning: ?Goal: Ability to manage health-related needs will improve ?Outcome: Progressing ?  ?Problem: Clinical Measurements: ?Goal: Ability to maintain clinical measurements within normal limits will improve ?Outcome: Progressing ?Goal: Will remain free from infection ?Outcome: Progressing ?Goal: Diagnostic test results will improve ?Outcome: Progressing ?Goal: Respiratory complications will improve ?Outcome: Progressing ?Goal: Cardiovascular complication will be avoided ?Outcome: Progressing ?  ?Problem: Activity: ?Goal: Risk for activity intolerance will decrease ?Outcome: Progressing ?  ?Problem: Nutrition: ?Goal: Adequate nutrition will be maintained ?Outcome: Progressing ?  ?Problem: Coping: ?Goal: Level of anxiety will decrease ?Outcome: Progressing ?  ?Problem: Elimination: ?Goal: Will not experience complications related to bowel motility ?Outcome: Progressing ?Goal: Will not experience complications related to urinary retention ?Outcome: Progressing ?  ?Problem: Pain Managment: ?Goal: General experience of comfort will improve ?Outcome: Progressing ?  ?Problem: Safety: ?Goal: Ability to remain free from injury will improve ?Outcome: Progressing ?  ?Problem: Skin Integrity: ?Goal: Risk for impaired skin integrity will decrease ?Outcome: Progressing ?  ?

## 2021-09-28 DIAGNOSIS — R052 Subacute cough: Secondary | ICD-10-CM | POA: Diagnosis not present

## 2021-09-28 DIAGNOSIS — R531 Weakness: Secondary | ICD-10-CM | POA: Diagnosis not present

## 2021-09-28 DIAGNOSIS — R5381 Other malaise: Secondary | ICD-10-CM | POA: Diagnosis not present

## 2021-09-28 DIAGNOSIS — R1310 Dysphagia, unspecified: Secondary | ICD-10-CM

## 2021-09-28 MED ORDER — BACLOFEN 10 MG PO TABS
10.0000 mg | ORAL_TABLET | Freq: Two times a day (BID) | ORAL | Status: DC
  Administered 2021-09-28 – 2021-10-01 (×7): 10 mg via ORAL
  Filled 2021-09-28 (×7): qty 1

## 2021-09-28 MED ORDER — METOPROLOL SUCCINATE ER 25 MG PO TB24
25.0000 mg | ORAL_TABLET | Freq: Two times a day (BID) | ORAL | Status: DC
  Administered 2021-09-28 – 2021-10-05 (×13): 25 mg via ORAL
  Filled 2021-09-28 (×13): qty 1

## 2021-09-28 MED ORDER — ALBUTEROL SULFATE (2.5 MG/3ML) 0.083% IN NEBU: 2.5000 mg | INHALATION_SOLUTION | Freq: Four times a day (QID) | RESPIRATORY_TRACT | Status: DC | PRN

## 2021-09-28 MED ORDER — LOSARTAN POTASSIUM 50 MG PO TABS
100.0000 mg | ORAL_TABLET | Freq: Every day | ORAL | Status: DC
  Administered 2021-09-29 – 2021-10-07 (×8): 100 mg via ORAL
  Filled 2021-09-28 (×8): qty 2
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
## 2021-09-29 ENCOUNTER — Inpatient Hospital Stay: Payer: Medicare HMO

## 2021-09-29 DIAGNOSIS — R1319 Other dysphagia: Secondary | ICD-10-CM | POA: Diagnosis not present

## 2021-09-29 DIAGNOSIS — R052 Subacute cough: Secondary | ICD-10-CM | POA: Diagnosis not present

## 2021-09-29 DIAGNOSIS — R5381 Other malaise: Secondary | ICD-10-CM | POA: Diagnosis not present

## 2021-09-29 DIAGNOSIS — I10 Essential (primary) hypertension: Secondary | ICD-10-CM | POA: Diagnosis not present

## 2021-09-29 LAB — BASIC METABOLIC PANEL
Anion gap: 10 (ref 5–15)
BUN: 15 mg/dL (ref 8–23)
CO2: 26 mmol/L (ref 22–32)
Calcium: 8.5 mg/dL — ABNORMAL LOW (ref 8.9–10.3)
Chloride: 103 mmol/L (ref 98–111)
Creatinine, Ser: 0.63 mg/dL (ref 0.61–1.24)
GFR, Estimated: >60 mL/min (ref >60)
Glucose, Bld: 119 mg/dL — ABNORMAL HIGH (ref 70–99)
Potassium: 3.8 mmol/L (ref 3.5–5.1)
Sodium: 139 mmol/L (ref 135–145)

## 2021-09-29 LAB — CBC
HCT: 43.3 % (ref 39.0–52.0)
Hemoglobin: 14.3 g/dL (ref 13.0–17.0)
MCH: 29.3 pg (ref 26.0–34.0)
MCHC: 33 g/dL (ref 30.0–36.0)
MCV: 88.7 fL (ref 80.0–100.0)
Platelets: 243 10*3/uL (ref 150–400)
RBC: 4.88 MIL/uL (ref 4.22–5.81)
RDW: 13.2 % (ref 11.5–15.5)
WBC: 13.2 10*3/uL — ABNORMAL HIGH (ref 4.0–10.5)
nRBC: 0 % (ref 0.0–0.2)

## 2021-09-29 MED ORDER — ACETAMINOPHEN 325 MG PO TABS
650.0000 mg | ORAL_TABLET | Freq: Four times a day (QID) | ORAL | Status: DC | PRN
  Administered 2021-09-30 – 2021-10-07 (×5): 650 mg via ORAL
  Filled 2021-09-29 (×6): qty 2

## 2021-09-29 MED ORDER — SODIUM CHLORIDE 0.9 % IV SOLN: INTRAVENOUS | Status: DC
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
## 2021-09-29 NOTE — Progress Notes (Signed)
Inpatient Rehab Admissions Coordinator:  ? ?I do not have insurance auth or a CIR bed for this Pt. Today. I will follow for potential admit pending insurance auth and bed availability. ? ?Clemens Catholic, MS, CCC-SLP ?Rehab Admissions Coordinator  ?[phone] (celll) ?[phone] (office) ? ?
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
## 2021-09-29 NOTE — Care Management Important Message (Signed)
Important Message ? ?Patient Details  ?Name: Austin Conner ?MRN: [id number] ?Date of Birth: 1956-06-06 ? ? ?Medicare Important Message Given:  N/A - LOS <3 / Initial given by admissions ? ? ? ? ?Juliann Pulse A Curtistine Pettitt ?09/29/2021, 10:49 AM ?

## 2021-09-29 NOTE — TOC Progression Note (Signed)
Transition of Care (TOC) - Progression Note  ? ? ?Patient Details  ?Name: Austin Conner ?MRN: [id number] ?Date of Birth: Oct 21, 1955 ? ?Transition of Care (TOC) CM/SW Contact  ?Conception Oms, RN ?Phone Number: ?09/29/2021, 9:51 AM ? ?Clinical Narrative:   CIR is evaluating and attempting to get ins approval to go to CIR, TOC to follow for needs and asssit with DC planning ? ? ? ?Expected Discharge Plan: [place] ?Barriers to Discharge: Continued Medical Work up ? ?Expected Discharge Plan and Services ?Expected Discharge Plan: [place] ?  ?Discharge Planning Services: CM Consult ?  ?Living arrangements for the past 2 months: [place] ?                ?DME Arranged: N/A ?  ?  ?  ?  ?  ?  ?  ?  ?  ? ? ?Social Determinants of Health (SDOH) Interventions ?  ? ?Readmission Risk Interventions ?No flowsheet data found. ? ?

## 2021-09-30 DIAGNOSIS — R531 Weakness: Secondary | ICD-10-CM | POA: Diagnosis not present

## 2021-09-30 DIAGNOSIS — R5381 Other malaise: Secondary | ICD-10-CM | POA: Diagnosis not present

## 2021-09-30 DIAGNOSIS — R1310 Dysphagia, unspecified: Secondary | ICD-10-CM | POA: Diagnosis not present

## 2021-09-30 DIAGNOSIS — R052 Subacute cough: Secondary | ICD-10-CM | POA: Diagnosis not present

## 2021-09-30 LAB — URINALYSIS, ROUTINE W REFLEX MICROSCOPIC
Bilirubin Urine: NEGATIVE
Glucose, UA: NEGATIVE mg/dL
Hgb urine dipstick: NEGATIVE
Ketones, ur: 20 mg/dL — AB
Leukocytes,Ua: NEGATIVE
Nitrite: NEGATIVE
Protein, ur: 100 mg/dL — AB
Specific Gravity, Urine: 1.033 — ABNORMAL HIGH (ref 1.005–1.030)
Squamous Epithelial / HPF: NONE SEEN (ref 0–5)
pH: 5 (ref 5.0–8.0)

## 2021-09-30 MED ORDER — SODIUM CHLORIDE 0.9 % IV SOLN
1.0000 g | INTRAVENOUS | Status: DC
  Administered 2021-09-30 – 2021-10-01 (×2): 1 g via INTRAVENOUS
  Filled 2021-09-30: qty 1
  Filled 2021-09-30 (×2): qty 10
  Filled 2021-09-30: qty 1

## 2021-09-30 NOTE — Progress Notes (Signed)
Inpatient Rehab Admissions Coordinator:  ? ?I do not have insurance auth yet for CIR and cannot offer a bed this AM. As of yesterday PM, case was still under medical review. I will reach out to [facility] again today.  ? ?Clemens Catholic, MS, CCC-SLP ?Rehab Admissions Coordinator  ?[phone] (celll) ?[phone] (office) ? ?

## 2021-09-30 NOTE — TOC Progression Note (Signed)
Transition of Care (TOC) - Progression Note  ? ? ?Patient Details  ?Name: Austin Conner ?MRN: [id number] ?Date of Birth: 05/21/1956 ? ?Transition of Care (TOC) CM/SW Contact  ?Conception Oms, RN ?Phone Number: ?09/30/2021, 9:11 AM ? ?Clinical Narrative:    ?I met with the patient and his sister in the room to discuss DC plan, [name] (the patient's sister) is his caregiver at home, she stated that he generally is up with his walker daily, he has deconditioned and she feels that he would greatly benefit from CIR, I explained ins would have to approve it to go to CIR and that CIR would have to have a bed, She stated that she spoke with someone from CIR over the weekend and was told that they would try to get Approval from ins, She stated that if it is denied that she would appeal the decision. I explained I would reach out for a status update but it could take a few days to hear from ins.  I reached out thru secure chat to Mickel Baas with CIR to inquire about the status, awaiting a response ? ? ?Expected Discharge Plan: [place] ?Barriers to Discharge: Continued Medical Work up ? ?Expected Discharge Plan and Services ?Expected Discharge Plan: [place] ?  ?Discharge Planning Services: CM Consult ?  ?Living arrangements for the past 2 months: [place] ?                ?DME Arranged: N/A ?  ?  ?  ?  ?  ?  ?  ?  ?  ? ? ?Social Determinants of Health (SDOH) Interventions ?  ? ?Readmission Risk Interventions ?No flowsheet data found. ? ?
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
## 2021-09-30 NOTE — Care Management Important Message (Signed)
Important Message ? ?Patient Details  ?Name: Austin Conner ?MRN: [id number] ?Date of Birth: 22-May-1956 ? ? ?Medicare Important Message Given:  Yes ? ? ? ? ?[name] ?09/30/2021, 12:49 PM ?
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
## 2021-10-01 ENCOUNTER — Telehealth: Payer: Self-pay

## 2021-10-01 DIAGNOSIS — R531 Weakness: Secondary | ICD-10-CM | POA: Diagnosis not present

## 2021-10-01 DIAGNOSIS — R052 Subacute cough: Secondary | ICD-10-CM | POA: Diagnosis not present

## 2021-10-01 DIAGNOSIS — R55 Syncope and collapse: Secondary | ICD-10-CM | POA: Diagnosis not present

## 2021-10-01 DIAGNOSIS — R1310 Dysphagia, unspecified: Secondary | ICD-10-CM | POA: Diagnosis not present

## 2021-10-01 LAB — CBC
HCT: 40.4 % (ref 39.0–52.0)
Hemoglobin: 13.4 g/dL (ref 13.0–17.0)
MCH: 29.4 pg (ref 26.0–34.0)
MCHC: 33.2 g/dL (ref 30.0–36.0)
MCV: 88.6 fL (ref 80.0–100.0)
Platelets: 232 10*3/uL (ref 150–400)
RBC: 4.56 MIL/uL (ref 4.22–5.81)
RDW: 13.2 % (ref 11.5–15.5)
WBC: 15.2 10*3/uL — ABNORMAL HIGH (ref 4.0–10.5)
nRBC: 0 % (ref 0.0–0.2)

## 2021-10-01 LAB — BASIC METABOLIC PANEL
Anion gap: 12 (ref 5–15)
BUN: 18 mg/dL (ref 8–23)
CO2: 25 mmol/L (ref 22–32)
Calcium: 8.5 mg/dL — ABNORMAL LOW (ref 8.9–10.3)
Chloride: 102 mmol/L (ref 98–111)
Creatinine, Ser: 0.63 mg/dL (ref 0.61–1.24)
GFR, Estimated: >60 mL/min (ref >60)
Glucose, Bld: 137 mg/dL — ABNORMAL HIGH (ref 70–99)
Potassium: 3.8 mmol/L (ref 3.5–5.1)
Sodium: 139 mmol/L (ref 135–145)

## 2021-10-01 NOTE — Plan of Care (Signed)
Patient sleeping between care. Denies pain. PRN tylenol given for fever. Cloudy, amber urine noted.  ? ? ? ?Problem: Education: ?Goal: Knowledge of General Education information will improve ?Description: Including pain rating scale, medication(s)/side effects and non-pharmacologic comfort measures ?Outcome: Progressing ?  ?Problem: Health Behavior/Discharge Planning: ?Goal: Ability to manage health-related needs will improve ?Outcome: Progressing ?  ?Problem: Clinical Measurements: ?Goal: Ability to maintain clinical measurements within normal limits will improve ?Outcome: Progressing ?Goal: Will remain free from infection ?Outcome: Progressing ?Goal: Diagnostic test results will improve ?Outcome: Progressing ?Goal: Respiratory complications will improve ?Outcome: Progressing ?Goal: Cardiovascular complication will be avoided ?Outcome: Progressing ?  ?Problem: Activity: ?Goal: Risk for activity intolerance will decrease ?Outcome: Progressing ?  ?Problem: Nutrition: ?Goal: Adequate nutrition will be maintained ?Outcome: Progressing ?  ?Problem: Coping: ?Goal: Level of anxiety will decrease ?Outcome: Progressing ?  ?Problem: Elimination: ?Goal: Will not experience complications related to bowel motility ?Outcome: Progressing ?Goal: Will not experience complications related to urinary retention ?Outcome: Progressing ?  ?Problem: Pain Managment: ?Goal: General experience of comfort will improve ?Outcome: Progressing ?  ?Problem: Safety: ?Goal: Ability to remain free from injury will improve ?Outcome: Progressing ?  ?Problem: Skin Integrity: ?Goal: Risk for impaired skin integrity will decrease ?Outcome: Progressing ?  ?

## 2021-10-01 NOTE — Progress Notes (Signed)
Inpatient Rehab Admissions Coordinator:  ? ? I continue to await insurance decision for CIR. Physician notified of request for peer to peer today. I will continue to follow for potential admit pending insurance auth and bed availability.  ? ?Clemens Catholic, MS, CCC-SLP ?Rehab Admissions Coordinator  ?[phone] (celll) ?[phone] (office) ? ?
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
## 2021-10-01 NOTE — TOC Progression Note (Signed)
Transition of Care (TOC) - Progression Note  ? ? ?Patient Details  ?Name: Austin Conner ?MRN: [id number] ?Date of Birth: [birth year] ? ?Transition of Care (TOC) CM/SW Contact  ?Conception Oms, RN ?Phone Number: ?10/01/2021, 4:49 PM ? ?Clinical Narrative:   Met with the patient and his sister in the room, I explained the Denial for the CIR, They asked for [place] to do a bedsearch, They want to stay in [place] ?I did the FL2, Obtained PASSR and sent the bedsearch ? ? ? ?Expected Discharge Plan: [place] ?Barriers to Discharge: Continued Medical Work up ? ?Expected Discharge Plan and Services ?Expected Discharge Plan: [place] ?  ?Discharge Planning Services: CM Consult ?  ?Living arrangements for the past 2 months: [place] ?                ?DME Arranged: N/A ?  ?  ?  ?  ?  ?  ?  ?  ?  ? ? ?Social Determinants of Health (SDOH) Interventions ?  ? ?Readmission Risk Interventions ?No flowsheet data found. ? ?
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
## 2021-10-01 NOTE — Progress Notes (Signed)
Inpatient Rehab Admissions Coordinator:  ? ?Insurance has denied CIR. Pt.'s tolerance with therapy is also reduced today and I no longer think he could tolerate the intensity of CIR. I am not filing an appeal on the behalf of the patient; however, Pt.'s sister states that she intends to appeal the decision on her own. I provided her with the phone number for [facility] Expedited appeals but encouraged her to consider short term rehab at SNF, as expedited appeals with this payor rarely result in an overturn.  ? ?Clemens Catholic, MS, CCC-SLP ?Rehab Admissions Coordinator  ?[phone] (celll) ?[phone] (office) ? ?

## 2021-10-01 NOTE — Progress Notes (Signed)
Orthopedic Tech Progress Note ?Patient Details:  ?Austin Conner ?[birth year] ?[id number] ? ?Patient ID: [name], male   DOB: 1955-11-10, 66 y.o.   MRN: [id number] ? ?Charline Bills Ruchy Wildrick ?10/01/2021, 8:41 AM ?Placed order with [facility] ?
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
## 2021-10-01 NOTE — Telephone Encounter (Signed)
Author reached out to pt's sister this date to relay activity with rehab over the past 3 days (including this date). Author explain decline in strength and alertness since initial evaluation. Author explained daily engagement of placing bed in recliner position for pulmonary toilet and core strength. Sister acknowledged message, but did have to end conversation short to take an incoming call from neurology.  ? ?2:18 PM, 10/01/21 ?Etta Grandchild, PT, DPT ?Physical Therapist - [place] ?[address]  ?[phone] (ASCOM)   ?

## 2021-10-01 NOTE — [place] FL2 (Signed)
?[place] MEDICAID FL2 LEVEL OF CARE SCREENING TOOL  ?  ? ?IDENTIFICATION  ?Patient Name: ?Austin Conner Birthdate: [birth year] Sex: male Admission Date (Current Location): ?09/25/2021  ?[place] and [place] Number: ? [place] ?  Facility and Address:  ?[facility], [address] [place], [place] [address] ?     Provider Number: ?[id number]  ?Attending Physician Name and Address:  ?Ezekiel Slocumb, DO ? Relative Name and Phone Number:  ?[name] [phone] ?   ?Current Level of Care: ?Hospital Recommended Level of Care: ?[place] Prior Approval Number: ?  ? ?Date Approved/Denied: ?  PASRR Number: ?[id number] ? ?Discharge Plan: ?SNF ?  ? ?Current Diagnoses: ?Patient Active Problem List  ? Diagnosis Date Noted  ? Shortness of breath   ? Weakness   ? Obesity (BMI 30-39.9) 09/26/2021  ? Debility 09/26/2021  ? Cough 09/26/2021  ? Dysphagia 09/26/2021  ? Essential hypertension 09/26/2021  ? Hypokalemia 09/26/2021  ? Syncope 09/25/2021  ? ? ?Orientation RESPIRATION BLADDER Height & Weight   ?  ?Self, Time, Situation, Place ? Normal Continent Weight: 133.6 kg ?Height:  '6\' 4"'$  (193 cm)  ?BEHAVIORAL SYMPTOMS/MOOD NEUROLOGICAL BOWEL NUTRITION STATUS  ?    Continent Diet (see dc summary)  ?AMBULATORY STATUS COMMUNICATION OF NEEDS Skin   ?Extensive Assist Verbally Normal ?  ?  ?  ?    ?     ?     ? ? ?Personal Care Assistance Level of Assistance  ?Bathing, Feeding, Dressing Bathing Assistance: Maximum assistance ?Feeding assistance: Limited assistance ?Dressing Assistance: Maximum assistance ?   ? ?Functional Limitations Info  ?    ?  ?   ? ? ?SPECIAL CARE FACTORS FREQUENCY  ?PT (By licensed PT), OT (By licensed OT)   ?  ?PT Frequency: 5 times per week ?OT Frequency: 5 times per week ?  ?  ?  ?   ? ? ?Contractures Contractures Info: Not present  ? ? ?Additional Factors Info  ?Code Status, Allergies Code Status Info: Full code ?Allergies Info: Ciprofloxacin ?  ?  ?  ?   ? ?Current  Medications (10/01/2021):  This is the current hospital active medication list ?Current Facility-Administered Medications  ?Medication Dose Route Frequency Provider Last Rate Last Admin  ? acetaminophen (TYLENOL) tablet 650 mg  650 mg Oral Q6H PRN Richarda Osmond, MD   650 mg at 09/30/21 2358  ? albuterol (PROVENTIL) (2.5 MG/3ML) 0.083% nebulizer solution 2.5 mg  2.5 mg Nebulization Q6H PRN Richarda Osmond, MD      ? baclofen (LIORESAL) tablet 10 mg  10 mg Oral BID Richarda Osmond, MD   10 mg at 10/01/21 0941  ? benzonatate (TESSALON) capsule 200 mg  200 mg Oral TID PRN Shelly Coss, MD      ? cefTRIAXone (ROCEPHIN) 1 g in sodium chloride 0.9 % 100 mL IVPB  1 g Intravenous Q24H Doristine Mango L, MD 200 mL/hr at 10/01/21 1115 1 g at 10/01/21 1115  ? dantrolene (DANTRIUM) capsule 100 mg  100 mg Oral BID Imagene Sheller S, DO   100 mg at 10/01/21 1111  ? DULoxetine (CYMBALTA) DR capsule 60 mg  60 mg Oral Daily Imagene Sheller S, DO   60 mg at 09/30/21 2121  ? enoxaparin (LOVENOX) injection 67.5 mg  0.5 mg/kg Subcutaneous Q24H Imagene Sheller S, DO   67.5 mg at 09/30/21 2124  ? guaiFENesin (MUCINEX) 12 hr tablet 1,200 mg  1,200 mg Oral BID Adhikari, Amrit,  MD   1,200 mg at 10/01/21 0941  ? losartan (COZAAR) tablet 100 mg  100 mg Oral Daily Richarda Osmond, MD   100 mg at 10/01/21 0941  ? metoprolol succinate (TOPROL-XL) 24 hr tablet 25 mg  25 mg Oral BID Richarda Osmond, MD   25 mg at 10/01/21 0941  ? multivitamin with minerals tablet 1 tablet  1 tablet Oral Daily Imagene Sheller S, DO   1 tablet at 10/01/21 0941  ? pantoprazole (PROTONIX) EC tablet 40 mg  40 mg Oral Daily Imagene Sheller S, DO   40 mg at 10/01/21 0941  ? polyethylene glycol (MIRALAX / GLYCOLAX) packet 17 g  17 g Oral QHS Imagene Sheller S, DO   17 g at 09/30/21 2121  ? tiZANidine (ZANAFLEX) tablet 6 mg  6 mg Oral TID Imagene Sheller S, DO   6 mg at 10/01/21 1526  ? ? ? ?Discharge Medications: ?Please see discharge summary for a list of  discharge medications. ? ?Relevant Imaging Results: ? ?Relevant Lab Results: ? ? ?Additional Information ?SS# [id number] ? ?Conception Oms, RN ? ? ? ? ?

## 2021-10-02 ENCOUNTER — Inpatient Hospital Stay: Payer: Medicare HMO

## 2021-10-02 DIAGNOSIS — R55 Syncope and collapse: Secondary | ICD-10-CM | POA: Diagnosis not present

## 2021-10-02 DIAGNOSIS — J690 Pneumonitis due to inhalation of food and vomit: Secondary | ICD-10-CM | POA: Diagnosis not present

## 2021-10-02 LAB — PROCALCITONIN: Procalcitonin: 0.45 ng/mL

## 2021-10-02 LAB — BASIC METABOLIC PANEL
Anion gap: 13 (ref 5–15)
BUN: 21 mg/dL (ref 8–23)
CO2: 26 mmol/L (ref 22–32)
Calcium: 8.6 mg/dL — ABNORMAL LOW (ref 8.9–10.3)
Chloride: 102 mmol/L (ref 98–111)
Creatinine, Ser: 0.7 mg/dL (ref 0.61–1.24)
GFR, Estimated: >60 mL/min (ref >60)
Glucose, Bld: 122 mg/dL — ABNORMAL HIGH (ref 70–99)
Potassium: 4 mmol/L (ref 3.5–5.1)
Sodium: 141 mmol/L (ref 135–145)

## 2021-10-02 LAB — CBC
HCT: 40.6 % (ref 39.0–52.0)
Hemoglobin: 13.1 g/dL (ref 13.0–17.0)
MCH: 28.7 pg (ref 26.0–34.0)
MCHC: 32.3 g/dL (ref 30.0–36.0)
MCV: 89 fL (ref 80.0–100.0)
Platelets: 270 10*3/uL (ref 150–400)
RBC: 4.56 MIL/uL (ref 4.22–5.81)
RDW: 13.2 % (ref 11.5–15.5)
WBC: 15.3 10*3/uL — ABNORMAL HIGH (ref 4.0–10.5)
nRBC: 0 % (ref 0.0–0.2)

## 2021-10-02 LAB — LACTIC ACID, PLASMA: Lactic Acid, Venous: 0.8 mmol/L (ref 0.5–1.9)

## 2021-10-02 LAB — MAGNESIUM: Magnesium: 2.3 mg/dL (ref 1.7–2.4)

## 2021-10-02 MED ORDER — SODIUM CHLORIDE 0.9 % IV SOLN
3.0000 g | Freq: Four times a day (QID) | INTRAVENOUS | Status: AC
  Administered 2021-10-02 – 2021-10-04 (×11): 3 g via INTRAVENOUS
  Filled 2021-10-02 (×12): qty 8

## 2021-10-02 MED ORDER — LACTATED RINGERS IV SOLN: INTRAVENOUS | Status: DC

## 2021-10-02 MED ORDER — BACLOFEN 10 MG PO TABS
5.0000 mg | ORAL_TABLET | Freq: Two times a day (BID) | ORAL | Status: DC
  Administered 2021-10-02 – 2021-10-04 (×4): 5 mg via ORAL
  Filled 2021-10-02 (×4): qty 1

## 2021-10-02 MED ORDER — PANTOPRAZOLE SODIUM 40 MG PO TBEC
40.0000 mg | DELAYED_RELEASE_TABLET | Freq: Two times a day (BID) | ORAL | Status: DC
  Administered 2021-10-02 – 2021-10-07 (×10): 40 mg via ORAL
  Filled 2021-10-02 (×10): qty 1

## 2021-10-02 NOTE — Assessment & Plan Note (Addendum)
PT and OT recommending SNF. ?TOC following for placement. ?Fall precautions. ?
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
## 2021-10-02 NOTE — Progress Notes (Signed)
Dr. Nicole Kindred notified about small pustules on inner thighs per sister's request. ?

## 2021-10-02 NOTE — Progress Notes (Signed)
PT Cancellation Note ? ?Patient Details ?Name: Austin Conner ?MRN: [id number] ?DOB: 08-16-1955 ? ? ?Cancelled Treatment:     PT attempt. Pt currently on bedpan having BM. Author will return in morning to progress to EOB/OOB activity.  ? ? ?Willette Pa ?10/02/2021, 4:15 PM ?

## 2021-10-02 NOTE — TOC Progression Note (Signed)
Transition of Care (TOC) - Progression Note  ? ? ?Patient Details  ?Name: Austin Conner ?MRN: [id number] ?Date of Birth: 07-13-1956 ? ?Transition of Care (TOC) CM/SW Contact  ?Conception Oms, RN ?Phone Number: ?10/02/2021, 1:23 PM ? ?Clinical Narrative:    ? ?Spoke with [name] the patient's sister, I reviewed that the only bed offer we have currently is [facility], She asked about [facility], I explained [facility] does not take patient's from the community and she asked about [facility], I reached out to [facility] and requested them to review the patient, I explained at this time the only offer is from [facility], I am awaiting to hear back from [facility] ? ?Expected Discharge Plan: [place] ?Barriers to Discharge: Continued Medical Work up ? ?Expected Discharge Plan and Services ?Expected Discharge Plan: [place] ?  ?Discharge Planning Services: CM Consult ?  ?Living arrangements for the past 2 months: [place] ?                ?DME Arranged: N/A ?  ?  ?  ?  ?  ?  ?  ?  ?  ? ? ?Social Determinants of Health (SDOH) Interventions ?  ? ?Readmission Risk Interventions ?No flowsheet data found. ? ?

## 2021-10-02 NOTE — Progress Notes (Signed)
While making my morning rounds to medicate my patient for a temp of 100.63F I noticed that the patient was mumbling his words. As I started mouth care I noticed that the patient's mouth was full of saliva and a mucus plug towards that back of the patient's throat. I suctioned the patient's mouth and instructed him to cough. After hearing a gargling sound I noticed that the patient had more mucus in the back of the throat, which was also suctioned. I patient states that he feels worst than normal. MD paged about possible aspiration. I was instructed to keep the patient NPO as he orders a chest xray. Will continue to monitor.  ?
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
## 2021-10-02 NOTE — TOC Progression Note (Signed)
Transition of Care (TOC) - Progression Note  ? ? ?Patient Details  ?Name: Austin Conner ?MRN: [id number] ?Date of Birth: 16-Feb-1956 ? ?Transition of Care (TOC) CM/SW Contact  ?Conception Oms, RN ?Phone Number: ?10/02/2021, 9:30 AM ? ?Clinical Narrative:   Patient not medically ready for DC, No bed offers at this time, Will continue to look for a STR bed, TOC to continue to follow and assist with DC planning and needs ? ? ? ?Expected Discharge Plan: [place] ?Barriers to Discharge: Continued Medical Work up ? ?Expected Discharge Plan and Services ?Expected Discharge Plan: [place] ?  ?Discharge Planning Services: CM Consult ?  ?Living arrangements for the past 2 months: [place] ?                ?DME Arranged: N/A ?  ?  ?  ?  ?  ?  ?  ?  ?  ? ? ?Social Determinants of Health (SDOH) Interventions ?  ? ?Readmission Risk Interventions ?No flowsheet data found. ? ?

## 2021-10-02 NOTE — Progress Notes (Signed)
Pharmacy Antibiotic Note ? ?Austin Conner is a 66 y.o. male admitted on 09/25/2021 with  aspiration pneumonie .  Pharmacy has been consulted for Unasyn dosing. ? ?Plan: ?Unasyn 3g IV q6h ? ?Height: '6\' 4"'$  (193 cm) ?Weight: 133.6 kg (294 lb 8 oz) ?IBW/kg (Calculated) : 86.8 ? ?Temp (24hrs), Avg:99.5 ?F (37.5 ?C), Min:98.9 ?F (37.2 ?C), Max:100.3 ?F (37.9 ?C) ? ?Recent Labs  ?Lab 09/26/21 ?1694 09/27/21 ?5038 09/29/21 ?8828 09/29/21 ?1807 10/01/21 ?0355 10/02/21 ?0440  ?WBC 8.8  --   --  13.2* 15.2* 15.3*  ?CREATININE 0.63 0.53* 0.63  --  0.63 0.70  ?LATICACIDVEN 0.8  --   --   --   --   --   ?  ?Estimated Creatinine Clearance: 137.4 mL/min (by C-G formula based on SCr of 0.7 mg/dL).   ? ?Allergies  ?Allergen Reactions  ? Ciprofloxacin Hives  ?  Other reaction(s): neurological reaction  ? ? ?Antimicrobials this admission: ?Ceftriaxone 3/7 >> 3/8 ?Unasyn 3/9 >>  ? ?Dose adjustments this admission: ? ?Microbiology results: ? ? ?Thank you for allowing pharmacy to be a part of this patient?s care. ? ?Paulina Fusi, PharmD, BCPS ?10/02/2021 ?9:39 AM ? ? ?

## 2021-10-02 NOTE — Assessment & Plan Note (Addendum)
Likely due to aspiration pneumonia.  Management as outlined.   ?Clinically improved ?

## 2021-10-03 DIAGNOSIS — J690 Pneumonitis due to inhalation of food and vomit: Secondary | ICD-10-CM | POA: Diagnosis not present

## 2021-10-03 DIAGNOSIS — R531 Weakness: Secondary | ICD-10-CM | POA: Diagnosis not present

## 2021-10-03 DIAGNOSIS — R1310 Dysphagia, unspecified: Secondary | ICD-10-CM | POA: Diagnosis not present

## 2021-10-03 LAB — MRSA NEXT GEN BY PCR, NASAL: MRSA by PCR Next Gen: NOT DETECTED

## 2021-10-03 LAB — BASIC METABOLIC PANEL
Anion gap: 8 (ref 5–15)
BUN: 23 mg/dL (ref 8–23)
CO2: 28 mmol/L (ref 22–32)
Calcium: 8.3 mg/dL — ABNORMAL LOW (ref 8.9–10.3)
Chloride: 107 mmol/L (ref 98–111)
Creatinine, Ser: 0.65 mg/dL (ref 0.61–1.24)
GFR, Estimated: >60 mL/min (ref >60)
Glucose, Bld: 129 mg/dL — ABNORMAL HIGH (ref 70–99)
Potassium: 3.8 mmol/L (ref 3.5–5.1)
Sodium: 143 mmol/L (ref 135–145)

## 2021-10-03 LAB — CBC
HCT: 37.4 % — ABNORMAL LOW (ref 39.0–52.0)
Hemoglobin: 12.3 g/dL — ABNORMAL LOW (ref 13.0–17.0)
MCH: 29.2 pg (ref 26.0–34.0)
MCHC: 32.9 g/dL (ref 30.0–36.0)
MCV: 88.8 fL (ref 80.0–100.0)
Platelets: 269 10*3/uL (ref 150–400)
RBC: 4.21 MIL/uL — ABNORMAL LOW (ref 4.22–5.81)
RDW: 13.2 % (ref 11.5–15.5)
WBC: 13.5 10*3/uL — ABNORMAL HIGH (ref 4.0–10.5)
nRBC: 0 % (ref 0.0–0.2)

## 2021-10-03 LAB — PROCALCITONIN: Procalcitonin: 0.43 ng/mL

## 2021-10-03 MED ORDER — ORAL CARE MOUTH RINSE
15.0000 mL | Freq: Two times a day (BID) | OROMUCOSAL | Status: DC
  Administered 2021-10-03 – 2021-10-07 (×8): 15 mL via OROMUCOSAL

## 2021-10-03 MED ORDER — CARBAMIDE PEROXIDE 6.5 % OT SOLN
5.0000 [drp] | Freq: Two times a day (BID) | OTIC | Status: DC
  Administered 2021-10-03 – 2021-10-07 (×9): 5 [drp] via OTIC
  Filled 2021-10-03: qty 15

## 2021-10-03 NOTE — TOC Progression Note (Signed)
Transition of Care (TOC) - Progression Note  ? ? ?Patient Details  ?Name: Austin Conner ?MRN: [id number] ?Date of Birth: 02-Jan-1956 ? ?Transition of Care (TOC) CM/SW Contact  ?Conception Oms, RN ?Phone Number: ?10/03/2021, 10:45 AM ? ?Clinical Narrative:   Spoke with [name] the patient's sister She asked that I send the bed search and request to [facility] I sent the request to [facility],, She requested that he be set up with Hospice when he goes home, I explained that Hospice would need to be set up by the SNF at DC she asked that I sent the request to all of the facilities in [place], I sent out the request ? ? ? ?Expected Discharge Plan: [place] ?Barriers to Discharge: Continued Medical Work up ? ?Expected Discharge Plan and Services ?Expected Discharge Plan: [place] ?  ?Discharge Planning Services: CM Consult ?  ?Living arrangements for the past 2 months: [place] ?                ?DME Arranged: N/A ?  ?  ?  ?  ?  ?  ?  ?  ?  ? ? ?Social Determinants of Health (SDOH) Interventions ?  ? ?Readmission Risk Interventions ?No flowsheet data found. ? ?

## 2021-10-03 NOTE — Progress Notes (Signed)
SLP Cancellation Note ? ?Patient Details ?Name: Austin Conner ?MRN: [id number] ?DOB: 21-Oct-1955 ? ? ?Cancelled treatment:       Reason Eval/Treat Not Completed:  (chart reviewed; consulted MD re: status today) ?MD stated pt was improved today w/ less phlegm reported; she has ordered his PPI 2x daily for coverage and support. ST services can be available if any new needs while admitted. MD agreed. Precautions posted in room.  ? ? ? ? ?Orinda Kenner, MS, CCC-SLP ?Speech Language Pathologist ?Rehab Services; [place] ?[phone] (ascom) ?[name] ?10/03/2021, 4:51 PM ?

## 2021-10-03 NOTE — TOC Progression Note (Signed)
Transition of Care (TOC) - Progression Note  ? ? ?Patient Details  ?Name: Austin Conner ?MRN: [id number] ?Date of Birth: [birth year] ? ?Transition of Care (TOC) CM/SW Contact  ?Conception Oms, RN ?Phone Number: ?10/03/2021, 2:47 PM ? ?Clinical Narrative:    ?Spoke to the patient's sister [name] on the phone, reviewed the bed offers, she chose [facility] in [place], I notified [facility], I requested for them to start Ins approval process ? ? ?Expected Discharge Plan: [place] ?Barriers to Discharge: Continued Medical Work up ? ?Expected Discharge Plan and Services ?Expected Discharge Plan: [place] ?  ?Discharge Planning Services: CM Consult ?  ?Living arrangements for the past 2 months: [place] ?                ?DME Arranged: N/A ?  ?  ?  ?  ?  ?  ?  ?  ?  ? ? ?Social Determinants of Health (SDOH) Interventions ?  ? ?Readmission Risk Interventions ?No flowsheet data found. ? ?

## 2021-10-03 NOTE — Progress Notes (Signed)
[name] [name] took home dirty clothes but left a pair of clean socks. ?

## 2021-10-03 NOTE — Plan of Care (Signed)
Patient is calm and cooperative. Suctioned him several times due to secretions being stuck in his throat. Lung sounds are diminished but clear on auscultation. No edema found to legs and pulses are +2-3 (can be felt through socks). Denied pain. Speech garbled. Keep HOB high when feeding due to aspiration risk.  ?Problem: Education: ?Goal: Knowledge of General Education information will improve ?Description: Including pain rating scale, medication(s)/side effects and non-pharmacologic comfort measures ?Outcome: Progressing ?  ?Problem: Health Behavior/Discharge Planning: ?Goal: Ability to manage health-related needs will improve ?Outcome: Progressing ?  ?Problem: Clinical Measurements: ?Goal: Ability to maintain clinical measurements within normal limits will improve ?Outcome: Progressing ?Goal: Will remain free from infection ?Outcome: Progressing ?Goal: Diagnostic test results will improve ?Outcome: Progressing ?Goal: Cardiovascular complication will be avoided ?Outcome: Progressing ?  ?Problem: Activity: ?Goal: Risk for activity intolerance will decrease ?Outcome: Progressing ?  ?Problem: Nutrition: ?Goal: Adequate nutrition will be maintained ?Outcome: Progressing ?  ?Problem: Coping: ?Goal: Level of anxiety will decrease ?Outcome: Progressing ?  ?Problem: Elimination: ?Goal: Will not experience complications related to bowel motility ?Outcome: Progressing ?Goal: Will not experience complications related to urinary retention ?Outcome: Progressing ?  ?Problem: Pain Managment: ?Goal: General experience of comfort will improve ?Outcome: Progressing ?  ?Problem: Safety: ?Goal: Ability to remain free from injury will improve ?Outcome: Progressing ?  ?Problem: Skin Integrity: ?Goal: Risk for impaired skin integrity will decrease ?Outcome: Progressing ?  ?
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
## 2021-10-03 NOTE — Care Management Important Message (Signed)
Important Message ? ?Patient Details  ?Name: Austin Conner ?MRN: [id number] ?Date of Birth: [birth year] ? ? ?Medicare Important Message Given:  Yes ? ? ? ? ?Juliann Pulse A Kieth Hartis ?10/03/2021, 12:28 PM ?

## 2021-10-03 NOTE — TOC Progression Note (Signed)
Transition of Care (TOC) - Progression Note  ? ? ?Patient Details  ?Name: Austin Conner ?MRN: [id number] ?Date of Birth: 01-04-1956 ? ?Transition of Care (TOC) CM/SW Contact  ?Conception Oms, RN ?Phone Number: ?10/03/2021, 10:13 AM ? ?Clinical Narrative:   Reached out to jusith thepatient's sister and left a general VM, [facility] declined offering a bed, At this time the only offer is [facility] ? ? ? ?Expected Discharge Plan: [place] ?Barriers to Discharge: Continued Medical Work up ? ?Expected Discharge Plan and Services ?Expected Discharge Plan: [place] ?  ?Discharge Planning Services: CM Consult ?  ?Living arrangements for the past 2 months: [place] ?                ?DME Arranged: N/A ?  ?  ?  ?  ?  ?  ?  ?  ?  ? ? ?Social Determinants of Health (SDOH) Interventions ?  ? ?Readmission Risk Interventions ?No flowsheet data found. ? ?
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
## 2021-10-04 LAB — CBC
HCT: 35.6 % — ABNORMAL LOW (ref 39.0–52.0)
Hemoglobin: 11.8 g/dL — ABNORMAL LOW (ref 13.0–17.0)
MCH: 29.2 pg (ref 26.0–34.0)
MCHC: 33.1 g/dL (ref 30.0–36.0)
MCV: 88.1 fL (ref 80.0–100.0)
Platelets: 312 10*3/uL (ref 150–400)
RBC: 4.04 MIL/uL — ABNORMAL LOW (ref 4.22–5.81)
RDW: 13.2 % (ref 11.5–15.5)
WBC: 14.4 10*3/uL — ABNORMAL HIGH (ref 4.0–10.5)
nRBC: 0 % (ref 0.0–0.2)

## 2021-10-04 LAB — PROCALCITONIN: Procalcitonin: 0.35 ng/mL

## 2021-10-04 MED ORDER — AMOXICILLIN-POT CLAVULANATE 400-57 MG/5ML PO SUSR
800.0000 mg | Freq: Two times a day (BID) | ORAL | Status: AC
Start: 2021-10-05 — End: 2021-10-06
  Administered 2021-10-05 – 2021-10-06 (×4): 800 mg via ORAL
  Filled 2021-10-04 (×5): qty 10

## 2021-10-04 MED ORDER — ENOXAPARIN SODIUM 80 MG/0.8ML IJ SOSY
0.5000 mg/kg | PREFILLED_SYRINGE | INTRAMUSCULAR | Status: DC
  Administered 2021-10-04 – 2021-10-05 (×2): 62.5 mg via SUBCUTANEOUS
  Filled 2021-10-04 (×3): qty 0.63
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
## 2021-10-04 NOTE — Plan of Care (Signed)

## 2021-10-05 LAB — CBC WITH DIFFERENTIAL/PLATELET
Abs Immature Granulocytes: 0.07 10*3/uL (ref 0.00–0.07)
Basophils Absolute: 0 10*3/uL (ref 0.0–0.1)
Basophils Relative: 0 %
Eosinophils Absolute: 0.1 10*3/uL (ref 0.0–0.5)
Eosinophils Relative: 1 %
HCT: 34.9 % — ABNORMAL LOW (ref 39.0–52.0)
Hemoglobin: 11.6 g/dL — ABNORMAL LOW (ref 13.0–17.0)
Immature Granulocytes: 1 %
Lymphocytes Relative: 11 %
Lymphs Abs: 1.4 10*3/uL (ref 0.7–4.0)
MCH: 29.3 pg (ref 26.0–34.0)
MCHC: 33.2 g/dL (ref 30.0–36.0)
MCV: 88.1 fL (ref 80.0–100.0)
Monocytes Absolute: 1.7 10*3/uL — ABNORMAL HIGH (ref 0.1–1.0)
Monocytes Relative: 14 %
Neutro Abs: 8.9 10*3/uL — ABNORMAL HIGH (ref 1.7–7.7)
Neutrophils Relative %: 73 %
Platelets: 332 10*3/uL (ref 150–400)
RBC: 3.96 MIL/uL — ABNORMAL LOW (ref 4.22–5.81)
RDW: 13.2 % (ref 11.5–15.5)
WBC: 12.3 10*3/uL — ABNORMAL HIGH (ref 4.0–10.5)
nRBC: 0 % (ref 0.0–0.2)

## 2021-10-05 MED ORDER — METOPROLOL SUCCINATE ER 50 MG PO TB24
50.0000 mg | ORAL_TABLET | Freq: Two times a day (BID) | ORAL | Status: DC
  Administered 2021-10-05 – 2021-10-07 (×4): 50 mg via ORAL
  Filled 2021-10-05 (×4): qty 1

## 2021-10-05 NOTE — TOC Progression Note (Signed)
Transition of Care (TOC) - Progression Note  ? ? ?Patient Details  ?Name: Austin Conner ?MRN: [id number] ?Date of Birth: 1956-06-29 ? ?Transition of Care (TOC) CM/SW Contact  ?Kamirah Shugrue, LCSW ?Phone Number: [phone] ?10/05/2021, 10:38 AM ? ?Clinical Narrative:    ? ?SNF placement accepted at [facility] in [place], pending update from [place] for insurance authorization. ? ?Expected Discharge Plan: [place] ?Barriers to Discharge: Continued Medical Work up ? ?Expected Discharge Plan and Services ?Expected Discharge Plan: [place] ?  ?Discharge Planning Services: CM Consult ?  ?Living arrangements for the past 2 months: [place] ?                ?DME Arranged: N/A ?  ?  ?  ?  ?  ?  ?  ?  ?  ? ? ?Social Determinants of Health (SDOH) Interventions ?  ? ?Readmission Risk Interventions ?No flowsheet data found. ? ?

## 2021-10-05 NOTE — Plan of Care (Signed)
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
## 2021-10-06 LAB — RESP PANEL BY RT-PCR (FLU A&B, COVID) ARPGX2
Influenza A by PCR: NEGATIVE
Influenza B by PCR: NEGATIVE
SARS Coronavirus 2 by RT PCR: NEGATIVE

## 2021-10-06 LAB — CBC
HCT: 36 % — ABNORMAL LOW (ref 39.0–52.0)
Hemoglobin: 11.9 g/dL — ABNORMAL LOW (ref 13.0–17.0)
MCH: 29 pg (ref 26.0–34.0)
MCHC: 33.1 g/dL (ref 30.0–36.0)
MCV: 87.8 fL (ref 80.0–100.0)
Platelets: 360 10*3/uL (ref 150–400)
RBC: 4.1 MIL/uL — ABNORMAL LOW (ref 4.22–5.81)
RDW: 13.1 % (ref 11.5–15.5)
WBC: 14.5 10*3/uL — ABNORMAL HIGH (ref 4.0–10.5)
nRBC: 0 % (ref 0.0–0.2)

## 2021-10-06 MED ORDER — ENOXAPARIN SODIUM 60 MG/0.6ML IJ SOSY
60.0000 mg | PREFILLED_SYRINGE | INTRAMUSCULAR | Status: DC
Start: 2021-10-06 — End: 2021-10-07
  Administered 2021-10-06: 60 mg via SUBCUTANEOUS
  Filled 2021-10-06: qty 0.6
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
## 2021-10-06 NOTE — Progress Notes (Signed)
?   10/06/21 1000  ?Clinical Encounter Type  ?Visited With Patient  ?Visit Type Initial  ? ?Introductory visit ?

## 2021-10-06 NOTE — Care Management Important Message (Signed)
Important Message ? ?Patient Details  ?Name: Austin Conner ?MRN: [id number] ?Date of Birth: [birth year] ? ? ?Medicare Important Message Given:  Yes ? ? ? ? ?Juliann Pulse A Alencia Gordon ?10/06/2021, 11:30 AM ?
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
## 2021-10-06 NOTE — TOC Progression Note (Signed)
Transition of Care (TOC) - Progression Note  ? ? ?Patient Details  ?Name: Austin Conner ?MRN: [id number] ?Date of Birth: 1955-08-22 ? ?Transition of Care (TOC) CM/SW Contact  ?Conception Oms, RN ?Phone Number: ?10/06/2021, 11:07 AM ? ?Clinical Narrative:   Reached out to [employer] to check on Auth, still pending ? ? ? ?Expected Discharge Plan: [place] ?Barriers to Discharge: Continued Medical Work up ? ?Expected Discharge Plan and Services ?Expected Discharge Plan: [place] ?  ?Discharge Planning Services: CM Consult ?  ?Living arrangements for the past 2 months: [place] ?                ?DME Arranged: N/A ?  ?  ?  ?  ?  ?  ?  ?  ?  ? ? ?Social Determinants of Health (SDOH) Interventions ?  ? ?Readmission Risk Interventions ?No flowsheet data found. ? ?

## 2021-10-06 NOTE — Progress Notes (Signed)
PT Cancellation Note ? ?Patient Details ?Name: Austin Conner ?MRN: [id number] ?DOB: 03/22/1956 ? ? ?Cancelled Treatment:    Reason Eval/Treat Not Completed: Fatigue/lethargy limiting ability to participate (Treatment attempted. Pt reports fatigued from bed mobility this morning for pericare post BM. Will try back again in afternoon timne permitting.) ? ? ?[name] ?10/06/2021, 11:23 AM ?

## 2021-10-06 NOTE — TOC Progression Note (Signed)
Transition of Care (TOC) - Progression Note  ? ? ?Patient Details  ?Name: Austin Conner ?MRN: [id number] ?Date of Birth: [birth year] ? ?Transition of Care (TOC) CM/SW Contact  ?Conception Oms, RN ?Phone Number: ?10/06/2021, 4:12 PM ? ?Clinical Narrative:   [employer] called and stated that [employer] wants to offer a peer to peer to go to [facility] [phone] option 3, before noon tomorrow, I notified the physician ? ? ? ?Expected Discharge Plan: [place] ?Barriers to Discharge: Continued Medical Work up ? ?Expected Discharge Plan and Services ?Expected Discharge Plan: [place] ?  ?Discharge Planning Services: CM Consult ?  ?Living arrangements for the past 2 months: [place] ?                ?DME Arranged: N/A ?  ?  ?  ?  ?  ?  ?  ?  ?  ? ? ?Social Determinants of Health (SDOH) Interventions ?  ? ?Readmission Risk Interventions ?No flowsheet data found. ? ?

## 2021-10-07 ENCOUNTER — Emergency Department (HOSPITAL_COMMUNITY): Payer: Medicare HMO

## 2021-10-07 ENCOUNTER — Inpatient Hospital Stay (HOSPITAL_COMMUNITY)
Admission: EM | Admit: 2021-10-07 | Discharge: 2021-10-18 | DRG: 871 | Disposition: A | Discharge status: Skilled Nursing Facility | Payer: Medicare HMO | Source: Skilled Nursing Facility | Attending: Internal Medicine | Admitting: Internal Medicine

## 2021-10-07 ENCOUNTER — Encounter (HOSPITAL_COMMUNITY): Payer: Self-pay | Admitting: Emergency Medicine

## 2021-10-07 DIAGNOSIS — J690 Pneumonitis due to inhalation of food and vomit: Secondary | ICD-10-CM | POA: Diagnosis not present

## 2021-10-07 DIAGNOSIS — R509 Fever, unspecified: Secondary | ICD-10-CM

## 2021-10-07 DIAGNOSIS — G8114 Spastic hemiplegia affecting left nondominant side: Secondary | ICD-10-CM | POA: Diagnosis present

## 2021-10-07 DIAGNOSIS — N400 Enlarged prostate without lower urinary tract symptoms: Secondary | ICD-10-CM | POA: Diagnosis present

## 2021-10-07 DIAGNOSIS — Z9049 Acquired absence of other specified parts of digestive tract: Secondary | ICD-10-CM

## 2021-10-07 DIAGNOSIS — E871 Hypo-osmolality and hyponatremia: Secondary | ICD-10-CM | POA: Diagnosis present

## 2021-10-07 DIAGNOSIS — G2581 Restless legs syndrome: Secondary | ICD-10-CM | POA: Diagnosis present

## 2021-10-07 DIAGNOSIS — H6123 Impacted cerumen, bilateral: Secondary | ICD-10-CM | POA: Diagnosis present

## 2021-10-07 DIAGNOSIS — Z20822 Contact with and (suspected) exposure to covid-19: Secondary | ICD-10-CM | POA: Diagnosis present

## 2021-10-07 DIAGNOSIS — R918 Other nonspecific abnormal finding of lung field: Secondary | ICD-10-CM

## 2021-10-07 DIAGNOSIS — Z9079 Acquired absence of other genital organ(s): Secondary | ICD-10-CM

## 2021-10-07 DIAGNOSIS — A419 Sepsis, unspecified organism: Principal | ICD-10-CM | POA: Diagnosis present

## 2021-10-07 DIAGNOSIS — E876 Hypokalemia: Secondary | ICD-10-CM | POA: Diagnosis present

## 2021-10-07 DIAGNOSIS — K219 Gastro-esophageal reflux disease without esophagitis: Secondary | ICD-10-CM | POA: Diagnosis present

## 2021-10-07 DIAGNOSIS — I10 Essential (primary) hypertension: Secondary | ICD-10-CM | POA: Diagnosis present

## 2021-10-07 DIAGNOSIS — Z79899 Other long term (current) drug therapy: Secondary | ICD-10-CM

## 2021-10-07 DIAGNOSIS — E669 Obesity, unspecified: Secondary | ICD-10-CM | POA: Diagnosis present

## 2021-10-07 DIAGNOSIS — Z87820 Personal history of traumatic brain injury: Secondary | ICD-10-CM

## 2021-10-07 DIAGNOSIS — Z6833 Body mass index (BMI) 33.0-33.9, adult: Secondary | ICD-10-CM

## 2021-10-07 DIAGNOSIS — R1310 Dysphagia, unspecified: Secondary | ICD-10-CM

## 2021-10-07 DIAGNOSIS — R531 Weakness: Principal | ICD-10-CM

## 2021-10-07 DIAGNOSIS — R5381 Other malaise: Secondary | ICD-10-CM | POA: Diagnosis present

## 2021-10-07 HISTORY — DX: Unspecified intracranial injury with loss of consciousness status unknown, initial encounter: S06.9XAA

## 2021-10-07 HISTORY — DX: Dysphagia, unspecified: R13.10

## 2021-10-07 HISTORY — DX: Benign prostatic hyperplasia without lower urinary tract symptoms: N40.0

## 2021-10-07 LAB — URINALYSIS, ROUTINE W REFLEX MICROSCOPIC
Bilirubin Urine: NEGATIVE
Glucose, UA: NEGATIVE mg/dL
Hgb urine dipstick: NEGATIVE
Ketones, ur: NEGATIVE mg/dL
Nitrite: NEGATIVE
Protein, ur: NEGATIVE mg/dL
Specific Gravity, Urine: 1.028 (ref 1.005–1.030)
pH: 5 (ref 5.0–8.0)

## 2021-10-07 LAB — CBC
HCT: 35.8 % — ABNORMAL LOW (ref 39.0–52.0)
HCT: 40.8 % (ref 39.0–52.0)
Hemoglobin: 11.9 g/dL — ABNORMAL LOW (ref 13.0–17.0)
Hemoglobin: 13.2 g/dL (ref 13.0–17.0)
MCH: 29.2 pg (ref 26.0–34.0)
MCH: 29.8 pg (ref 26.0–34.0)
MCHC: 33.2 g/dL (ref 30.0–36.0)
MCHC: 32.4 g/dL (ref 30.0–36.0)
MCV: 87.7 fL (ref 80.0–100.0)
MCV: 92.1 fL (ref 80.0–100.0)
Platelets: 403 10*3/uL — ABNORMAL HIGH (ref 150–400)
Platelets: 452 10*3/uL — ABNORMAL HIGH (ref 150–400)
RBC: 4.08 MIL/uL — ABNORMAL LOW (ref 4.22–5.81)
RBC: 4.43 MIL/uL (ref 4.22–5.81)
RDW: 13.2 % (ref 11.5–15.5)
RDW: 13.6 % (ref 11.5–15.5)
WBC: 13.3 10*3/uL — ABNORMAL HIGH (ref 4.0–10.5)
WBC: 13.1 10*3/uL — ABNORMAL HIGH (ref 4.0–10.5)
nRBC: 0 % (ref 0.0–0.2)
nRBC: 0 % (ref 0.0–0.2)

## 2021-10-07 LAB — COMPREHENSIVE METABOLIC PANEL
ALT: 67 U/L — ABNORMAL HIGH (ref 0–44)
AST: 72 U/L — ABNORMAL HIGH (ref 15–41)
Albumin: 2.3 g/dL — ABNORMAL LOW (ref 3.5–5.0)
Alkaline Phosphatase: 91 U/L (ref 38–126)
Anion gap: 15 (ref 5–15)
BUN: 17 mg/dL (ref 8–23)
CO2: 22 mmol/L (ref 22–32)
Calcium: 8.5 mg/dL — ABNORMAL LOW (ref 8.9–10.3)
Chloride: 101 mmol/L (ref 98–111)
Creatinine, Ser: 0.66 mg/dL (ref 0.61–1.24)
GFR, Estimated: >60 mL/min (ref >60)
Glucose, Bld: 106 mg/dL — ABNORMAL HIGH (ref 70–99)
Potassium: 5.1 mmol/L (ref 3.5–5.1)
Sodium: 138 mmol/L (ref 135–145)
Total Bilirubin: 3 mg/dL — ABNORMAL HIGH (ref 0.3–1.2)
Total Protein: 7 g/dL (ref 6.5–8.1)

## 2021-10-07 LAB — CBG MONITORING, ED: Glucose-Capillary: 113 mg/dL — ABNORMAL HIGH (ref 70–99)

## 2021-10-07 LAB — LACTIC ACID, PLASMA: Lactic Acid, Venous: 1.5 mmol/L (ref 0.5–1.9)

## 2021-10-07 MED ORDER — PANTOPRAZOLE SODIUM 40 MG PO TBEC: 40.0000 mg | DELAYED_RELEASE_TABLET | Freq: Two times a day (BID) | ORAL | Status: DC

## 2021-10-07 MED ORDER — CEFDINIR 300 MG PO CAPS: 300.0000 mg | ORAL_CAPSULE | Freq: Two times a day (BID) | ORAL | 0 refills | Status: DC

## 2021-10-07 MED ORDER — SODIUM CHLORIDE 0.9 % IV SOLN
1.0000 g | Freq: Once | INTRAVENOUS | Status: AC
  Administered 2021-10-07: 1 g via INTRAVENOUS
  Filled 2021-10-07: qty 10

## 2021-10-07 MED ORDER — SODIUM CHLORIDE 0.9 % IV SOLN
500.0000 mg | Freq: Once | INTRAVENOUS | Status: AC
  Administered 2021-10-08: 500 mg via INTRAVENOUS
  Filled 2021-10-07: qty 5

## 2021-10-07 MED ORDER — POLYETHYLENE GLYCOL 3350 17 GM/SCOOP PO POWD: 17.0000 g | Freq: Every day | ORAL | 0 refills | Status: DC

## 2021-10-07 MED ORDER — AZITHROMYCIN 250 MG PO TABS: 250.0000 mg | ORAL_TABLET | Freq: Every day | ORAL | 0 refills | Status: DC

## 2021-10-07 MED ORDER — METOPROLOL SUCCINATE ER 50 MG PO TB24: 50.0000 mg | ORAL_TABLET | Freq: Two times a day (BID) | ORAL | Status: DC

## 2021-10-07 NOTE — Plan of Care (Signed)
Patient discharged per MD orders at this time.All discharge instructions, education and medications reviewed with the patient.Patient expressed understanding and will comply with dc instructions.follow up appointments was also communicated to the patient.no verbal c/o or any ssx of distress. Patient was discharged to the [facility] in [place].report was called in to [facility] nurse before transport.patient was transported by 2 EMS personnel on a stretcher. ?
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
## 2021-10-07 NOTE — Discharge Instructions (Addendum)
Follow with Primary MD /SNF physician ? ?Get CBC, CMP, 2 view Chest X ray in 1 week. ? ? ?Activity: As tolerated with Full fall precautions use walker/cane & assistance as needed ? ? ?Disposition  SNF ? ? ?Diet: Dysphagia 2 with thin liquid ? ? ?On your next visit with your primary care physician please Get Medicines reviewed and adjusted. ? ? ?Please request your Prim.MD to go over all Hospital Tests and Procedure/Radiological results at the follow up, please get all Hospital records sent to your Prim MD by signing hospital release before you go home. ? ? ?If you experience worsening of your admission symptoms, develop shortness of breath, life threatening emergency, suicidal or homicidal thoughts you must seek medical attention immediately by calling 911 or calling your MD immediately  if symptoms less severe. ? ?You Must read complete instructions/literature along with all the possible adverse reactions/side effects for all the Medicines you take and that have been prescribed to you. Take any new Medicines after you have completely understood and accpet all the possible adverse reactions/side effects.  ? ?Do not drive when taking Pain medications.  ? ? ?Do not take more than prescribed Pain, Sleep and Anxiety Medications ? ?Special Instructions: If you have smoked or chewed Tobacco  in the last 2 yrs please stop smoking, stop any regular Alcohol  and or any Recreational drug use. ? ?Wear Seat belts while driving. ? ? ?Please note ? ?You were cared for by a hospitalist during your hospital stay. If you have any questions about your discharge medications or the care you received while you were in the hospital after you are discharged, you can call the unit and asked to speak with the hospitalist on call if the hospitalist that took care of you is not available. Once you are discharged, your primary care physician will handle any further medical issues. Please note that NO REFILLS for any discharge medications will  be authorized once you are discharged, as it is imperative that you return to your primary care physician (or establish a relationship with a primary care physician if you do not have one) for your aftercare needs so that they can reassess your need for medications and monitor your lab values.  ?
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
## 2021-10-07 NOTE — TOC Progression Note (Signed)
Transition of Care (TOC) - Progression Note  ? ? ?Patient Details  ?Name: Austin Conner ?MRN: [id number] ?Date of Birth: September 27, 1955 ? ?Transition of Care (TOC) CM/SW Contact  ?Conception Oms, RN ?Phone Number: ?10/07/2021, 3:11 PM ? ?Clinical Narrative:    ?Patient going to room 108 at [facility] called, Sister made aware by physician, he is number 7 on list ? ? ?Expected Discharge Plan: [place] ?Barriers to Discharge: Continued Medical Work up ? ?Expected Discharge Plan and Services ?Expected Discharge Plan: [place] ?  ?Discharge Planning Services: CM Consult ?  ?Living arrangements for the past 2 months: [place] ?Expected Discharge Date: 10/07/21               ?DME Arranged: N/A ?  ?  ?  ?  ?  ?  ?  ?  ?  ? ? ?Social Determinants of Health (SDOH) Interventions ?  ? ?Readmission Risk Interventions ?No flowsheet data found. ? ?

## 2021-10-07 NOTE — ED Provider Notes (Signed)
?[facility] ?Provider Note ? ? ?CSN: [id number] ?Arrival date & time: 10/07/21  2003 ? ?  ? ?History ? ?Chief Complaint  ?Patient presents with  ? Weakness  ? ? ?Austin Conner is a 66 y.o. male. ? ?Patient s/p recent d/c from hospital this afternoon. Family member visited at SNF and felt patient continued to be generally weak appearing, and also indicates urine seemed concentrated/strong. Pt denies new c/o - notes persistent non prod cough. Denies choking/gagging. No sob. No chest pain. No sore throat. Denies focal abd pain, but some general abd 'upset.  No vomiting. Having more frequent bms but no severe diarrhea. Denies chills/sweats.  No flank pain. No extremity pain/swelling.  ? ?The history is provided by the patient, a relative, medical records and the EMS personnel. The history is limited by the condition of the patient.  ? ?  ? ?Home Medications ?Prior to Admission medications   ?Medication Sig Start Date End Date Taking? Authorizing Provider  ?acetaminophen (TYLENOL) 325 MG tablet Take 2 tablets (650 mg total) by mouth every 6 (six) hours as needed for moderate pain. 09/09/21   Jaynee Eagles, PA-C  ?albuterol (PROVENTIL) (2.5 MG/3ML) 0.083% nebulizer solution Take 2.5 mg by nebulization 3 (three) times daily. 12/17/20   [provider]  ?albuterol (VENTOLIN HFA) 108 (90 Base) MCG/ACT inhaler Inhale 2 puffs into the lungs every 4 (four) hours as needed for wheezing or shortness of breath. 06/13/19   [provider]  ?benzonatate (TESSALON) 100 MG capsule Take 1-2 capsules (100-200 mg total) by mouth 3 (three) times daily as needed for cough. 09/09/21   Jaynee Eagles, PA-C  ?CRANBERRY-CALCIUM PO Take 425 mg by mouth daily at 12 noon.    [provider]  ?dantrolene (DANTRIUM) 100 MG capsule Take 100 mg by mouth 2 (two) times daily. 08/25/21   [provider]  ?DULoxetine (CYMBALTA) 60 MG capsule Take 60 mg by mouth daily. 04/03/21   [provider]  ?guaiFENesin (MUCINEX) 600 MG 12 hr tablet Take 600 mg by mouth 2 (two) times daily.    [provider]  ?losartan (COZAAR) 100 MG tablet Take 100 mg by mouth daily. 02/21/21   [provider]  ?metoprolol succinate (TOPROL-XL) 50 MG 24 hr tablet Take 1 tablet (50 mg total) by mouth 2 (two) times daily. Take with or immediately following a meal. 10/07/21   Ezekiel Slocumb, DO  ?Multiple Vitamin (DAILY VITES) tablet Take 1 tablet by mouth daily.    [provider]  ?pantoprazole (PROTONIX) 40 MG tablet Take 1 tablet (40 mg total) by mouth 2 (two) times daily before a meal. 10/07/21   Nicole Kindred A, DO  ?polyethylene glycol powder (GLYCOLAX/MIRALAX) 17 GM/SCOOP powder Take 17 g by mouth at bedtime. Hold if loose of frequent stools 10/07/21   Nicole Kindred A, DO  ?promethazine-dextromethorphan (PROMETHAZINE-DM) 6.25-15 MG/5ML syrup Take 5 mLs by mouth at bedtime as needed for cough. 09/09/21   Jaynee Eagles, PA-C  ?tizanidine (ZANAFLEX) 6 MG capsule Take 6 mg by mouth 3 (three) times daily. 04/08/21   [provider]  ?   ? ?Allergies    ?Ciprofloxacin   ? ?Review of Systems   ?Review of Systems  ?Constitutional:  Negative for chills and fever.  ?HENT:  Negative for sore throat.   ?Eyes:  Negative for redness.  ?Respiratory:  Positive for cough. Negative for shortness of breath.   ?Cardiovascular:  Negative for chest pain.  ?  Gastrointestinal:  Negative for vomiting.  ?Genitourinary:  Negative for dysuria and flank pain.  ?Musculoskeletal:  Negative for back pain.  ?Skin:  Negative for rash.  ?Neurological:  Negative for headaches.  ?Hematological:  Does not bruise/bleed easily.  ?Psychiatric/Behavioral:  Negative for confusion.   ? ?Physical Exam ?Updated Vital Signs ?BP 135/88 (BP Location: Left Arm)   Pulse 96   Temp 99.4 ?F (37.4 ?C) (Rectal)   Resp 17   SpO2 92%  ?Physical Exam ?Vitals and nursing note reviewed.  ?Constitutional:   ?   Appearance: Normal  appearance. He is well-developed.  ?HENT:  ?   Head: Atraumatic.  ?   Nose: Nose normal.  ?   Mouth/Throat:  ?   Mouth: Mucous membranes are moist.  ?   Pharynx: Oropharynx is clear.  ?Eyes:  ?   General: No scleral icterus. ?   Conjunctiva/sclera: Conjunctivae normal.  ?   Pupils: Pupils are equal, round, and reactive to light.  ?Neck:  ?   Vascular: No carotid bruit.  ?   Trachea: No tracheal deviation.  ?   Comments: No stiffness or rigidity.  ?Cardiovascular:  ?   Rate and Rhythm: Normal rate and regular rhythm.  ?   Pulses: Normal pulses.  ?   Heart sounds: Normal heart sounds. No murmur heard. ?  No friction rub. No gallop.  ?Pulmonary:  ?   Effort: No accessory muscle usage or respiratory distress.  ?   Breath sounds: Normal breath sounds.  ?   Comments: Rales left base ?Abdominal:  ?   General: Bowel sounds are normal. There is no distension.  ?   Palpations: Abdomen is soft.  ?   Tenderness: There is abdominal tenderness. There is no guarding.  ?   Comments: Mild mid/general abd tenderness.  ?Genitourinary: ?   Comments: No cva tenderness. ?Musculoskeletal:     ?   General: No swelling or tenderness.  ?   Cervical back: Normal range of motion and neck supple. No rigidity.  ?Lymphadenopathy:  ?   Cervical: No cervical adenopathy.  ?Skin: ?   General: Skin is warm and dry.  ?   Findings: No rash.  ?   Comments: No sacral/buttock skin breakdown or infection.   ?Neurological:  ?   Mental Status: He is alert.  ?   Comments: Alert, speech dysarthric but understandable. Mental status and functional ability describe as being c/w baseline.   ?Psychiatric:     ?   Mood and Affect: Mood normal.  ? ? ?ED Results / Procedures / Treatments   ?Labs ?(all labs ordered are listed, but only abnormal results are displayed) ?Results for orders placed or performed during the hospital encounter of 10/07/21  ?CBC  ?Result Value Ref Range  ? WBC 13.1 (H) 4.0 - 10.5 K/uL  ? RBC 4.43 4.22 - 5.81 MIL/uL  ? Hemoglobin 13.2 13.0 - 17.0  g/dL  ? HCT 40.8 39.0 - 52.0 %  ? MCV 92.1 80.0 - 100.0 fL  ? MCH 29.8 26.0 - 34.0 pg  ? MCHC 32.4 30.0 - 36.0 g/dL  ? RDW 13.6 11.5 - 15.5 %  ? Platelets 452 (H) 150 - 400 K/uL  ? nRBC 0.0 0.0 - 0.2 %  ?Comprehensive metabolic panel  ?Result Value Ref Range  ? Sodium 138 135 - 145 mmol/L  ? Potassium 5.1 3.5 - 5.1 mmol/L  ? Chloride 101 98 - 111 mmol/L  ? CO2 22 22 - 32 mmol/L  ?  Glucose, Bld 106 (H) 70 - 99 mg/dL  ? BUN 17 8 - 23 mg/dL  ? Creatinine, Ser 0.66 0.61 - 1.24 mg/dL  ? Calcium 8.5 (L) 8.9 - 10.3 mg/dL  ? Total Protein 7.0 6.5 - 8.1 g/dL  ? Albumin 2.3 (L) 3.5 - 5.0 g/dL  ? AST 72 (H) 15 - 41 U/L  ? ALT 67 (H) 0 - 44 U/L  ? Alkaline Phosphatase 91 38 - 126 U/L  ? Total Bilirubin 3.0 (H) 0.3 - 1.2 mg/dL  ? GFR, Estimated >60 >60 mL/min  ? Anion gap 15 5 - 15  ?Lactic acid, plasma  ?Result Value Ref Range  ? Lactic Acid, Venous 1.5 0.5 - 1.9 mmol/L  ?Urinalysis, Routine w reflex microscopic Urine, Clean Catch  ?Result Value Ref Range  ? Color, Urine AMBER (A) YELLOW  ? APPearance HAZY (A) CLEAR  ? Specific Gravity, Urine 1.028 1.005 - 1.030  ? pH 5.0 5.0 - 8.0  ? Glucose, UA NEGATIVE NEGATIVE mg/dL  ? Hgb urine dipstick NEGATIVE NEGATIVE  ? Bilirubin Urine NEGATIVE NEGATIVE  ? Ketones, ur NEGATIVE NEGATIVE mg/dL  ? Protein, ur NEGATIVE NEGATIVE mg/dL  ? Nitrite NEGATIVE NEGATIVE  ? Leukocytes,Ua TRACE (A) NEGATIVE  ? RBC / HPF 6-10 0 - 5 RBC/hpf  ? WBC, UA 6-10 0 - 5 WBC/hpf  ? Bacteria, UA FEW (A) NONE SEEN  ? Squamous Epithelial / LPF 0-5 0 - 5  ? Mucus PRESENT   ?CBG monitoring, ED  ?Result Value Ref Range  ? Glucose-Capillary 113 (H) 70 - 99 mg/dL  ? ?DG Chest 2 View ? ?Result Date: 10/07/2021 ?CLINICAL DATA:  Cough EXAM: CHEST - 2 VIEW COMPARISON:  10/02/2021 FINDINGS: Frontal and lateral views of the chest demonstrate a stable cardiac silhouette. Continued ectasia of the thoracic aorta. Progressive patchy bibasilar consolidation. No effusion or pneumothorax. No acute bony abnormalities. IMPRESSION: 1.  Progressive bibasilar consolidation, consistent with bronchopneumonia. Electronically Signed   By: Randa Ngo M.D.   On: 10/07/2021 21:34  ? ?DG Chest 2 View ? ?Result Date: 09/09/2021 ?CLINICAL DATA:  [place]

## 2021-10-08 ENCOUNTER — Encounter (HOSPITAL_COMMUNITY): Payer: Self-pay | Admitting: Internal Medicine

## 2021-10-08 ENCOUNTER — Emergency Department (HOSPITAL_COMMUNITY): Payer: Medicare HMO

## 2021-10-08 ENCOUNTER — Other Ambulatory Visit: Payer: Self-pay

## 2021-10-08 DIAGNOSIS — E871 Hypo-osmolality and hyponatremia: Secondary | ICD-10-CM | POA: Diagnosis present

## 2021-10-08 DIAGNOSIS — H6123 Impacted cerumen, bilateral: Secondary | ICD-10-CM | POA: Diagnosis present

## 2021-10-08 DIAGNOSIS — R1312 Dysphagia, oropharyngeal phase: Secondary | ICD-10-CM | POA: Diagnosis not present

## 2021-10-08 DIAGNOSIS — Z9079 Acquired absence of other genital organ(s): Secondary | ICD-10-CM | POA: Diagnosis not present

## 2021-10-08 DIAGNOSIS — Z20822 Contact with and (suspected) exposure to covid-19: Secondary | ICD-10-CM | POA: Diagnosis present

## 2021-10-08 DIAGNOSIS — Z87820 Personal history of traumatic brain injury: Secondary | ICD-10-CM | POA: Diagnosis not present

## 2021-10-08 DIAGNOSIS — R5381 Other malaise: Secondary | ICD-10-CM | POA: Diagnosis present

## 2021-10-08 DIAGNOSIS — I10 Essential (primary) hypertension: Secondary | ICD-10-CM | POA: Diagnosis present

## 2021-10-08 DIAGNOSIS — E876 Hypokalemia: Secondary | ICD-10-CM | POA: Diagnosis present

## 2021-10-08 DIAGNOSIS — G8114 Spastic hemiplegia affecting left nondominant side: Secondary | ICD-10-CM | POA: Diagnosis present

## 2021-10-08 DIAGNOSIS — Z79899 Other long term (current) drug therapy: Secondary | ICD-10-CM | POA: Diagnosis not present

## 2021-10-08 DIAGNOSIS — N400 Enlarged prostate without lower urinary tract symptoms: Secondary | ICD-10-CM | POA: Diagnosis present

## 2021-10-08 DIAGNOSIS — G2581 Restless legs syndrome: Secondary | ICD-10-CM | POA: Diagnosis present

## 2021-10-08 DIAGNOSIS — Z515 Encounter for palliative care: Secondary | ICD-10-CM | POA: Diagnosis not present

## 2021-10-08 DIAGNOSIS — J690 Pneumonitis due to inhalation of food and vomit: Secondary | ICD-10-CM | POA: Diagnosis present

## 2021-10-08 DIAGNOSIS — Z9049 Acquired absence of other specified parts of digestive tract: Secondary | ICD-10-CM | POA: Diagnosis not present

## 2021-10-08 DIAGNOSIS — K219 Gastro-esophageal reflux disease without esophagitis: Secondary | ICD-10-CM | POA: Diagnosis present

## 2021-10-08 DIAGNOSIS — A419 Sepsis, unspecified organism: Secondary | ICD-10-CM | POA: Diagnosis present

## 2021-10-08 DIAGNOSIS — Z6833 Body mass index (BMI) 33.0-33.9, adult: Secondary | ICD-10-CM | POA: Diagnosis not present

## 2021-10-08 DIAGNOSIS — R1310 Dysphagia, unspecified: Secondary | ICD-10-CM | POA: Diagnosis not present

## 2021-10-08 DIAGNOSIS — E669 Obesity, unspecified: Secondary | ICD-10-CM | POA: Diagnosis present

## 2021-10-08 DIAGNOSIS — R531 Weakness: Secondary | ICD-10-CM | POA: Diagnosis not present

## 2021-10-08 LAB — LACTIC ACID, PLASMA
Lactic Acid, Venous: 1 mmol/L (ref 0.5–1.9)
Lactic Acid, Venous: 0.9 mmol/L (ref 0.5–1.9)

## 2021-10-08 LAB — PROCALCITONIN: Procalcitonin: 0.25 ng/mL

## 2021-10-08 MED ORDER — ACETAMINOPHEN 650 MG RE SUPP
650.0000 mg | Freq: Four times a day (QID) | RECTAL | Status: DC | PRN
  Administered 2021-10-09: 650 mg via RECTAL
  Filled 2021-10-08: qty 1

## 2021-10-08 MED ORDER — BISACODYL 10 MG RE SUPP: 10.0000 mg | Freq: Every day | RECTAL | Status: DC | PRN

## 2021-10-08 MED ORDER — SODIUM CHLORIDE 0.9% FLUSH
3.0000 mL | Freq: Two times a day (BID) | INTRAVENOUS | Status: DC
  Administered 2021-10-09 – 2021-10-10 (×2): 3 mL via INTRAVENOUS

## 2021-10-08 MED ORDER — ACETAMINOPHEN 325 MG PO TABS: 325.0000 mg | ORAL_TABLET | Freq: Once | ORAL | Status: AC

## 2021-10-08 MED ORDER — ONDANSETRON HCL 4 MG/2ML IJ SOLN: 4.0000 mg | Freq: Four times a day (QID) | INTRAMUSCULAR | Status: DC | PRN

## 2021-10-08 MED ORDER — LACTATED RINGERS IV BOLUS (SEPSIS)
1000.0000 mL | Freq: Once | INTRAVENOUS | Status: AC
  Administered 2021-10-08: 1000 mL via INTRAVENOUS

## 2021-10-08 MED ORDER — IOHEXOL 300 MG/ML  SOLN
100.0000 mL | Freq: Once | INTRAMUSCULAR | Status: AC | PRN
  Administered 2021-10-08: 100 mL via INTRAVENOUS

## 2021-10-08 MED ORDER — SODIUM CHLORIDE 0.9 % IV SOLN: 2.0000 g | INTRAVENOUS | Status: DC

## 2021-10-08 MED ORDER — LACTATED RINGERS IV SOLN: INTRAVENOUS | Status: DC

## 2021-10-08 MED ORDER — SODIUM CHLORIDE 0.9 % IV SOLN: INTRAVENOUS | Status: DC

## 2021-10-08 MED ORDER — SODIUM CHLORIDE 0.9 % IV SOLN
500.0000 mg | INTRAVENOUS | Status: DC
  Filled 2021-10-08: qty 5

## 2021-10-08 MED ORDER — ACETAMINOPHEN 325 MG PO TABS
ORAL_TABLET | ORAL | Status: AC
  Administered 2021-10-08: 325 mg via ORAL
  Filled 2021-10-08: qty 1

## 2021-10-08 MED ORDER — ENOXAPARIN SODIUM 80 MG/0.8ML IJ SOSY
0.5000 mg/kg | PREFILLED_SYRINGE | INTRAMUSCULAR | Status: DC
  Filled 2021-10-08: qty 0.63

## 2021-10-08 MED ORDER — MORPHINE SULFATE (PF) 2 MG/ML IV SOLN: 2.0000 mg | INTRAVENOUS | Status: DC | PRN

## 2021-10-08 MED ORDER — PIPERACILLIN-TAZOBACTAM 3.375 G IVPB
3.3750 g | Freq: Three times a day (TID) | INTRAVENOUS | Status: DC
  Administered 2021-10-08 – 2021-10-10 (×6): 3.375 g via INTRAVENOUS
  Filled 2021-10-08 (×8): qty 50

## 2021-10-08 MED ORDER — ALBUTEROL SULFATE (2.5 MG/3ML) 0.083% IN NEBU: 2.5000 mg | INHALATION_SOLUTION | RESPIRATORY_TRACT | Status: DC | PRN

## 2021-10-08 MED ORDER — ENOXAPARIN SODIUM 60 MG/0.6ML IJ SOSY
60.0000 mg | PREFILLED_SYRINGE | INTRAMUSCULAR | Status: DC
  Administered 2021-10-08 – 2021-10-09 (×2): 60 mg via SUBCUTANEOUS
  Filled 2021-10-08 (×3): qty 0.6

## 2021-10-08 MED ORDER — HYDRALAZINE HCL 20 MG/ML IJ SOLN
5.0000 mg | INTRAMUSCULAR | Status: DC | PRN
  Administered 2021-10-09: 5 mg via INTRAVENOUS
  Filled 2021-10-08: qty 1

## 2021-10-08 MED ORDER — ACETAMINOPHEN 500 MG PO TABS
1000.0000 mg | ORAL_TABLET | Freq: Once | ORAL | Status: DC
  Filled 2021-10-08: qty 2

## 2021-10-08 NOTE — ED Provider Notes (Signed)
?  Physical Exam  ?BP (!) 160/83   Pulse (!) 114   Temp (!) 101.6 ?F (38.7 ?C) (Rectal)   Resp (!) 21   Ht '6\' 4"'$  (1.93 m)   Wt 126.4 kg   SpO2 92%   BMI 33.92 kg/m?  ? ? ? ?Procedures  ?Marland KitchenCritical Care ?Performed by: Regan Lemming, MD ?Authorized by: Regan Lemming, MD  ? ?Critical care provider statement:  ?  Critical care time (minutes):  30 ?  Critical care was necessary to treat or prevent imminent or life-threatening deterioration of the following conditions:  Sepsis ?  Critical care was time spent personally by me on the following activities:  Development of treatment plan with patient or surrogate, evaluation of patient's response to treatment, examination of patient, obtaining history from patient or surrogate, ordering and performing treatments and interventions, ordering and review of laboratory studies, ordering and review of radiographic studies, pulse oximetry, re-evaluation of patient's condition and review of old charts ?  Care discussed with: admitting provider   ? ?ED Course / MDM  ?  ?Medical Decision Making ?Amount and/or Complexity of Data Reviewed ?Labs: ordered. ?Radiology: ordered. ?ECG/medicine tests: ordered. ? ?Risk ?OTC drugs. ?Prescription drug management. ?Decision regarding hospitalization. ? ? ? ? ?Received the patient in signout from the overnight team.  The patient previously diagnosed with commune acquired pneumonia.  Was pending SNF placement in the ED after discharge with plan for outpatient treatment for CAP.  Patient has progressively become more tachycardic and tachypneic overnight in the ED with borderline low oxygen saturations.  He meet SIRS criteria at this point with a known infiltrate.  30 cc/kg of LR ordered and antibiotics were reordered.  We will plan to admit the patient to the hospital due to concern for sepsis and declining clinical status. Dr. Lorin Mercy of hospitalist medicine accepted the patient in admission. ? ? ?  ?Regan Lemming, MD ?10/08/21 1331 ? ?

## 2021-10-08 NOTE — Assessment & Plan Note (Signed)
-  Patient with recurrent issues with dysphagia predating last admission ?-He was supposed to be discharged to SNF with a dysphagia 2 diet but it is unclear if this happened ?-Regardless, he is back with likely aspiration PNA, likely related to his dysphagia ?-Will leave NPO until ST evaluation ?

## 2021-10-08 NOTE — Assessment & Plan Note (Signed)
-  Patient with recurrent/advanced weakness ?-Imaging is c/w aspiration PNA, although HCAP is also a consideration ?-For now, NPO and will need swallow evaluation prior to diet ?-If he has progressive dyscoordinated swallow, there may need to be discussion regarding PEG tube vs. comfort care; palliative care has been consulted ?-Will admit and monitor on telemetry ?-Will cover with Zosyn for now due to high suspicion for pneumonia - aspiration/HCAP ?-Will order lower respiratory tract procalcitonin level.   >0.5 indicates infection and >>0.5 indicates more serious disease.  As the procalcitonin level normalizes, it will be reasonable to consider de-escalation of antibiotic coverage.  The sensitivity of procalcitonin is variable and should not be used alone to guide treatment. ?

## 2021-10-08 NOTE — Progress Notes (Addendum)
PHARMACIST - PHYSICIAN COMMUNICATION ? ?CONCERNING:  Enoxaparin (Lovenox) for DVT Prophylaxis  ? ? ?RECOMMENDATION: ?Patient was prescribed enoxaprin '40mg'$  q24 hours for VTE prophylaxis.  ? Danley Danker Weights  ? 10/08/21 0734  ?Weight: 126.4 kg (278 lb 10.6 oz)  ? ? ?Body mass index is 33.92 kg/m?. ? ?Estimated Creatinine Clearance: 133.6 mL/min (by C-G formula based on SCr of 0.66 mg/dL). ? ? ?Based on [place] patient is candidate for enoxaparin 0.'5mg'$ /kg TBW SQ every 24 hours based on BMI being >30. ? ?DESCRIPTION: ?Pharmacy has adjusted enoxaparin dose per [facility] policy. ? ?Patient is now receiving enoxaparin 62.5 mg every 24 hours  ? ? ?Berta Minor, PharmD ?Clinical Pharmacist  ?10/08/2021 ?3:09 PM ? ?
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
## 2021-10-08 NOTE — Progress Notes (Signed)
Transition of Care [facility]) - Emergency Department Mini Assessment ? ? ?Patient Details  ?Name: Austin Conner ?MRN: [id number] ?Date of Birth: 27-May-1956 ? ?Transition of Care (TOC) CM/SW Contact:    ?Fuller Mandril, RN ?Phone Number: ?10/08/2021, 11:23 AM ? ? ?Clinical Narrative: ?[facility] [address], [place], [place], [place] [phone] ?EDSW spoke with pt POA at bedside regarding discharge planning for [facility]. Offered pt medicare.gov list of home health agencies to choose from.  Pt chose [facility] to render services. Sharmon Revere of [facility] notified. Patient made aware that [facility] will be in contact in 24-48 hours.  No DME needs identified at this time. ? ? ? ?ED Mini Assessment: ?What brought you to the Emergency Department? : (P) Did not like previous SNF ? ?Barriers to Discharge: (P) ED Barriers Resolved ? ?  ? ?  ? ?Interventions which prevented an admission or readmission: (P) [place] or Services ? ? ? ?Patient Contact and Communications ?  ?  ?  ? ,     ?  ?  ? ?  ?CMS Medicare.gov Compare Post Acute Care list provided to:: (P) Patient Represenative (must comment) ?Choice offered to / list presented to : (P) HC POA / Guardian, Sibling ? ?Admission diagnosis:  weakness ?Patient Active Problem List  ? Diagnosis Date Noted  ? Shortness of breath   ? Weakness   ? Obesity (BMI 30-39.9) 09/26/2021  ? Debility 09/26/2021  ? Aspiration pneumonia ([place]) 09/26/2021  ? Dysphagia 09/26/2021  ? Essential hypertension 09/26/2021  ? Hypokalemia 09/26/2021  ? Syncope 09/25/2021  ? ?PCP:  System, Provider Not In ?Pharmacy:   ?[facility] ?[place] ?[place] [address] ?Phone: [phone] Fax: [phone] ? ?[id number] - [place], [address] ?[place] [address] ?Phone: [phone] Fax: [phone] ?   ?

## 2021-10-08 NOTE — Assessment & Plan Note (Signed)
-  Patient with h/o prior hospitalization ?-PT and OT recommended SNF. ?-It is difficult to tell when symptoms restarted ?-He is clearly lethargic at this time ?-Based on current evaluation, he appears very unlikely to be a CIR candidate and equally unlikely to be able to be cared for at home ?-Family was profoundly displeased with his facility (despite being there for roughly 6 hours) and will request an alternate facility if this is required ?-TOC consult requested ?-Also needs repeat PT/OT consults ?

## 2021-10-08 NOTE — Progress Notes (Signed)
CSW spoke with patients sister Austin Conner who stated the facility was not clean and she couldn't leave her brother there. Austin Conner also stated her brother seemed to be sick so she brought him to the ED. CSW asked what her plan of care was for her brother due to him being checked out AMA. Patients sister stated she cannot care for him. CSW  explained with patient leaving AMA that it could cause problems with him getting into another facility. CSW also mentioned to Austin Conner that patients insurance can take up to a week or more for authorization. Patient was previously denied auth by [facility] before discharging to [facility] and a peer to peer was requested. CSW explained she cannot guarantee she will like the next SNF facility and it would not be good for patient to continue to leave facilities AMA. Austin Conner stated at this point she would just like to take patient home. Austin Conner requested home health. CSW did explain that home health only comes out 1-2 times a week and would not be the same as SNF. Austin Conner stated that was fine.  ?

## 2021-10-08 NOTE — ED Notes (Signed)
Pt is sleeping, vitals are WNL, sister is at the bedside. Expresses no concerns at this time ?

## 2021-10-08 NOTE — ED Notes (Signed)
Pt eating breakfast 

## 2021-10-08 NOTE — Assessment & Plan Note (Signed)
-  Resume Toprol XL when alert enough to take PO ?-For now, will use prn IV hydralazine ?

## 2021-10-08 NOTE — Sepsis Progress Note (Signed)
Notified bedside nurse of need to draw lactic acid.  

## 2021-10-08 NOTE — Progress Notes (Signed)
CSW spoke with [facility] to find out why patient left the facility after a few hours of discharging to [facility]. [facility] stated patient sister got upset because there was leaves on the floor due to the door being open with other members going in and out the door. CSW was told that patient cannot return.  ? ? ? ? ? ? ? ? ?

## 2021-10-08 NOTE — Progress Notes (Signed)
Pharmacy Antibiotic Note ? ?Austin Conner is a 66 y.o. male admitted on 10/07/2021 presenting from SNF after discharge yesterday and tx for pna, worsening sx, was on ceftriaxone >Unasyn>augmentin.  MRSA PCR negative 5d ago, broadening coverage.  Pharmacy has been consulted for zosyn dosing. ? ?Plan: ?Zosyn 3.375g IV q 8h (extended 4h infusion) ?Monitor renal function, Cx and clinical progression to narrow vs transition to PO ? ?Height: '6\' 4"'$  (193 cm) ?Weight: 126.4 kg (278 lb 10.6 oz) ?IBW/kg (Calculated) : 86.8 ? ?Temp (24hrs), Avg:99.5 ?F (37.5 ?C), Min:97.4 ?F (36.3 ?C), Max:101.6 ?F (38.7 ?C) ? ?Recent Labs  ?Lab 10/02/21 ?0440 10/02/21 ?1118 10/03/21 ?0403 10/04/21 ?0459 10/05/21 ?0457 10/06/21 ?0321 10/07/21 ?0867 10/07/21 ?2028  ?WBC 15.3*  --  13.5* 14.4* 12.3* 14.5* 13.3* 13.1*  ?CREATININE 0.70  --  0.65  --   --   --   --  0.66  ?LATICACIDVEN  --  0.8  --   --   --   --   --  1.5  ?  ?Estimated Creatinine Clearance: 133.6 mL/min (by C-G formula based on SCr of 0.66 mg/dL).   ? ?Allergies  ?Allergen Reactions  ? Ciprofloxacin Hives  ?  Other reaction(s): neurological reaction  ? ? ?Bertis Ruddy, PharmD ?Clinical Pharmacist ?ED Pharmacist Phone # [phone] ?10/08/2021 3:15 PM ? ?

## 2021-10-08 NOTE — Assessment & Plan Note (Signed)
-  Body mass index is 33.92 kg/m?Marland Kitchen.  ?-Weight loss should be encouraged ?-Outpatient PCP/bariatric medicine f/u encouraged ?

## 2021-10-08 NOTE — Assessment & Plan Note (Signed)
-  Patient was able to transfer and communicate at baseline ?-He will need to be able to transfer for his sister to be able to care for him at home ?-If he is able to do this during the hospitalization, she prefers to take him directly home ?-She is aware that this is unlikely, however ?

## 2021-10-08 NOTE — ED Notes (Signed)
Difficulties w/venipuncture for lactic   ?

## 2021-10-08 NOTE — Sepsis Progress Note (Addendum)
eLink is following this Code Sepsis. ? ?Requested provider order a lactic acid. Pt had one drawn on 3/14 2023, but this may have changed after pt started showing signs of sepsis. ?

## 2021-10-08 NOTE — ED Notes (Signed)
Pt temp checked and breakfast tray ordered  ?

## 2021-10-08 NOTE — ED Notes (Signed)
Patient is resting comfortably. 

## 2021-10-09 ENCOUNTER — Inpatient Hospital Stay (HOSPITAL_COMMUNITY): Payer: Medicare HMO

## 2021-10-09 DIAGNOSIS — R1312 Dysphagia, oropharyngeal phase: Secondary | ICD-10-CM

## 2021-10-09 DIAGNOSIS — Z7189 Other specified counseling: Secondary | ICD-10-CM

## 2021-10-09 DIAGNOSIS — Z515 Encounter for palliative care: Secondary | ICD-10-CM

## 2021-10-09 LAB — BASIC METABOLIC PANEL
Anion gap: 14 (ref 5–15)
BUN: 10 mg/dL (ref 8–23)
CO2: 24 mmol/L (ref 22–32)
Calcium: 8 mg/dL — ABNORMAL LOW (ref 8.9–10.3)
Chloride: 100 mmol/L (ref 98–111)
Creatinine, Ser: 0.48 mg/dL — ABNORMAL LOW (ref 0.61–1.24)
GFR, Estimated: >60 mL/min (ref >60)
Glucose, Bld: 87 mg/dL (ref 70–99)
Potassium: 3.7 mmol/L (ref 3.5–5.1)
Sodium: 138 mmol/L (ref 135–145)

## 2021-10-09 LAB — URINE CULTURE: Culture: <10000 — AB

## 2021-10-09 LAB — MRSA NEXT GEN BY PCR, NASAL: MRSA by PCR Next Gen: NOT DETECTED

## 2021-10-09 LAB — CBC
HCT: 32 % — ABNORMAL LOW (ref 39.0–52.0)
Hemoglobin: 10.6 g/dL — ABNORMAL LOW (ref 13.0–17.0)
MCH: 29.4 pg (ref 26.0–34.0)
MCHC: 33.1 g/dL (ref 30.0–36.0)
MCV: 88.9 fL (ref 80.0–100.0)
Platelets: 446 10*3/uL — ABNORMAL HIGH (ref 150–400)
RBC: 3.6 MIL/uL — ABNORMAL LOW (ref 4.22–5.81)
RDW: 13.4 % (ref 11.5–15.5)
WBC: 12.8 10*3/uL — ABNORMAL HIGH (ref 4.0–10.5)
nRBC: 0 % (ref 0.0–0.2)

## 2021-10-09 MED ORDER — METOPROLOL TARTRATE 5 MG/5ML IV SOLN
5.0000 mg | Freq: Four times a day (QID) | INTRAVENOUS | Status: DC
  Administered 2021-10-09 – 2021-10-10 (×4): 5 mg via INTRAVENOUS
  Filled 2021-10-09 (×3): qty 5

## 2021-10-09 NOTE — Progress Notes (Addendum)
?      ?                 PROGRESS NOTE ? ?      ?PATIENT DETAILS ?Name: Austin Conner ?Age: 66 y.o. ?Sex: male ?Date of Birth: [birth year] ?Admit Date: 10/07/2021 ?Admitting Physician Karmen Bongo, MD ?[id number], Provider Not In ? ?Brief Summary: ?Patient is a 66 y.o.  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  See below for further details. ? ?Significant events: ?3/2-3/14>> hospitalization for syncope/aspiration pneumonitis-discharged to SNF ?3/14>> presented back to the ED from SNF in a few hours post discharge-due to ongoing lethargy/weakness/fever-thought to have aspiration PNA.  Admitted to [facility]. ? ?Significant studies: ?3/14>> CXR: Bibasilar consolidation ?3/15>> CT abdomen/pelvis: Bilateral infiltrates-no acute process in the abdomen. ? ?Significant microbiology data: ?3/13>> COVID/influenza PCR: Negative ?3/14>> urine culture: Pending ?3/15>> blood culture: No growth ? ?Procedures: ?None ? ?Consults: ?None  ? ?Subjective: ?Some drooling of saliva this morning-febrile this morning. ? ?Objective: ?Vitals: ?Blood pressure (!) 167/64, pulse (!) 112, temperature 100 ?F (37.8 ?C), temperature source Axillary, resp. rate (!) 28, height '6\' 4"'$  (1.93 m), weight 126.4 kg, SpO2 94 %.  ? ?Exam: ?Gen Exam: Not in distress. ?HEENT:atraumatic, normocephalic ?Chest: B/L clear to auscultation anteriorly ?CVS:S1S2 regular ?Abdomen:soft non tender, non distended ?Extremities:no edema ?Neurology: Appears to have chronic left-sided spastic hemiplegia at baseline.  Dysarthric at baseline. ?Skin: no rash ? ?Pertinent Labs/Radiology: ?CBC Latest Ref Rng & Units 10/09/2021 10/07/2021 10/07/2021  ?WBC 4.0 - 10.5 K/uL 12.8(H) 13.1(H) 13.3(H)  ?Hemoglobin 13.0 - 17.0 g/dL 10.6(L) 13.2 11.9(L)  ?Hematocrit 39.0 - 52.0 % 32.0(L) 40.8 35.8(L)  ?Platelets 150 - 400 K/uL  446(H) 452(H) 403(H)  ?  ?Lab Results  ?Component Value Date  ? NA 138 10/09/2021  ? K 3.7 10/09/2021  ? CL 100 10/09/2021  ? CO2 24 10/09/2021  ?  ? ? ?Assessment/Plan: ?SIRS due to aspiration pneumonia: Febrile this morning-appears to be drooling saliva this morning-continue Zosyn-keep n.p.o. until seen by SLP.  Check MRSA nasal screen-if positive may need to be started on Vancomycin.  Difficult situation-Sister-aware of tenuous clinical condition-risk of further aspiration episodes-plan is to continue with IV antibiotics-we will await further goals of care discussion with family by the palliative care team ? ?Note-sepsis ruled out. ? ?Remote history of TBI with left-sided spastic hemiplegia-chronic ambulatory dysfunction (uses wheelchair at home-and able to take a few steps at baseline)-chronic dysarthria/dysphagia: Await evaluation by PT/OT and SLP.  Holding dantrolene/tizanidine as NPO. ? ?Minimally elevated transaminases/bilirubin: Repeat with a.m. labs-abdominal exam is benign.  Unclear significance. ? ?HTN: BP elevated this morning-currently n.p.o. and oral metoprolol on hold-we will start him on IV metoprolol. ? ?GERD: Continue PPI ? ?BPH-s/p TURP 2012 ? ?History of aortic root dilatation: Stable for outpatient follow-up with PCP ? ?Obesity: ?Estimated body mass index is 33.92 kg/m? as calculated from the following: ?  Height as of this encounter: '6\' 4"'$  (1.93 m). ?  Weight as of this encounter: 126.4 kg.  ? ?Code status: ?  Code Status: Full Code  ? ?DVT Prophylaxis:  Prophylactic Lovenox ? ?Family Communication: Austin Conner [phone] updated over the phone 3/16 ? ? ?Disposition Plan: ?Status is: Inpatient ?Remains inpatient appropriate because: Ongoing aspiration pneumonia-febrile-not stable for discharge. ?  ?Planned Discharge Destination:Skilled nursing facility ? ? ?Diet: ?Diet Order   ? ?       ?  Diet NPO  time specified  Diet effective now       ?  ? ?  ?  ? ?  ?  ? ? ?Antimicrobial  agents: ?Anti-infectives (From admission, onward)  ? ? Start     Dose/Rate Route Frequency Ordered Stop  ? 10/08/21 2200  cefTRIAXone (ROCEPHIN) 2 g in sodium chloride 0.9 % 100 mL IVPB  Status:  Discontinued       ? 2 g ?200 mL/hr over 30 Minutes Intravenous Every 24 hours 10/08/21 1111 10/08/21 1515  ? 10/08/21 2200  azithromycin (ZITHROMAX) 500 mg in sodium chloride 0.9 % 250 mL IVPB  Status:  Discontinued       ? 500 mg ?250 mL/hr over 60 Minutes Intravenous Every 24 hours 10/08/21 1111 10/08/21 1839  ? 10/08/21 1530  piperacillin-tazobactam (ZOSYN) IVPB 3.375 g       ? 3.375 g ?12.5 mL/hr over 240 Minutes Intravenous Every 8 hours 10/08/21 1515    ? 10/08/21 0000  cefdinir (OMNICEF) 300 MG capsule       ? 300 mg Oral 2 times daily 10/07/21 2323    ? 10/08/21 0000  azithromycin (ZITHROMAX Z-PAK) 250 MG tablet       ? 250 mg Oral Daily 10/07/21 2323 10/12/21 2359  ? 10/07/21 2245  cefTRIAXone (ROCEPHIN) 1 g in sodium chloride 0.9 % 100 mL IVPB       ? 1 g ?200 mL/hr over 30 Minutes Intravenous  Once 10/07/21 2234 10/08/21 0010  ? 10/07/21 2245  azithromycin (ZITHROMAX) 500 mg in sodium chloride 0.9 % 250 mL IVPB       ? 500 mg ?250 mL/hr over 60 Minutes Intravenous  Once 10/07/21 2234 10/08/21 0011  ? ?  ? ? ? ?MEDICATIONS: ?Scheduled Meds: ? enoxaparin (LOVENOX) injection  60 mg Subcutaneous Q24H  ? sodium chloride flush  3 mL Intravenous Q12H  ? ?Continuous Infusions: ? sodium chloride 75 mL/hr at 10/08/21 2119  ? piperacillin-tazobactam (ZOSYN)  IV 3.375 g (10/09/21 4332)  ? ?PRN Meds:.acetaminophen, albuterol, bisacodyl, hydrALAZINE, morphine injection, ondansetron (ZOFRAN) IV ? ? ?I have personally reviewed following labs and imaging studies ? ?LABORATORY DATA: ?CBC: ?Recent Labs  ?Lab 10/05/21 ?9518 10/06/21 ?0321 10/07/21 ?8416 10/07/21 ?2028 10/09/21 ?6063  ?WBC 12.3* 14.5* 13.3* 13.1* 12.8*  ?NEUTROABS 8.9*  --   --   --   --   ?HGB 11.6* 11.9* 11.9* 13.2 10.6*  ?HCT 34.9* 36.0* 35.8* 40.8 32.0*  ?MCV  88.1 87.8 87.7 92.1 88.9  ?PLT 332 360 403* 452* 446*  ? ? ?Basic Metabolic Panel: ?Recent Labs  ?Lab 10/03/21 ?0403 10/07/21 ?2028 10/09/21 ?0146  ?NA 143 138 138  ?K 3.8 5.1 3.7  ?CL 107 101 100  ?CO2 '28 22 24  '$ ?GLUCOSE 129* 106* 87  ?BUN '23 17 10  '$ ?CREATININE 0.65 0.66 0.48*  ?CALCIUM 8.3* 8.5* 8.0*  ? ? ?GFR: ?Estimated Creatinine Clearance: 133.6 mL/min (A) (by C-G formula based on SCr of 0.48 mg/dL (L)). ? ?Liver Function Tests: ?Recent Labs  ?Lab 10/07/21 ?2028  ?AST 72*  ?ALT 67*  ?ALKPHOS 91  ?BILITOT 3.0*  ?PROT 7.0  ?ALBUMIN 2.3*  ? ?No results for input(s): LIPASE, AMYLASE in the last 168 hours. ?No results for input(s): AMMONIA in the last 168 hours. ? ?Coagulation Profile: ?No results for input(s): INR, PROTIME in the last 168 hours. ? ?Cardiac Enzymes: ?No results for input(s): CKTOTAL, CKMB, CKMBINDEX, TROPONINI in the last 168 hours. ? ?BNP (last 3 results) ?No results  for input(s): PROBNP in the last 8760 hours. ? ?Lipid Profile: ?No results for input(s): CHOL, HDL, LDLCALC, TRIG, CHOLHDL, LDLDIRECT in the last 72 hours. ? ?Thyroid Function Tests: ?No results for input(s): TSH, T4TOTAL, FREET4, T3FREE, THYROIDAB in the last 72 hours. ? ?Anemia Panel: ?No results for input(s): VITAMINB12, FOLATE, FERRITIN, TIBC, IRON, RETICCTPCT in the last 72 hours. ? ?Urine analysis: ?   ?Component Value Date/Time  ? COLORURINE AMBER (A) 10/07/2021 2028  ? APPEARANCEUR HAZY (A) 10/07/2021 2028  ? LABSPEC 1.028 10/07/2021 2028  ? PHURINE 5.0 10/07/2021 2028  ? GLUCOSEU NEGATIVE 10/07/2021 2028  ? [place] NEGATIVE 10/07/2021 2028  ? [place] NEGATIVE 10/07/2021 2028  ? [place] NEGATIVE 10/07/2021 2028  ? [place] NEGATIVE 10/07/2021 2028  ? NITRITE NEGATIVE 10/07/2021 2028  ? LEUKOCYTESUR TRACE (A) 10/07/2021 2028  ? ? ?Sepsis Labs: ?Lactic Acid, Venous ?   ?Component Value Date/Time  ? LATICACIDVEN 0.9 10/08/2021 1741  ? ? ?MICROBIOLOGY: ?Recent Results (from the past 240 hour(s))  ?MRSA Next Gen by PCR, Nasal      Status: None  ? Collection Time: 10/03/21  9:28 AM  ? Specimen: Nasal Mucosa; Nasal Swab  ?Result Value Ref Range Status  ? MRSA by PCR Next Gen NOT DETECTED NOT DETECTED Final  ?  Comment: (NOTE) ?[employer]

## 2021-10-09 NOTE — Progress Notes (Signed)
Modified Barium Swallow Progress Note ? ?Patient Details  ?Name: Austin Conner ?MRN: [id number] ?Date of Birth: Aug 17, 1955 ? ?Today's Date: 10/09/2021 ? ?Modified Barium Swallow completed.  Full report located under Chart Review in the Imaging Section. ? ?Brief recommendations include the following: ? ?Clinical Impression ? Patient presents with a moderate oropharyngeal dysphagia as per this MBS. He exhibited weak lingual manipulation and movement of all boluses from anterior to posterior portion of oral cavity, mastication delay and premature spillage into vallecular sinus with liquids. He exhibited delayed swallow initiation to level of vallecular sinus with nectar thick liquids, puree and mechanical soft solids followed by mild-moderate vallecular residuals post initial swallow with puree solids, mild vallecular residuals with puree solids and trace vallecular residuals with thin and nectar thick liquids. Two instance of flash penetration (PAS 2) occured during the swallow with thin liquids but with no aspiration observed and full clearance of penetrate. Patient able to clear some of vallecular residuals with cued dry swallow, but full clearance never acheived during this test. Patient had reported to SLP that his epiglottis does not move well. (stated another SLP had told him that). Overall motility during pharyngeal phase was delayed but no significant stasis of barium, UES opening adequately and boluses transiting through esophagus with mild delayed transit. (seen during esohageal sweep. SLP is recommending initiate full liquids diet (thin liquid consistency) at this time and SLP will follow for toleration and ability to advance with solid textures. ?  ?Swallow Evaluation Recommendations ? ?   ? ? SLP Diet Recommendations: Thin liquid;Other (Comment) ? ? Liquid Administration via: Straw ? ? Medication Administration: Whole meds with liquid ? ? Supervision: Full assist for feeding ? ? Compensations: Slow  rate;Small sips/bites ? ? Postural Changes: Seated upright at 90 degrees ? ? Oral Care Recommendations: Oral care QID;Staff/trained caregiver to provide oral care ? ?   ? ?Sonia Baller, MA, CCC-SLP ?Speech Therapy ? ?
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
## 2021-10-09 NOTE — Progress Notes (Signed)
?   10/09/21 0834  ?Assess: MEWS Score  ?Temp 100 ?F (37.8 ?C)  ?BP (!) 167/64  ?Pulse Rate (!) 112  ?ECG Heart Rate (!) 112  ?Resp (!) 28  ?Level of Consciousness Alert  ?SpO2 94 %  ?O2 Device Nasal Cannula  ?O2 Flow Rate (L/min) 3 L/min  ?Assess: MEWS Score  ?MEWS Temp 0  ?MEWS Systolic 0  ?MEWS Pulse 2  ?MEWS RR 2  ?MEWS LOC 0  ?MEWS Score 4  ?MEWS Score Color Red  ?Assess: if the MEWS score is Yellow or Red  ?Were vital signs taken at a resting state? Yes  ?Focused Assessment No change from prior assessment  ?Does the patient meet 2 or more of the SIRS criteria? Yes ?(pt admitted with SIRS/aspiration PNA)  ?Early Detection of Sepsis Score *See Row Information* High  ?MEWS guidelines implemented *See Row Information* No, other (Comment) ?(Dr. Sloan Leiter aware, at bedside, pt is in no distress, PRN meds given, new orders placed.)  ?Treat  ?MEWS Interventions Administered scheduled meds/treatments;Administered prn meds/treatments;Other (Comment) ?(MD with new orders placed)  ?Pain Scale 0-10  ?Pain Score 0  ?Escalate  ?MEWS: Escalate  ?(No, patient has been previously RED within the last 24 hours)  ?Notify: Charge Nurse/RN  ?Name of Charge Nurse/RN Notified Carl Best RN  ?Date Charge Nurse/RN Notified 10/09/21  ?Time Charge Nurse/RN Notified [phone]  ?Notify: Provider  ?Provider Name/Title Dr. Sloan Leiter  ?Date Provider Notified 10/09/21  ?Time Provider Notified 0830  ?Notification Type Face-to-face  ?Notification Reason Other (Comment) ?(red MEWS)  ?Provider response At bedside  ?Date of Provider Response 10/09/21  ?Time of Provider Response 0830  ?Notify: Rapid Response  ?Name of Rapid Response RN Notified not applicable  ?Document  ?Patient Outcome Other (Comment) ?(Patient has been stable.  PRN meds given for temperature and BP.)  ?Progress note created (see row info) Yes  ? ? ?

## 2021-10-10 LAB — CBC WITH DIFFERENTIAL/PLATELET
Abs Immature Granulocytes: 0.16 10*3/uL — ABNORMAL HIGH (ref 0.00–0.07)
Basophils Absolute: 0 10*3/uL (ref 0.0–0.1)
Basophils Relative: 0 %
Eosinophils Absolute: 0.1 10*3/uL (ref 0.0–0.5)
Eosinophils Relative: 0 %
HCT: 30.8 % — ABNORMAL LOW (ref 39.0–52.0)
Hemoglobin: 10.6 g/dL — ABNORMAL LOW (ref 13.0–17.0)
Immature Granulocytes: 1 %
Lymphocytes Relative: 12 %
Lymphs Abs: 1.6 10*3/uL (ref 0.7–4.0)
MCH: 30.2 pg (ref 26.0–34.0)
MCHC: 34.4 g/dL (ref 30.0–36.0)
MCV: 87.7 fL (ref 80.0–100.0)
Monocytes Absolute: 1.5 10*3/uL — ABNORMAL HIGH (ref 0.1–1.0)
Monocytes Relative: 12 %
Neutro Abs: 9.8 10*3/uL — ABNORMAL HIGH (ref 1.7–7.7)
Neutrophils Relative %: 75 %
Platelets: 466 10*3/uL — ABNORMAL HIGH (ref 150–400)
RBC: 3.51 MIL/uL — ABNORMAL LOW (ref 4.22–5.81)
RDW: 13.2 % (ref 11.5–15.5)
WBC: 13.1 10*3/uL — ABNORMAL HIGH (ref 4.0–10.5)
nRBC: 0 % (ref 0.0–0.2)

## 2021-10-10 LAB — COMPREHENSIVE METABOLIC PANEL
ALT: 36 U/L (ref 0–44)
AST: 33 U/L (ref 15–41)
Albumin: 1.9 g/dL — ABNORMAL LOW (ref 3.5–5.0)
Alkaline Phosphatase: 73 U/L (ref 38–126)
Anion gap: 11 (ref 5–15)
BUN: 6 mg/dL — ABNORMAL LOW (ref 8–23)
CO2: 26 mmol/L (ref 22–32)
Calcium: 7.8 mg/dL — ABNORMAL LOW (ref 8.9–10.3)
Chloride: 100 mmol/L (ref 98–111)
Creatinine, Ser: 0.45 mg/dL — ABNORMAL LOW (ref 0.61–1.24)
GFR, Estimated: >60 mL/min (ref >60)
Glucose, Bld: 94 mg/dL (ref 70–99)
Potassium: 3.2 mmol/L — ABNORMAL LOW (ref 3.5–5.1)
Sodium: 137 mmol/L (ref 135–145)
Total Bilirubin: 1.1 mg/dL (ref 0.3–1.2)
Total Protein: 6.1 g/dL — ABNORMAL LOW (ref 6.5–8.1)

## 2021-10-10 LAB — BASIC METABOLIC PANEL
Anion gap: 10 (ref 5–15)
BUN: 7 mg/dL — ABNORMAL LOW (ref 8–23)
CO2: 26 mmol/L (ref 22–32)
Calcium: 8 mg/dL — ABNORMAL LOW (ref 8.9–10.3)
Chloride: 101 mmol/L (ref 98–111)
Creatinine, Ser: 0.53 mg/dL — ABNORMAL LOW (ref 0.61–1.24)
GFR, Estimated: >60 mL/min (ref >60)
Glucose, Bld: 95 mg/dL (ref 70–99)
Potassium: 3.5 mmol/L (ref 3.5–5.1)
Sodium: 137 mmol/L (ref 135–145)

## 2021-10-10 LAB — HEPATIC FUNCTION PANEL
ALT: 40 U/L (ref 0–44)
AST: 37 U/L (ref 15–41)
Albumin: 2 g/dL — ABNORMAL LOW (ref 3.5–5.0)
Alkaline Phosphatase: 68 U/L (ref 38–126)
Bilirubin, Direct: 0.3 mg/dL — ABNORMAL HIGH (ref 0.0–0.2)
Indirect Bilirubin: 0.9 mg/dL (ref 0.3–0.9)
Total Bilirubin: 1.2 mg/dL (ref 0.3–1.2)
Total Protein: 6 g/dL — ABNORMAL LOW (ref 6.5–8.1)

## 2021-10-10 LAB — CBC
HCT: 32 % — ABNORMAL LOW (ref 39.0–52.0)
Hemoglobin: 10.8 g/dL — ABNORMAL LOW (ref 13.0–17.0)
MCH: 29.4 pg (ref 26.0–34.0)
MCHC: 33.8 g/dL (ref 30.0–36.0)
MCV: 87.2 fL (ref 80.0–100.0)
Platelets: 451 10*3/uL — ABNORMAL HIGH (ref 150–400)
RBC: 3.67 MIL/uL — ABNORMAL LOW (ref 4.22–5.81)
RDW: 13.1 % (ref 11.5–15.5)
WBC: 13 10*3/uL — ABNORMAL HIGH (ref 4.0–10.5)
nRBC: 0 % (ref 0.0–0.2)

## 2021-10-10 MED ORDER — BENZONATATE 100 MG PO CAPS: 200.0000 mg | ORAL_CAPSULE | Freq: Three times a day (TID) | ORAL | Status: DC | PRN

## 2021-10-10 MED ORDER — DANTROLENE SODIUM 100 MG PO CAPS: 100.0000 mg | ORAL_CAPSULE | Freq: Two times a day (BID) | ORAL | Status: DC

## 2021-10-10 MED ORDER — HYDRALAZINE HCL 20 MG/ML IJ SOLN
5.0000 mg | INTRAMUSCULAR | Status: DC | PRN
  Administered 2021-10-16 – 2021-10-17 (×2): 5 mg via INTRAVENOUS
  Filled 2021-10-10 (×2): qty 1

## 2021-10-10 MED ORDER — LOSARTAN POTASSIUM 50 MG PO TABS: 100.0000 mg | ORAL_TABLET | Freq: Every day | ORAL | Status: DC

## 2021-10-10 MED ORDER — PROCHLORPERAZINE MALEATE 5 MG PO TABS: 5.0000 mg | ORAL_TABLET | Freq: Four times a day (QID) | ORAL | Status: DC | PRN

## 2021-10-10 MED ORDER — BISACODYL 10 MG RE SUPP: 10.0000 mg | Freq: Every day | RECTAL | Status: DC | PRN

## 2021-10-10 MED ORDER — ADULT MULTIVITAMIN W/MINERALS CH
1.0000 | ORAL_TABLET | Freq: Every day | ORAL | Status: DC
  Administered 2021-10-10 – 2021-10-18 (×9): 1 via ORAL
  Filled 2021-10-10 (×9): qty 1

## 2021-10-10 MED ORDER — FLEET ENEMA 7-19 GM/118ML RE ENEM: 1.0000 | ENEMA | Freq: Once | RECTAL | Status: DC | PRN

## 2021-10-10 MED ORDER — GUAIFENESIN-DM 100-10 MG/5ML PO SYRP: 5.0000 mL | ORAL_SOLUTION | Freq: Four times a day (QID) | ORAL | Status: DC | PRN

## 2021-10-10 MED ORDER — DULOXETINE HCL 60 MG PO CPEP: 60.0000 mg | ORAL_CAPSULE | Freq: Every day | ORAL | Status: DC

## 2021-10-10 MED ORDER — POLYETHYLENE GLYCOL 3350 17 G PO PACK: 17.0000 g | PACK | Freq: Every day | ORAL | Status: DC

## 2021-10-10 MED ORDER — PANTOPRAZOLE SODIUM 40 MG PO TBEC
40.0000 mg | DELAYED_RELEASE_TABLET | Freq: Two times a day (BID) | ORAL | Status: DC
Start: 2021-10-10 — End: 2021-10-10

## 2021-10-10 MED ORDER — ENOXAPARIN SODIUM 40 MG/0.4ML IJ SOSY: 40.0000 mg | PREFILLED_SYRINGE | INTRAMUSCULAR | Status: DC

## 2021-10-10 MED ORDER — CARBAMIDE PEROXIDE 6.5 % OT SOLN
5.0000 [drp] | Freq: Two times a day (BID) | OTIC | Status: AC
  Administered 2021-10-10 – 2021-10-14 (×9): 5 [drp] via OTIC
  Filled 2021-10-10: qty 15

## 2021-10-10 MED ORDER — ENSURE ENLIVE PO LIQD
237.0000 mL | Freq: Two times a day (BID) | ORAL | Status: DC
  Administered 2021-10-12 – 2021-10-18 (×10): 237 mL via ORAL

## 2021-10-10 MED ORDER — TIZANIDINE HCL 2 MG PO TABS
6.0000 mg | ORAL_TABLET | Freq: Three times a day (TID) | ORAL | Status: DC
  Administered 2021-10-10 – 2021-10-15 (×16): 6 mg via ORAL
  Filled 2021-10-10 (×18): qty 3

## 2021-10-10 MED ORDER — PIPERACILLIN-TAZOBACTAM 3.375 G IVPB
3.3750 g | Freq: Three times a day (TID) | INTRAVENOUS | Status: AC
  Administered 2021-10-10 – 2021-10-12 (×8): 3.375 g via INTRAVENOUS
  Filled 2021-10-10 (×10): qty 50

## 2021-10-10 MED ORDER — ONDANSETRON HCL 4 MG/2ML IJ SOLN: 4.0000 mg | Freq: Four times a day (QID) | INTRAMUSCULAR | Status: DC | PRN

## 2021-10-10 MED ORDER — ENOXAPARIN SODIUM 60 MG/0.6ML IJ SOSY
60.0000 mg | PREFILLED_SYRINGE | INTRAMUSCULAR | Status: DC
  Administered 2021-10-10 – 2021-10-17 (×8): 60 mg via SUBCUTANEOUS
  Filled 2021-10-10 (×8): qty 0.6

## 2021-10-10 MED ORDER — METOPROLOL SUCCINATE ER 50 MG PO TB24: 50.0000 mg | ORAL_TABLET | Freq: Two times a day (BID) | ORAL | Status: DC

## 2021-10-10 MED ORDER — SODIUM CHLORIDE 0.9% FLUSH
3.0000 mL | Freq: Two times a day (BID) | INTRAVENOUS | Status: DC
  Administered 2021-10-10 – 2021-10-17 (×11): 3 mL via INTRAVENOUS

## 2021-10-10 MED ORDER — ACETAMINOPHEN 325 MG PO TABS: 325.0000 mg | ORAL_TABLET | ORAL | Status: DC | PRN

## 2021-10-10 MED ORDER — DANTROLENE SODIUM 100 MG PO CAPS
100.0000 mg | ORAL_CAPSULE | Freq: Two times a day (BID) | ORAL | Status: DC
  Filled 2021-10-10 (×2): qty 1

## 2021-10-10 MED ORDER — TRAZODONE HCL 50 MG PO TABS: 25.0000 mg | ORAL_TABLET | Freq: Every evening | ORAL | Status: DC | PRN

## 2021-10-10 MED ORDER — MORPHINE SULFATE (PF) 2 MG/ML IV SOLN: 2.0000 mg | INTRAVENOUS | Status: DC | PRN

## 2021-10-10 MED ORDER — PROCHLORPERAZINE EDISYLATE 10 MG/2ML IJ SOLN: 5.0000 mg | Freq: Four times a day (QID) | INTRAMUSCULAR | Status: DC | PRN

## 2021-10-10 MED ORDER — ALBUTEROL SULFATE (2.5 MG/3ML) 0.083% IN NEBU: 2.5000 mg | INHALATION_SOLUTION | Freq: Four times a day (QID) | RESPIRATORY_TRACT | Status: DC | PRN

## 2021-10-10 MED ORDER — GUAIFENESIN ER 600 MG PO TB12: 1200.0000 mg | ORAL_TABLET | Freq: Two times a day (BID) | ORAL | Status: DC

## 2021-10-10 MED ORDER — POLYETHYLENE GLYCOL 3350 17 G PO PACK: 17.0000 g | PACK | Freq: Every day | ORAL | Status: DC | PRN

## 2021-10-10 MED ORDER — ACETAMINOPHEN 650 MG RE SUPP: 650.0000 mg | Freq: Four times a day (QID) | RECTAL | Status: DC | PRN

## 2021-10-10 MED ORDER — TIZANIDINE HCL 4 MG PO TABS: 6.0000 mg | ORAL_TABLET | Freq: Three times a day (TID) | ORAL | Status: DC

## 2021-10-10 MED ORDER — ALBUTEROL SULFATE (2.5 MG/3ML) 0.083% IN NEBU: 2.5000 mg | INHALATION_SOLUTION | RESPIRATORY_TRACT | Status: DC | PRN

## 2021-10-10 MED ORDER — PROCHLORPERAZINE 25 MG RE SUPP: 12.5000 mg | Freq: Four times a day (QID) | RECTAL | Status: DC | PRN

## 2021-10-10 MED ORDER — DIPHENHYDRAMINE HCL 12.5 MG/5ML PO ELIX: 12.5000 mg | ORAL_SOLUTION | Freq: Four times a day (QID) | ORAL | Status: DC | PRN

## 2021-10-10 MED ORDER — ADULT MULTIVITAMIN W/MINERALS CH: 1.0000 | ORAL_TABLET | Freq: Every day | ORAL | Status: DC

## 2021-10-10 MED ORDER — METOPROLOL SUCCINATE ER 50 MG PO TB24
50.0000 mg | ORAL_TABLET | Freq: Every day | ORAL | Status: DC
  Administered 2021-10-10 – 2021-10-15 (×6): 50 mg via ORAL
  Filled 2021-10-10 (×6): qty 1

## 2021-10-10 MED ORDER — VITAMIN B-12 1000 MCG PO TABS
1000.0000 ug | ORAL_TABLET | Freq: Every day | ORAL | Status: DC
Start: 2021-10-10 — End: 2021-10-10

## 2021-10-10 MED ORDER — ALUM & MAG HYDROXIDE-SIMETH 200-200-20 MG/5ML PO SUSP: 30.0000 mL | ORAL | Status: DC | PRN
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
## 2021-10-10 NOTE — Care Management Important Message (Signed)
Important Message ? ?Patient Details  ?Name: Austin Conner ?MRN: [id number] ?Date of Birth: 06/13/1956 ? ? ?Medicare Important Message Given:  Yes ? ? ? ? ?Monik Lins ?10/10/2021, 2:50 PM ?

## 2021-10-10 NOTE — TOC Initial Note (Signed)
Transition of Care (TOC) - Initial/Assessment Note  ? ? ?Patient Details  ?Name: Austin Conner ?MRN: [id number] ?Date of Birth: 08-08-1955 ? ?Transition of Care (TOC) CM/SW Contact:    ?Benard Halsted, LCSW ?Phone Number: ?10/10/2021, 5:39 PM ? ?Clinical Narrative:                 ?CSW received consult for possible SNF placement at time of discharge. CSW spoke with patient and his sister at bedside. Sister reported she is currently unable to care for patient at their home given patient?s current physical needs and fall risk as he used to be able to walk some. Patient expressed understanding of PT recommendation and is agreeable to SNF placement at time of discharge. Sister reports preference for [facility] and stated she toured yesterday. CSW discussed insurance authorization process and will provide Medicare SNF ratings list.  ? ?Update: CSW spoke with Tammy at [facility] and she has accepted patient; she will begin insurance authorization process. CSW updated sister.  ? ?Skilled Nursing Rehab Facilities-   [email or web address]   Ratings out of 5 possible   ?Name Address  Phone # Quality Care Staffing Health Inspection Overall  ?[facility] [address], [place] '5 5 2 4  '$ ?[facility]  [address], [place] [phone] '4 2 5 5  '$ ?[facility] [place], [place] '4 1 1 1  '$ ?[place], [place] '2 2 4 4  '$ ?[facility] [address] [place] '1 1 2 1  '$ ?[facility] [place] '2 1 4 3  '$ ?[facility] [address], [place] '5 2 2 3  '$ ?[facility] [address] [place] '5 2 2 3  '$ ?[facility] ([place]) [place] [phone] '5 1 2 2  '$ ?[facility] [address], [place] [phone] '4 1 1 1  '$ ?[facility] [address]  [place] [phone] '4 1 2 1  '$ ?[facility] [place] [phone] '4 1 1 1  '$ ?        ?[place], [place]      ?[facility] [place] '4 2 3 3  '$ ?[facility] [place], [place] '3 1 5 4  '$ ?[place], [place] [address] [place], [place] [phone] '2 1 1 1  '$ ?[facility] [address] [place] [phone] '2 2 3 3  '$ ?        ?[address] (no [facility]) [address], [place] [phone] '4 5 5 5  '$ ?[facility] (No Humana) [address], [place] [phone] '4 1 4 3  '$ ?[facility] (No UHC) [address], [place] [phone] '5 5 5 5  '$ ?[facility] [address] [place] [phone] '3 2 4 4  '$ ?[facility] [address] [place] [phone] '3 3 4 4  '$ ?[facility] [place] [address] [place] '1 1 2 1  '$ ?[facility] [address] [place] [phone] '2 1 1 1  '$ ?[facility] [place] '5 2 4 5  '$ ?[facility] [address] [place] '3 1 1 1  '$ ?[facility] [place] '2 1 2 1  '$ ?        ?[facility] [address] [place] '1 1 1 1  '$ ?  [place] [address] [place]  [phone] '2 4 2 2  '$ ?[facility] [place] [address] [place] [phone] '5 2 3 4  '$ ?[place] [address] [place] '2 1 1 1  '$ ?[place] (No Humana) [address], [place] '2 1 3 2  '$ ?[facility] [address], [place] [phone] '3 1 1 1  '$ ?        ?[facility], [place] '5 4 5 5  '$ ?[facility])  [address], [place] '2 2 3 3  '$ ?[facility]) [place], [place] '3 2 4 4  '$ ?[facility] [address], [place] '4 3 4 4   '$ ?[address] [place] '3 3 1 1  '$ ?[facility]) [address] [place] [phone] '2 2 4 4  '$ ? ? ? ?Expected Discharge Plan: [place] ?Barriers to Discharge: Insurance Authorization ? ? ?Patient Goals and CMS Choice ?Patient states their goals for this hospitalization and ongoing recovery are:: Rehab ?CMS Medicare.gov Compare Post Acute Care list provided to:: Patient Represenative (must comment) ?Choice offered to / list presented to : Sibling ? ?Expected Discharge Plan and Services ?Expected Discharge Plan: [place] ?In-house Referral: Clinical Social Work ?  ?Post Acute Care Choice: [place] ?Living arrangements for the past 2 months: [place] ?                ?  ?  ?  ?  ?  ?  ?  ?  ?  ?  ? ?Prior Living Arrangements/Services ?Living arrangements for the past 2 months: [place] ?Lives with:: Siblings ?Patient language and need for interpreter reviewed:: Yes ?Do you feel safe going back to the place where you live?: Yes      ?Need for Family Participation in Patient Care: Yes (Comment) ?Care giver support system in place?: Yes (comment) ?Current home services: DME ?Criminal Activity/Legal Involvement Pertinent to Current Situation/Hospitalization: No - Comment as needed ? ?Activities of Daily Living ?Home Assistive Devices/Equipment: Bedside commode/3-in-1, Hoyer Lift ?ADL Screening (condition at time of admission) ?Patient's cognitive ability adequate to safely complete daily activities?: Yes ?Is the patient deaf or have difficulty hearing?: No ?Does the patient have difficulty seeing, even when wearing glasses/contacts?: No ?Does the patient have difficulty concentrating, remembering, or making decisions?: No ?Patient able to express need for assistance with ADLs?: Yes ?Does the patient have difficulty dressing or bathing?: Yes ?Independently performs ADLs?: No ?Communication:  Independent ?Dressing (OT): Needs assistance ?Is this a change from baseline?: Pre-admission baseline ?Grooming: Needs assistance ?Is this a change from baseline?: Pre-admission baseline ?Feeding: Needs assistance ?Is this a change from baseline?: Pre-admission baseline ?Bathing: Needs assistance ?Is this a change from baseline?: Pre-admission baseline ?Toileting: Needs assistance ?Is this a change from baseline?: Pre-admission baseline ?In/Out Bed: Needs assistance ?Is this a change from baseline?: Pre-admission baseline ?Walks in Home: Needs assistance ?Is this a change from baseline?: Pre-admission baseline ?Does the patient have difficulty walking or climbing stairs?: Yes ?Weakness of Legs: Both ?Weakness of Arms/Hands: Both ? ?Permission Sought/Granted ?Permission sought to share information with : Facility Sport and exercise psychologist, Family Supports ?Permission granted to share information with : Yes, Verbal Permission Granted ? Share Information with NAME: [name] ? Permission granted to share info w AGENCY: SNFs ? Permission granted to share info w Relationship: Sister ?  Permission granted to share info w Contact Information: [phone] ? ?Emotional Assessment ?Appearance:: Appears stated age ?Attitude/Demeanor/Rapport: Gracious ?Affect (typically observed): Appropriate, Quiet ?Orientation: : Oriented to Self, Oriented to Place, Oriented to  Time, Oriented to Situation ?Alcohol / Substance Use: Not Applicable ?Psych Involvement: No (comment) ? ?Admission diagnosis:  Aspiration pneumonia ([place]) [J69.0] ?Pulmonary infiltrate [R91.8] ?Generalized weakness [R53.1] ?Low grade fever [R50.9] ?Debility [R53.81] ?Patient Active Problem List  ? Diagnosis Date Noted  ? Shortness of breath   ? Weakness   ? Obesity (BMI 30-39.9) 09/26/2021  ? Debility 09/26/2021  ? Aspiration pneumonia ([place]) 09/26/2021  ? Dysphagia 09/26/2021  ? Essential hypertension 09/26/2021  ? Hypokalemia 09/26/2021  ? Syncope 09/25/2021  ? ?PCP:   System, Provider Not In ?Pharmacy:   ?[facility] [address] [place] ?[place] [address] ?Phone: [phone]-

## 2021-10-10 NOTE — Progress Notes (Signed)
?      ?                 PROGRESS NOTE ? ?      ?PATIENT DETAILS ?Name: Austin Conner ?Age: 66 y.o. ?Sex: male ?Date of Birth: 02/14/1956 ?Admit Date: 10/07/2021 ?Admitting Physician Karmen Bongo, MD ?OEV:[id number], Provider Not In ? ?Brief Summary: ?Patient is a 66 y.o.  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  See below for further details. ? ?Significant events: ?3/2-3/14>> hospitalization for syncope/aspiration pneumonitis-discharged to SNF ?3/14>> presented back to the ED from SNF in a few hours post discharge-due to ongoing lethargy/weakness/fever-thought to have aspiration PNA.  Admitted to [facility]. ? ?Significant studies: ?3/14>> CXR: Bibasilar consolidation ?3/15>> CT abdomen/pelvis: Bilateral infiltrates-no acute process in the abdomen. ? ?Significant microbiology data: ?3/13>> COVID/influenza PCR: Negative ?3/14>> urine culture: Insignificant growth ?3/15>> blood culture: No growth ? ?Procedures: ?None ? ?Consults: ?None  ? ?Subjective: ?Lying comfortably in bed-not drooling today.  Afebrile-complaining about fullness in his ears-and asking to remove his earwax (explained to patient that he needs ENT follow-up in the outpatient setting) ? ?Objective: ?Vitals: ?Blood pressure 114/82, pulse (!) 105, temperature 99.2 ?F (37.3 ?C), temperature source Oral, resp. rate 20, height '6\' 4"'$  (1.93 m), weight 126.4 kg, SpO2 91 %.  ? ?Exam: ?Gen Exam:Alert awake-not in any distress.  Dysarthric speech. ?HEENT:atraumatic, normocephalic ?Chest: B/L clear to auscultation anteriorly ?CVS:S1S2 regular ?Abdomen:soft non tender, non distended ?Extremities:no edema ?Neurology: Chronic left-sided spastic hemiplegia ?Skin: no rash  ? ?Pertinent Labs/Radiology: ?CBC Latest Ref Rng & Units 10/10/2021 10/09/2021 10/07/2021  ?WBC 4.0 - 10.5 K/uL 13.0(H) 12.8(H)  13.1(H)  ?Hemoglobin 13.0 - 17.0 g/dL 10.8(L) 10.6(L) 13.2  ?Hematocrit 39.0 - 52.0 % 32.0(L) 32.0(L) 40.8  ?Platelets 150 - 400 K/uL 451(H) 446(H) 452(H)  ?  ?Lab Results  ?Component Value Date  ? NA 137 10/10/2021  ? K 3.5 10/10/2021  ? CL 101 10/10/2021  ? CO2 26 10/10/2021  ?  ?Assessment/Plan: ?SIRS due to aspiration pneumonia: SIRS physiology improving-continue Zosyn-afebrile today-all cultures are negative.  Appreciate SLP eval-has been started on a dysphagia 2 diet today.  Per my discussion with patient's sister yesterday-she is aware of this difficult situation-patient will remain at risk for further/future episodes of aspiration.   ? ?Note-sepsis ruled out. ? 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  Resume dantrolene and tizanidine. ? ?Minimally elevated transaminases/bilirubin: Unclear significance-better today.   ? ?HTN: BP relatively stable-we will switch back to usual dosing of metoprolol. ? ?GERD: Continue PPI ? ?BPH-s/p TURP 2012 ? ?History of aortic root dilatation: Stable for outpatient follow-up with PCP ? ?Obesity: ?Estimated body mass index is 33.92 kg/m? as calculated from the following: ?  Height as of this encounter: '6\' 4"'$  (1.93 m). ?  Weight as of this encounter: 126.4 kg.  ? ?Code status: ?  Code Status: Full Code  ? ?DVT Prophylaxis:  Prophylactic Lovenox ? ?Family Communication: Dayshon Roback [phone] updated over the phone 3/17 ? ? ?Disposition Plan: ?Status is: Inpatient ?Remains inpatient appropriate because: Ongoing aspiration pneumonia-febrile-not stable for discharge. ?  ?Planned Discharge Destination:Skilled nursing facility ? ? ?Diet: ?Diet Order   ? ?       ?  DIET DYS 2 Room service appropriate? Yes  with Assist; Fluid consistency: Thin   Diet effective now       ?  ? ?  ?  ? ?  ?  ? ? ?Antimicrobial agents: ?Anti-infectives (From admission, onward)  ? ? Start     Dose/Rate Route Frequency Ordered Stop  ? 10/08/21 2200  cefTRIAXone (ROCEPHIN) 2 g in sodium chloride 0.9 % 100 mL IVPB  Status:  Discontinued       ? 2 g ?200 mL/hr over 30 Minutes Intravenous Every 24 hours 10/08/21 1111 10/08/21 1515  ? 10/08/21 2200  azithromycin (ZITHROMAX) 500 mg in sodium chloride 0.9 % 250 mL IVPB  Status:  Discontinued       ? 500 mg ?250 mL/hr over 60 Minutes Intravenous Every 24 hours 10/08/21 1111 10/08/21 1839  ? 10/08/21 1530  piperacillin-tazobactam (ZOSYN) IVPB 3.375 g       ? 3.375 g ?12.5 mL/hr over 240 Minutes Intravenous Every 8 hours 10/08/21 1515 10/13/21 1359  ? 10/08/21 0000  cefdinir (OMNICEF) 300 MG capsule       ? 300 mg Oral 2 times daily 10/07/21 2323    ? 10/08/21 0000  azithromycin (ZITHROMAX Z-PAK) 250 MG tablet       ? 250 mg Oral Daily 10/07/21 2323 10/12/21 2359  ? 10/07/21 2245  cefTRIAXone (ROCEPHIN) 1 g in sodium chloride 0.9 % 100 mL IVPB       ? 1 g ?200 mL/hr over 30 Minutes Intravenous  Once 10/07/21 2234 10/08/21 0010  ? 10/07/21 2245  azithromycin (ZITHROMAX) 500 mg in sodium chloride 0.9 % 250 mL IVPB       ? 500 mg ?250 mL/hr over 60 Minutes Intravenous  Once 10/07/21 2234 10/08/21 0011  ? ?  ? ? ? ?MEDICATIONS: ?Scheduled Meds: ? enoxaparin (LOVENOX) injection  60 mg Subcutaneous Q24H  ? metoprolol tartrate  5 mg Intravenous Q6H  ? sodium chloride flush  3 mL Intravenous Q12H  ? ?Continuous Infusions: ? sodium chloride 75 mL/hr at 10/10/21 0300  ? piperacillin-tazobactam (ZOSYN)  IV 3.375 g (10/10/21 0515)  ? ?PRN Meds:.acetaminophen, albuterol, bisacodyl, hydrALAZINE, morphine injection, ondansetron (ZOFRAN) IV ? ? ?I have personally reviewed following labs and imaging studies ? ?LABORATORY DATA: ?CBC: ?Recent Labs  ?Lab 10/05/21 ?2426 10/06/21 ?0321 10/07/21 ?8341 10/07/21 ?2028 10/09/21 ?9622 10/10/21 ?0222  ?WBC 12.3*  14.5* 13.3* 13.1* 12.8* 13.0*  ?NEUTROABS 8.9*  --   --   --   --   --   ?HGB 11.6* 11.9* 11.9* 13.2 10.6* 10.8*  ?HCT 34.9* 36.0* 35.8* 40.8 32.0* 32.0*  ?MCV 88.1 87.8 87.7 92.1 88.9 87.2  ?PLT 332 360 403* 452* 446* 451*  ? ? ? ?Basic Metabolic Panel: ?Recent Labs  ?Lab 10/07/21 ?2028 10/09/21 ?0146 10/10/21 ?0222  ?NA 138 138 137  ?K 5.1 3.7 3.5  ?CL 101 100 101  ?CO2 '22 24 26  '$ ?GLUCOSE 106* 87 95  ?BUN 17 10 7*  ?CREATININE 0.66 0.48* 0.53*  ?CALCIUM 8.5* 8.0* 8.0*  ? ? ? ?GFR: ?Estimated Creatinine Clearance: 133.6 mL/min (A) (by C-G formula based on SCr of 0.53 mg/dL (L)). ? ?Liver Function Tests: ?Recent Labs  ?Lab 10/07/21 ?2028 10/10/21 ?0222  ?AST 72* 37  ?ALT 67* 40  ?ALKPHOS 91 68  ?BILITOT 3.0* 1.2  ?PROT 7.0 6.0*  ?ALBUMIN 2.3* 2.0*  ? ? ?No results for input(s): LIPASE, AMYLASE in the last 168 hours. ?No results for input(s): AMMONIA in the last 168 hours. ? ?Coagulation Profile: ?No  results for input(s): INR, PROTIME in the last 168 hours. ? ?Cardiac Enzymes: ?No results for input(s): CKTOTAL, CKMB, CKMBINDEX, TROPONINI in the last 168 hours. ? ?BNP (last 3 results) ?No results for input(s): PROBNP in the last 8760 hours. ? ?Lipid Profile: ?No results for input(s): CHOL, HDL, LDLCALC, TRIG, CHOLHDL, LDLDIRECT in the last 72 hours. ? ?Thyroid Function Tests: ?No results for input(s): TSH, T4TOTAL, FREET4, T3FREE, THYROIDAB in the last 72 hours. ? ?Anemia Panel: ?No results for input(s): VITAMINB12, FOLATE, FERRITIN, TIBC, IRON, RETICCTPCT in the last 72 hours. ? ?Urine analysis: ?   ?Component Value Date/Time  ? COLORURINE AMBER (A) 10/07/2021 2028  ? APPEARANCEUR HAZY (A) 10/07/2021 2028  ? LABSPEC 1.028 10/07/2021 2028  ? PHURINE 5.0 10/07/2021 2028  ? GLUCOSEU NEGATIVE 10/07/2021 2028  ? [place] NEGATIVE 10/07/2021 2028  ? [place] NEGATIVE 10/07/2021 2028  ? [place] NEGATIVE 10/07/2021 2028  ? [place] NEGATIVE 10/07/2021 2028  ? NITRITE NEGATIVE 10/07/2021 2028  ? LEUKOCYTESUR TRACE (A)  10/07/2021 2028  ? ? ?Sepsis Labs: ?Lactic Acid, Venous ?   ?Component Value Date/Time  ? LATICACIDVEN 0.9 10/08/2021 1741  ? ? ?MICROBIOLOGY: ?Recent Results (from the past 240 hour(s))  ?MRSA Next Gen by PCR, [place]

## 2021-10-10 NOTE — Progress Notes (Signed)
Speech Language Pathology Treatment: Dysphagia  ?Patient Details ?Name: Austin Conner ?MRN: [id number] ?DOB: 1956/03/14 ?Today's Date: 10/10/2021 ?Time: 1324-4010 ?SLP Time Calculation (min) (ACUTE ONLY): 15 min ? ?Assessment / Plan / Recommendation ?Clinical Impression ? Pt was seen for dysphagia treatment. He was alert and cooperative during the session. Pt communicated his needs and wants with reduced speech intelligibility. Pt was educated regarding the results of the modified barium swallow study, diet recommendations, and swallowing precautions. Video recording of the study was used to facilitate education and he verbalized understanding, but reinforcement will likely be necessary. He tolerated regular texture solids and thin liquids via straw without overt s/sx of aspiration. Mastication was prolonged and 2-3 swallows were noted with larger boluses. Pt's diet will be advanced to dysphagia 2 solids and thin liquids with full supervision during meals to ensure observance of swallowing precautions. SLP will continue to follow pt.   ?  ?HPI HPI: Patient is a 66 y.o. male with PMH: TBI with resultant left sided contractures, hemiplegia, spasticity, chronic dysarthria, dysphagia who was admitted from SNF for lethargy/weakness and fever and found to have ongoing aspiration pneumonitis. He was hospitalized at [facility] from 3/2 to 3/14 for syncope and aspiration PNA, then discharged to SNF. He presented back to hospital from SNF a few hours after discharge due to ongoing lethargy, weakness and fever and suspicion of aspiration PNA. CXR on 3/14 showed bibasilar consolidation. Patient noted with some drooling of saliva and febrile morning of 3/16 (100.8 degrees F). Patient has been NPO and SLP ordered to evaluated swallow function. Patient seen during previous hospital admit to [facility] by SLP and evaluated at bedside for swallow on 3/3 and 3/9 with recommendations for minced solids,  thin liquids and reflux precautions. ?  ?   ?SLP Plan ? Continue with current plan of care ? ?  ?  ?Recommendations for follow up therapy are one component of a multi-disciplinary discharge planning process, led by the attending physician.  Recommendations may be updated based on patient status, additional functional criteria and insurance authorization. ?  ? ?Recommendations  ?Diet recommendations: Dysphagia 2 (fine chop);Thin liquid ?Liquids provided via: Cup;Straw ?Medication Administration: Whole meds with liquid ?Supervision: Trained caregiver to feed patient;Full supervision/cueing for compensatory strategies ?Compensations: Slow rate;Small sips/bites;Follow solids with liquid ?Postural Changes and/or Swallow Maneuvers: Seated upright 90 degrees  ?   ?    ?   ? ? ? ? Oral Care Recommendations: Oral care BID ?Follow Up Recommendations: Skilled nursing-short term rehab (<3 hours/day) ?Assistance recommended at discharge: Frequent or constant Supervision/Assistance ?SLP Visit Diagnosis: Dysphagia, oropharyngeal phase (R13.12) ?Plan: Continue with current plan of care ? ? ? ? ?  ?  ?Sherron Mapp I. Hardin Negus, [place], CCC-SLP ?Acute Rehabilitation Services ?Office number [phone] ?Pager [phone] ? ? ?[place] Marshall ? ?10/10/2021, 11:50 AM ? ? ? ?

## 2021-10-10 NOTE — NC FL2 (Signed)
?[place] MEDICAID FL2 LEVEL OF CARE SCREENING TOOL  ?  ? ?IDENTIFICATION  ?Patient Name: ?Austin Conner Birthdate: [birth year] Sex: male Admission Date (Current Location): ?10/07/2021  ?[place] and [place] Number: ? [place] ?  Facility and Address:  ?[facility]. [facility], [place] [address], [place], [place] [address] ?     Provider Number: ?[id number]  ?Attending Physician Name and Address:  ?Jonetta Osgood, MD ? Relative Name and Phone Number:  ?Jim Lundin (sister) [phone] ?   ?Current Level of Care: ?Hospital Recommended Level of Care: ?[place] Prior Approval Number: ?  ? ?Date Approved/Denied: ?  PASRR Number: ?[id number] ? ?Discharge Plan: ?SNF ?  ? ?Current Diagnoses: ?Patient Active Problem List  ? Diagnosis Date Noted  ? Shortness of breath   ? Weakness   ? Obesity (BMI 30-39.9) 09/26/2021  ? Debility 09/26/2021  ? Aspiration pneumonia ([place]) 09/26/2021  ? Dysphagia 09/26/2021  ? Essential hypertension 09/26/2021  ? Hypokalemia 09/26/2021  ? Syncope 09/25/2021  ? ? ?Orientation RESPIRATION BLADDER Height & Weight   ?  ?Self, Time, Situation, Place ? O2 (Nasal Cannula 3L) Incontinent, External catheter Weight: 278 lb 10.6 oz (126.4 kg) ?Height:  '6\' 4"'$  (193 cm)  ?BEHAVIORAL SYMPTOMS/MOOD NEUROLOGICAL BOWEL NUTRITION STATUS  ?    Incontinent Diet (See dc summary)  ?AMBULATORY STATUS COMMUNICATION OF NEEDS Skin   ?Extensive Assist Verbally Normal ?  ?  ?  ?    ?     ?     ? ? ?Personal Care Assistance Level of Assistance  ?  Bathing Assistance: Maximum assistance ?Feeding assistance: Maximum assistance ?Dressing Assistance: Maximum assistance ?   ? ?Functional Limitations Info  ?Sight, Speech, Hearing Sight Info: Adequate ?Hearing Info: Adequate ?Speech Info: Adequate  ? ? ?SPECIAL CARE FACTORS FREQUENCY  ?PT (By licensed PT), OT (By licensed OT)   ?  ?PT Frequency: 5x per week ?OT Frequency: 5x per week ?  ?  ?  ?   ? ? ?Contractures Contractures Info: Not present   ? ? ?Additional Factors Info  ?Code Status, Allergies Code Status Info: Full Code ?Allergies Info: Ciprofloxacin ?  ?  ?  ?   ? ?Current Medications (10/10/2021):  This is the current hospital active medication list ?Current Facility-Administered Medications  ?Medication Dose Route Frequency Provider Last Rate Last Admin  ? 0.9 %  sodium chloride infusion   Intravenous Continuous Ghimire, Henreitta Leber, MD 10 mL/hr at 10/10/21 1410 Rate Change at 10/10/21 1410  ? acetaminophen (TYLENOL) suppository 650 mg  650 mg Rectal Q6H PRN Jonetta Osgood, MD      ? albuterol (PROVENTIL) (2.5 MG/3ML) 0.083% nebulizer solution 2.5 mg  2.5 mg Nebulization Q2H PRN Ghimire, Henreitta Leber, MD      ? bisacodyl (DULCOLAX) suppository 10 mg  10 mg Rectal Daily PRN Ghimire, Henreitta Leber, MD      ? carbamide peroxide (DEBROX) 6.5 % OTIC (EAR) solution 5 drop  5 drop Both EARS BID Ghimire, Henreitta Leber, MD      ? enoxaparin (LOVENOX) injection 60 mg  60 mg Subcutaneous Q24H Ghimire, Shanker M, MD      ? hydrALAZINE (APRESOLINE) injection 5 mg  5 mg Intravenous Q4H PRN Ghimire, Henreitta Leber, MD      ? metoprolol succinate (TOPROL-XL) 24 hr tablet 50 mg  50 mg Oral Daily Jonetta Osgood, MD   50 mg at 10/10/21 1511  ? morphine (PF) 2 MG/ML injection 2 mg  2 mg Intravenous Q2H PRN Jonetta Osgood, MD      ? ondansetron [facility]) injection 4 mg  4 mg Intravenous Q6H PRN Ghimire, Henreitta Leber, MD      ? piperacillin-tazobactam (ZOSYN) IVPB 3.375 g  3.375 g Intravenous Q8H Ghimire, Shanker M, MD 12.5 mL/hr at 10/10/21 1508 3.375 g at 10/10/21 1508  ? sodium chloride flush (NS) 0.9 % injection 3 mL  3 mL Intravenous Q12H Jonetta Osgood, MD   3 mL at 10/10/21 1408  ? tiZANidine (ZANAFLEX) tablet 6 mg  6 mg Oral TID Jonetta Osgood, MD      ? ? ? ?Discharge Medications: ?Please see discharge summary for a list of discharge medications. ? ?Relevant Imaging Results: ? ?Relevant Lab Results: ? ? ?Additional Information ?SS# [id number]. No immunizations  on file. ? ?Benard Halsted, LCSW ? ? ? ? ?

## 2021-10-11 LAB — BASIC METABOLIC PANEL
Anion gap: 10 (ref 5–15)
BUN: 5 mg/dL — ABNORMAL LOW (ref 8–23)
CO2: 27 mmol/L (ref 22–32)
Calcium: 8 mg/dL — ABNORMAL LOW (ref 8.9–10.3)
Chloride: 99 mmol/L (ref 98–111)
Creatinine, Ser: 0.38 mg/dL — ABNORMAL LOW (ref 0.61–1.24)
GFR, Estimated: >60 mL/min (ref >60)
Glucose, Bld: 107 mg/dL — ABNORMAL HIGH (ref 70–99)
Potassium: 3.2 mmol/L — ABNORMAL LOW (ref 3.5–5.1)
Sodium: 136 mmol/L (ref 135–145)

## 2021-10-11 MED ORDER — POTASSIUM CHLORIDE 20 MEQ PO PACK
40.0000 meq | PACK | Freq: Once | ORAL | Status: AC
  Administered 2021-10-11: 40 meq via ORAL
  Filled 2021-10-11: qty 2

## 2021-10-11 MED ORDER — POLYETHYLENE GLYCOL 3350 17 G PO PACK
17.0000 g | PACK | Freq: Every day | ORAL | Status: DC | PRN
  Administered 2021-10-12 – 2021-10-16 (×2): 17 g via ORAL
  Filled 2021-10-11 (×3): qty 1

## 2021-10-11 NOTE — Progress Notes (Addendum)
?      ?                 PROGRESS NOTE ? ?      ?PATIENT DETAILS ?Name: Austin Conner ?Age: 66 y.o. ?Sex: male ?Date of Birth: [birth year] ?Admit Date: 10/07/2021 ?Admitting Physician Karmen Bongo, MD ?UEK:[id number], Provider Not In ? ?Brief Summary: ?Patient is a 66 y.o.  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  See below for further details. ? ?Significant events: ?3/2-3/14>> hospitalization for syncope/aspiration pneumonitis-discharged to SNF ?3/14>> presented back to the ED from SNF in a few hours post discharge-due to ongoing lethargy/weakness/fever-thought to have aspiration PNA.  Admitted to [facility]. ? ?Significant studies: ?3/14>> CXR: Bibasilar consolidation ?3/15>> CT abdomen/pelvis: Bilateral infiltrates-no acute process in the abdomen. ? ?Significant microbiology data: ?3/13>> COVID/influenza PCR: Negative ?3/14>> urine culture: Insignificant growth ?3/15>> blood culture: No growth ? ?Procedures: ?None ? ?Consults: ?None  ? ?Subjective: ?Lying comfortably in bed-no major issues overnight.  Do not hear any gurgling/accumulation of secretions. ? ?Objective: ?Vitals: ?Blood pressure (!) 156/88, pulse 95, temperature 99.9 ?F (37.7 ?C), temperature source Axillary, resp. rate 17, height '6\' 4"'$  (1.93 m), weight 126.4 kg, SpO2 92 %.  ? ?Exam: ?Gen Exam:Alert awake-not in any distress ?HEENT:atraumatic, normocephalic ?Chest: B/L clear to auscultation anteriorly ?CVS:S1S2 regular ?Abdomen:soft non tender, non distended ?Extremities:no edema ?Neurology: Chronic left-sided spastic hemiplegia-unchanged. ?Skin: no rash  ? ?Pertinent Labs/Radiology: ?CBC Latest Ref Rng & Units 10/10/2021 10/10/2021 10/09/2021  ?WBC 4.0 - 10.5 K/uL 13.1(H) 13.0(H) 12.8(H)  ?Hemoglobin 13.0 - 17.0 g/dL 10.6(L) 10.8(L) 10.6(L)  ?Hematocrit 39.0 - 52.0 % 30.8(L) 32.0(L)  32.0(L)  ?Platelets 150 - 400 K/uL 466(H) 451(H) 446(H)  ?  ?Lab Results  ?Component Value Date  ? NA 136 10/11/2021  ? K 3.2 (L) 10/11/2021  ? CL 99 10/11/2021  ? CO2 27 10/11/2021  ?  ?Assessment/Plan: ?SIRS due to aspiration pneumonia: SIRS physiology has resolved-cultures continue to be negative-improving on IV Zosyn.  Appreciate SLP eval-stable on dysphagia 2 diet.  Sister-aware of risk of recurrent aspiration ? ?Note-sepsis ruled out. ? 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  Continue dantrolene and tizanidine. ? ?Minimally elevated transaminases/bilirubin: Unclear significance-has improved. ? ?HTN: BP relatively stable-continue metoprolol.  ? ?Hypokalemia: Replete and recheck. ? ?GERD: Continue PPI ? ?BPH-s/p TURP 2012 ? ?History of aortic root dilatation: Stable for outpatient follow-up with PCP ? ?Obesity: ?Estimated body mass index is 33.92 kg/m? as calculated from the following: ?  Height as of this encounter: '6\' 4"'$  (1.93 m). ?  Weight as of this encounter: 126.4 kg.  ? ?Code status: ?  Code Status: Full Code  ? ?DVT Prophylaxis:  Prophylactic Lovenox ? ?Family Communication: Damir Leung [phone] updated over the phone 3/17 ? ? ?Disposition Plan: ?Status is: Inpatient ?Remains inpatient appropriate because: Ongoing aspiration pneumonia-febrile-not stable for discharge. ?  ?Planned Discharge Destination:Skilled nursing facility ? ? ?Diet: ?Diet Order   ? ?       ?  DIET DYS 2 Room service appropriate? Yes; Fluid consistency: Thin  Diet effective now       ?  ? ?  ?  ? ?  ?  ? ? ?Antimicrobial agents: ?Anti-infectives (From admission, onward)  ? ? Start  Dose/Rate Route Frequency Ordered Stop  ? 10/10/21 1500  piperacillin-tazobactam (ZOSYN) IVPB 3.375 g        ? 3.375 g ?12.5 mL/hr over 240 Minutes Intravenous Every 8 hours 10/10/21 1405 10/15/21 1359  ? 10/08/21 2200  cefTRIAXone (ROCEPHIN) 2 g in sodium chloride 0.9 % 100 mL IVPB  Status:  Discontinued       ? 2 g ?200 mL/hr over 30 Minutes Intravenous Every 24 hours 10/08/21 1111 10/08/21 1515  ? 10/08/21 2200  azithromycin (ZITHROMAX) 500 mg in sodium chloride 0.9 % 250 mL IVPB  Status:  Discontinued       ? 500 mg ?250 mL/hr over 60 Minutes Intravenous Every 24 hours 10/08/21 1111 10/08/21 1839  ? 10/08/21 1530  piperacillin-tazobactam (ZOSYN) IVPB 3.375 g  Status:  Discontinued       ? 3.375 g ?12.5 mL/hr over 240 Minutes Intravenous Every 8 hours 10/08/21 1515 10/10/21 1400  ? 10/08/21 0000  cefdinir (OMNICEF) 300 MG capsule       ? 300 mg Oral 2 times daily 10/07/21 2323    ? 10/08/21 0000  azithromycin (ZITHROMAX Z-PAK) 250 MG tablet       ? 250 mg Oral Daily 10/07/21 2323 10/12/21 2359  ? 10/07/21 2245  cefTRIAXone (ROCEPHIN) 1 g in sodium chloride 0.9 % 100 mL IVPB       ? 1 g ?200 mL/hr over 30 Minutes Intravenous  Once 10/07/21 2234 10/08/21 0010  ? 10/07/21 2245  azithromycin (ZITHROMAX) 500 mg in sodium chloride 0.9 % 250 mL IVPB       ? 500 mg ?250 mL/hr over 60 Minutes Intravenous  Once 10/07/21 2234 10/08/21 0011  ? ?  ? ? ? ?MEDICATIONS: ?Scheduled Meds: ? carbamide peroxide  5 drop Both EARS BID  ? enoxaparin (LOVENOX) injection  60 mg Subcutaneous Q24H  ? feeding supplement  237 mL Oral BID BM  ? metoprolol succinate  50 mg Oral Daily  ? multivitamin with minerals  1 tablet Oral Daily  ? sodium chloride flush  3 mL Intravenous Q12H  ? tiZANidine  6 mg Oral TID  ? ?Continuous Infusions: ? sodium chloride 10 mL/hr at 10/11/21 0446  ? piperacillin-tazobactam (ZOSYN)  IV 3.375 g (10/11/21 7616)  ? ?PRN Meds:.acetaminophen, albuterol, bisacodyl, hydrALAZINE, morphine injection, ondansetron (ZOFRAN) IV ? ? ?I have personally reviewed following labs and imaging studies ? ?LABORATORY DATA: ?CBC: ?Recent Labs   ?Lab 10/05/21 ?0737 10/06/21 ?0321 10/07/21 ?1062 10/07/21 ?2028 10/09/21 ?6948 10/10/21 ?0222 10/10/21 ?1358  ?WBC 12.3*   < > 13.3* 13.1* 12.8* 13.0* 13.1*  ?NEUTROABS 8.9*  --   --   --   --   --  9.8*  ?HGB 11.6*   < > 11.9* 13.2 10.6* 10.8* 10.6*  ?HCT 34.9*   < > 35.8* 40.8 32.0* 32.0* 30.8*  ?MCV 88.1   < > 87.7 92.1 88.9 87.2 87.7  ?PLT 332   < > 403* 452* 446* 451* 466*  ? < > = values in this interval not displayed.  ? ? ? ?Basic Metabolic Panel: ?Recent Labs  ?Lab 10/07/21 ?2028 10/09/21 ?0146 10/10/21 ?0222 10/10/21 ?1358 10/11/21 ?5462  ?NA 138 138 137 137 136  ?K 5.1 3.7 3.5 3.2* 3.2*  ?CL 101 100 101 100 99  ?CO2 '22 24 26 26 27  '$ ?GLUCOSE 106* 87 95 94 107*  ?BUN 17 10 7* 6* 5*  ?CREATININE 0.66 0.48* 0.53* 0.45* 0.38*  ?CALCIUM 8.5* 8.0* 8.0* 7.8* 8.0*  ? ? ? ?  GFR: ?Estimated Creatinine Clearance: 133.6 mL/min (A) (by C-G formula based on SCr of 0.38 mg/dL (L)). ? ?Liver Function Tests: ?Recent Labs  ?Lab 10/07/21 ?2028 10/10/21 ?0222 10/10/21 ?1358  ?AST 72* 37 33  ?ALT 67* 40 36  ?ALKPHOS 91 68 73  ?BILITOT 3.0* 1.2 1.1  ?PROT 7.0 6.0* 6.1*  ?ALBUMIN 2.3* 2.0* 1.9*  ? ? ?No results for input(s): LIPASE, AMYLASE in the last 168 hours. ?No results for input(s): AMMONIA in the last 168 hours. ? ?Coagulation Profile: ?No results for input(s): INR, PROTIME in the last 168 hours. ? ?Cardiac Enzymes: ?No results for input(s): CKTOTAL, CKMB, CKMBINDEX, TROPONINI in the last 168 hours. ? ?BNP (last 3 results) ?No results for input(s): PROBNP in the last 8760 hours. ? ?Lipid Profile: ?No results for input(s): CHOL, HDL, LDLCALC, TRIG, CHOLHDL, LDLDIRECT in the last 72 hours. ? ?Thyroid Function Tests: ?No results for input(s): TSH, T4TOTAL, FREET4, T3FREE, THYROIDAB in the last 72 hours. ? ?Anemia Panel: ?No results for input(s): VITAMINB12, FOLATE, FERRITIN, TIBC, IRON, RETICCTPCT in the last 72 hours. ? ?Urine analysis: ?   ?Component Value Date/Time  ? COLORURINE AMBER (A) 10/07/2021 2028  ? APPEARANCEUR  HAZY (A) 10/07/2021 2028  ? LABSPEC 1.028 10/07/2021 2028  ? PHURINE 5.0 10/07/2021 2028  ? GLUCOSEU NEGATIVE 10/07/2021 2028  ? [place] NEGATIVE 10/07/2021 2028  ? [place] NEGATIVE 10/07/2021 2028  ?

## 2021-10-12 LAB — CBC
HCT: 30.3 % — ABNORMAL LOW (ref 39.0–52.0)
Hemoglobin: 9.9 g/dL — ABNORMAL LOW (ref 13.0–17.0)
MCH: 29 pg (ref 26.0–34.0)
MCHC: 32.7 g/dL (ref 30.0–36.0)
MCV: 88.9 fL (ref 80.0–100.0)
Platelets: 471 10*3/uL — ABNORMAL HIGH (ref 150–400)
RBC: 3.41 MIL/uL — ABNORMAL LOW (ref 4.22–5.81)
RDW: 13.2 % (ref 11.5–15.5)
WBC: 10.8 10*3/uL — ABNORMAL HIGH (ref 4.0–10.5)
nRBC: 0 % (ref 0.0–0.2)

## 2021-10-12 LAB — BASIC METABOLIC PANEL WITH GFR
Anion gap: 7 (ref 5–15)
BUN: 5 mg/dL — ABNORMAL LOW (ref 8–23)
CO2: 32 mmol/L (ref 22–32)
Calcium: 7.9 mg/dL — ABNORMAL LOW (ref 8.9–10.3)
Chloride: 97 mmol/L — ABNORMAL LOW (ref 98–111)
Creatinine, Ser: 0.48 mg/dL — ABNORMAL LOW (ref 0.61–1.24)
GFR, Estimated: >60 mL/min
Glucose, Bld: 111 mg/dL — ABNORMAL HIGH (ref 70–99)
Potassium: 3.4 mmol/L — ABNORMAL LOW (ref 3.5–5.1)
Sodium: 136 mmol/L (ref 135–145)

## 2021-10-12 LAB — MAGNESIUM: Magnesium: 1.7 mg/dL (ref 1.7–2.4)

## 2021-10-12 MED ORDER — POTASSIUM CHLORIDE 20 MEQ PO PACK
40.0000 meq | PACK | Freq: Four times a day (QID) | ORAL | Status: AC
  Administered 2021-10-12 (×2): 40 meq via ORAL
  Filled 2021-10-12 (×2): qty 2

## 2021-10-12 MED ORDER — MAGNESIUM SULFATE 2 GM/50ML IV SOLN
2.0000 g | Freq: Once | INTRAVENOUS | Status: AC
  Administered 2021-10-12: 2 g via INTRAVENOUS
  Filled 2021-10-12: qty 50

## 2021-10-12 NOTE — Progress Notes (Signed)
?      ?                 PROGRESS NOTE ? ?      ?PATIENT DETAILS ?Name: Austin Conner ?Age: 66 y.o. ?Sex: male ?Date of Birth: 01-06-1956 ?Admit Date: 10/07/2021 ?Admitting Physician Austin Bongo, MD ?OBS:[id number], Provider Not In ? ?Brief Summary: ?Patient is a 66 y.o.  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  See below for further details. ? ?Significant events: ?3/2-3/14>> hospitalization for syncope/aspiration pneumonitis-discharged to SNF ?3/14>> presented back to the ED from SNF in a few hours post discharge-due to ongoing lethargy/weakness/fever-thought to have aspiration PNA.  Admitted to [facility]. ? ?Significant studies: ?3/14>> CXR: Bibasilar consolidation ?3/15>> CT abdomen/pelvis: Bilateral infiltrates-no acute process in the abdomen. ? ?Significant microbiology data: ?3/13>> COVID/influenza PCR: Negative ?3/14>> urine culture: Insignificant growth ?3/15>> blood culture: No growth ? ?Procedures: ?None ? ?Consults: ?None  ? ?Subjective: ?Lying comfortably in bed-no major issues overnight.  Afebrile. ? ?Objective: ?Vitals: ?Blood pressure (!) 145/80, pulse 97, temperature 98.7 ?F (37.1 ?C), temperature source Oral, resp. rate 17, height '6\' 4"'$  (1.93 m), weight 126.4 kg, SpO2 91 %.  ? ?Exam: ?Gen Exam:Alert awake-not in any distress ?HEENT:atraumatic, normocephalic ?Chest: B/L clear to auscultation anteriorly-except for a few transmitted upper airway sounds. ?CVS:S1S2 regular ?Abdomen:soft non tender, non distended ?Extremities:no edema ?Neurology: Non focal ?Skin: no rash  ? ?Pertinent Labs/Radiology: ?CBC Latest Ref Rng & Units 10/12/2021 10/10/2021 10/10/2021  ?WBC 4.0 - 10.5 K/uL 10.8(H) 13.1(H) 13.0(H)  ?Hemoglobin 13.0 - 17.0 g/dL 9.9(L) 10.6(L) 10.8(L)  ?Hematocrit 39.0 - 52.0 % 30.3(L) 30.8(L) 32.0(L)  ?Platelets 150 - 400 K/uL 471(H)  466(H) 451(H)  ?  ?Lab Results  ?Component Value Date  ? NA 136 10/12/2021  ? K 3.4 (L) 10/12/2021  ? CL 97 (L) 10/12/2021  ? CO2 32 10/12/2021  ?  ?Assessment/Plan: ?SIRS due to aspiration pneumonia: Sepsis physiology has resolved-cultures negative so far-plan is to complete 5 days of empiric IV Zosyn.   Appreciate SLP eval-stable on dysphagia 2 diet.  Sister-aware of risk of recurrent aspiration ? ?Note-sepsis ruled out. ? 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  Continue dantrolene and tizanidine. ? ?Minimally elevated transaminases/bilirubin: Unclear significance-has improved. ? ?HTN: BP stable-continue metoprolol. ? ?Hypokalemia: Continue to replete and recheck. ? ?GERD: Continue PPI ? ?BPH-s/p TURP 2012 ? ?History of aortic root dilatation: Stable for outpatient follow-up with PCP ? ?Debility/deconditioning: Previously living with sister-he was a 1 person assist-now with recurrent hospitalizations/acute illness-he is requiring more assistance-recommendations are for SNF on discharge. ? ?Obesity: ?Estimated body mass index is 33.92 kg/m? as calculated from the following: ?  Height as of this encounter: '6\' 4"'$  (1.93 m). ?  Weight as of this encounter: 126.4 kg.  ? ?Code status: ?  Code Status: Full Code  ? ?DVT Prophylaxis:  Prophylactic Lovenox ? ?Family Communication: Austin Conner [phone] voicemail on 3/19 ? ? ?Disposition Plan: ?Status is: Inpatient ?Remains inpatient appropriate because: Aspiration pneumonia-recurrent-on IV antibiotics-probably stable for discharge in the next 1-2 days. ?  ?Planned Discharge Destination:Skilled nursing facility ? ? ?Diet: ?Diet Order   ? ?       ?  DIET DYS 2 Room service appropriate? Yes; Fluid consistency: Thin  Diet  effective  now       ?  ? ?  ?  ? ?  ?  ? ? ?Antimicrobial agents: ?Anti-infectives (From admission, onward)  ? ? Start     Dose/Rate Route Frequency Ordered Stop  ? 10/10/21 1500  piperacillin-tazobactam (ZOSYN) IVPB 3.375 g       ? 3.375 g ?12.5 mL/hr over 240 Minutes Intravenous Every 8 hours 10/10/21 1405 10/15/21 1359  ? 10/08/21 2200  cefTRIAXone (ROCEPHIN) 2 g in sodium chloride 0.9 % 100 mL IVPB  Status:  Discontinued       ? 2 g ?200 mL/hr over 30 Minutes Intravenous Every 24 hours 10/08/21 1111 10/08/21 1515  ? 10/08/21 2200  azithromycin (ZITHROMAX) 500 mg in sodium chloride 0.9 % 250 mL IVPB  Status:  Discontinued       ? 500 mg ?250 mL/hr over 60 Minutes Intravenous Every 24 hours 10/08/21 1111 10/08/21 1839  ? 10/08/21 1530  piperacillin-tazobactam (ZOSYN) IVPB 3.375 g  Status:  Discontinued       ? 3.375 g ?12.5 mL/hr over 240 Minutes Intravenous Every 8 hours 10/08/21 1515 10/10/21 1400  ? 10/08/21 0000  cefdinir (OMNICEF) 300 MG capsule       ? 300 mg Oral 2 times daily 10/07/21 2323    ? 10/08/21 0000  azithromycin (ZITHROMAX Z-PAK) 250 MG tablet       ? 250 mg Oral Daily 10/07/21 2323 10/12/21 2359  ? 10/07/21 2245  cefTRIAXone (ROCEPHIN) 1 g in sodium chloride 0.9 % 100 mL IVPB       ? 1 g ?200 mL/hr over 30 Minutes Intravenous  Once 10/07/21 2234 10/08/21 0010  ? 10/07/21 2245  azithromycin (ZITHROMAX) 500 mg in sodium chloride 0.9 % 250 mL IVPB       ? 500 mg ?250 mL/hr over 60 Minutes Intravenous  Once 10/07/21 2234 10/08/21 0011  ? ?  ? ? ? ?MEDICATIONS: ?Scheduled Meds: ? carbamide peroxide  5 drop Both EARS BID  ? enoxaparin (LOVENOX) injection  60 mg Subcutaneous Q24H  ? feeding supplement  237 mL Oral BID BM  ? metoprolol succinate  50 mg Oral Daily  ? multivitamin with minerals  1 tablet Oral Daily  ? potassium chloride  40 mEq Oral Q6H  ? sodium chloride flush  3 mL Intravenous Q12H  ? tiZANidine  6 mg Oral TID  ? ?Continuous Infusions: ? sodium chloride Stopped (10/12/21 0853)  ?  piperacillin-tazobactam (ZOSYN)  IV Stopped (10/12/21 1028)  ? ?PRN Meds:.acetaminophen, albuterol, bisacodyl, hydrALAZINE, morphine injection, ondansetron (ZOFRAN) IV, polyethylene glycol ? ? ?I have personally reviewed following labs and imaging studies ? ?LABORATORY DATA: ?CBC: ?Recent Labs  ?Lab 10/07/21 ?2028 10/09/21 ?3474 10/10/21 ?0222 10/10/21 ?1358 10/12/21 ?0324  ?WBC 13.1* 12.8* 13.0* 13.1* 10.8*  ?NEUTROABS  --   --   --  9.8*  --   ?HGB 13.2 10.6* 10.8* 10.6* 9.9*  ?HCT 40.8 32.0* 32.0* 30.8* 30.3*  ?MCV 92.1 88.9 87.2 87.7 88.9  ?PLT 452* 446* 451* 466* 471*  ? ? ? ?Basic Metabolic Panel: ?Recent Labs  ?Lab 10/09/21 ?0146 10/10/21 ?0222 10/10/21 ?1358 10/11/21 ?2595 10/12/21 ?0324  ?NA 138 137 137 136 136  ?K 3.7 3.5 3.2* 3.2* 3.4*  ?CL 100 101 100 99 97*  ?CO2 '24 26 26 27 '$ 32  ?GLUCOSE 87 95 94 107* 111*  ?BUN 10 7* 6* 5* 5*  ?CREATININE 0.48* 0.53* 0.45* 0.38* 0.48*  ?CALCIUM 8.0* 8.0* 7.8* 8.0* 7.9*  ?MG  --   --   --   --  1.7  ? ? ? ?GFR: ?Estimated Creatinine Clearance: 133.6 mL/min (A) (by C-G formula based on SCr of 0.48 mg/dL (L)). ? ?Liver Function Tests: ?Recent Labs  ?Lab 10/07/21 ?2028 10/10/21 ?0222 10/10/21 ?1358  ?AST 72* 37 33  ?ALT 67* 40 36  ?ALKPHOS 91 68 73  ?BILITOT 3.0* 1.2 1.1  ?PROT 7.0 6.0* 6.1*  ?ALBUMIN 2.3* 2.0* 1.9*  ? ? ?No results for input(s): LIPASE, AMYLASE in the last 168 hours. ?No results for input(s): AMMONIA in the last 168 hours. ? ?Coagulation Profile: ?No results for input(s): INR, PROTIME in the last 168 hours. ? ?Cardiac Enzymes: ?No results for input(s): CKTOTAL, CKMB, CKMBINDEX, TROPONINI in the last 168 hours. ? ?BNP (last 3 results) ?No results for input(s): PROBNP in the last 8760 hours. ? ?Lipid Profile: ?No results for input(s): CHOL, HDL, LDLCALC, TRIG, CHOLHDL, LDLDIRECT in the last 72 hours. ? ?Thyroid Function Tests: ?No results for input(s): TSH, T4TOTAL, FREET4, T3FREE, THYROIDAB in the last 72 hours. ? ?Anemia Panel: ?No results for input(s):  VITAMINB12, FOLATE, FERRITIN, TIBC, IRON, RETICCTPCT in the last 72 hours. ? ?Urine analysis: ?   ?Component Value Date/Time  ? COLORURINE AMBER (A) 10/07/2021 2028  ? APPEARANCEUR HAZY (A) 10/07/2021 2028  ? LABS

## 2021-10-13 LAB — MAGNESIUM: Magnesium: 1.9 mg/dL (ref 1.7–2.4)

## 2021-10-13 LAB — CULTURE, BLOOD (ROUTINE X 2)
Culture: NO GROWTH
Culture: NO GROWTH
Special Requests: ADEQUATE
Special Requests: ADEQUATE

## 2021-10-13 MED ORDER — AMOXICILLIN-POT CLAVULANATE 875-125 MG PO TABS
1.0000 | ORAL_TABLET | Freq: Two times a day (BID) | ORAL | Status: AC
Start: 2021-10-13 — End: 2021-10-14
  Administered 2021-10-13 – 2021-10-14 (×4): 1 via ORAL
  Filled 2021-10-13 (×4): qty 1

## 2021-10-13 NOTE — TOC Progression Note (Signed)
Transition of Care (TOC) - Progression Note  ? ? ?Patient Details  ?Name: Austin Conner ?MRN: [id number] ?Date of Birth: 06-Oct-1955 ? ?Transition of Care (TOC) CM/SW Contact  ?Benard Halsted, LCSW ?Phone Number: ?10/13/2021, 9:00 AM ? ?Clinical Narrative:    ?[employer] awaiting insurance approval.  ? ? ?Expected Discharge Plan: [place] ?Barriers to Discharge: Insurance Authorization ? ?Expected Discharge Plan and Services ?Expected Discharge Plan: [place] ?In-house Referral: Clinical Social Work ?  ?Post Acute Care Choice: [place] ?Living arrangements for the past 2 months: [place] ?                ?  ?  ?  ?  ?  ?  ?  ?  ?  ?  ? ? ?Social Determinants of Health (SDOH) Interventions ?  ? ?Readmission Risk Interventions ?No flowsheet data found. ? ?

## 2021-10-13 NOTE — Progress Notes (Addendum)
?      ?                 PROGRESS NOTE ? ?      ?PATIENT DETAILS ?Name: Austin Conner ?Age: 66 y.o. ?Sex: male ?Date of Birth: [birth year] ?Admit Date: 10/07/2021 ?Admitting Physician Karmen Bongo, MD ?TXH:FSFSEL, Provider Not In ? ?Brief Summary: ?Patient is a 66 y.o.  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  See below for further details. ? ?Significant events: ?3/2-3/14>> hospitalization for syncope/aspiration pneumonitis-discharged to SNF ?3/14>> presented back to the ED from SNF in a few hours post discharge-due to ongoing lethargy/weakness/fever-thought to have aspiration PNA.  Admitted to [facility]. ? ?Significant studies: ?3/14>> CXR: Bibasilar consolidation ?3/15>> CT abdomen/pelvis: Bilateral infiltrates-no acute process in the abdomen. ? ?Significant microbiology data: ?3/13>> COVID/influenza PCR: Negative ?3/14>> urine culture: Insignificant growth ?3/15>> blood culture: No growth ? ?Procedures: ?None ? ?Consults: ?None  ? ?Subjective: ?No major issues overnight-lying comfortably in bed. ? ?Objective: ?Vitals: ?Blood pressure (!) 157/71, pulse (!) 103, temperature 97.9 ?F (36.6 ?C), temperature source Oral, resp. rate (!) 26, height '6\' 4"'$  (1.93 m), weight 126.4 kg, SpO2 94 %.  ? ?Exam: ?Gen Exam:Alert awake-not in any distress ?HEENT:atraumatic, normocephalic ?Chest: Some transmitted upper airway sounds. ?CVS:S1S2 regular ?Abdomen:soft non tender, non distended ?Extremities:no edema ?Neurology: Chronic left-sided spastic hemiparesis. ?Skin: no rash  ? ?Pertinent Labs/Radiology: ?CBC Latest Ref Rng & Units 10/12/2021 10/10/2021 10/10/2021  ?WBC 4.0 - 10.5 K/uL 10.8(H) 13.1(H) 13.0(H)  ?Hemoglobin 13.0 - 17.0 g/dL 9.9(L) 10.6(L) 10.8(L)  ?Hematocrit 39.0 - 52.0 % 30.3(L) 30.8(L) 32.0(L)  ?Platelets 150 - 400 K/uL 471(H) 466(H) 451(H)  ?   ?Lab Results  ?Component Value Date  ? NA 136 10/12/2021  ? K 3.4 (L) 10/12/2021  ? CL 97 (L) 10/12/2021  ? CO2 32 10/12/2021  ?  ?Assessment/Plan: ?SIRS due to aspiration pneumonia: Sepsis physiology has resolved-mild low-grade fever this morning-cultures negative so far-completed 5 days of IV Zosyn-we will transition to 2 more days of Augmentin.    Appreciate SLP eval-stable on dysphagia 2 diet.  Sister-aware of risk of recurrent aspiration nursing staff aware to maintain aspiration precautions-keep head end of bed elevated at all times. ? ?Note-sepsis ruled out. ? 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  Continue dantrolene and tizanidine. ? ?Minimally elevated transaminases/bilirubin: Unclear significance-has improved. ? ?Cerumen impaction left> right: Per patient's sister at bedside on 3/20 this has been a ongoing issue even during his prior hospitalization.  Patient has been on Debrox for the past few days.  Patient complains that his hearing is somewhat muffled.  Sister wanted to see if his ears could be cleaned during this hospitalization-I spoke with ENT-Dr. Marcelline Deist over the phone on 3/20-unfortunately at this cannot be done at bedside-as they do not have the equipment.  Will need to be done in the outpatient setting. ? ?HTN: BP stable-continue metoprolol. ? ?Hypokalemia: Repleted ? ?GERD: Continue PPI ? ?BPH-s/p TURP 2012 ? ?History of aortic root dilatation: Stable for outpatient follow-up with PCP ? ?Debility/deconditioning: Previously living with sister-he was a 1 person assist-now with recurrent hospitalizations/acute illness-he is requiring more assistance-recommendations are for SNF on discharge. ? ?Obesity: ?Estimated body mass index is 33.92 kg/m? as calculated  from the following: ?  Height as of this encounter: '6\' 4"'$  (1.93 m). ?  Weight as of this encounter: 126.4 kg.  ? ?Code status: ?  Code Status: Full Code  ? ?DVT Prophylaxis:  Prophylactic Lovenox ? ?Family Communication: Dywane Peruski [phone] on 3/20 ? ? ?Disposition Plan: ?Status is: Inpatient ?Remains inpatient appropriate because: Aspiration pneumonia-recurrent-on IV antibiotics-probably stable for discharge in the next 1-2 days. ?  ?Planned Discharge Destination:Skilled nursing facility ? ? ?Diet: ?Diet Order   ? ?       ?  DIET DYS 2 Room service appropriate? Yes; Fluid consistency: Thin  Diet effective now       ?  ? ?  ?  ? ?  ?  ? ? ?Antimicrobial agents: ?Anti-infectives (From admission, onward)  ? ? Start     Dose/Rate Route Frequency Ordered Stop  ? 10/13/21 1000  amoxicillin-clavulanate (AUGMENTIN) 875-125 MG per tablet 1 tablet       ? 1 tablet Oral Every 12 hours 10/13/21 0727 10/15/21 0959  ? 10/10/21 1500  piperacillin-tazobactam (ZOSYN) IVPB 3.375 g       ? 3.375 g ?12.5 mL/hr over 240 Minutes Intravenous Every 8 hours 10/10/21 1405 10/12/21 2359  ? 10/08/21 2200  cefTRIAXone (ROCEPHIN) 2 g in sodium chloride 0.9 % 100 mL IVPB  Status:  Discontinued       ? 2 g ?200 mL/hr over 30 Minutes Intravenous Every 24 hours 10/08/21 1111 10/08/21 1515  ? 10/08/21 2200  azithromycin (ZITHROMAX) 500 mg in sodium chloride 0.9 % 250 mL IVPB  Status:  Discontinued       ? 500 mg ?250 mL/hr over 60 Minutes Intravenous Every 24 hours 10/08/21 1111 10/08/21 1839  ? 10/08/21 1530  piperacillin-tazobactam (ZOSYN) IVPB 3.375 g  Status:  Discontinued       ? 3.375 g ?12.5 mL/hr over 240 Minutes Intravenous Every 8 hours 10/08/21 1515 10/10/21 1400  ? 10/08/21 0000  cefdinir (OMNICEF) 300 MG capsule       ? 300 mg Oral 2 times daily 10/07/21 2323    ? 10/08/21 0000  azithromycin (ZITHROMAX Z-PAK) 250 MG tablet       ? 250 mg Oral Daily 10/07/21 2323 10/12/21 2359  ? 10/07/21 2245  cefTRIAXone (ROCEPHIN)  1 g in sodium chloride 0.9 % 100 mL IVPB       ? 1 g ?200 mL/hr over 30 Minutes Intravenous  Once 10/07/21 2234 10/08/21 0010  ? 10/07/21 2245  azithromycin (ZITHROMAX) 500 mg in sodium chloride 0.9 % 250 mL IVPB       ? 500 mg ?250 mL/hr over 60 Minutes Intravenous  Once 10/07/21 2234 10/08/21 0011  ? ?  ? ? ? ?MEDICATIONS: ?Scheduled Meds: ? amoxicillin-clavulanate  1 tablet Oral Q12H  ? carbamide peroxide  5 drop Both EARS BID  ? enoxaparin (LOVENOX) injection  60 mg Subcutaneous Q24H  ? feeding supplement  237 mL Oral BID BM  ? metoprolol succinate  50 mg Oral Daily  ? multivitamin with minerals  1 tablet Oral Daily  ? sodium chloride flush  3 mL Intravenous Q12H  ? tiZANidine  6 mg Oral TID  ? ?Continuous Infusions: ? sodium chloride Stopped (10/12/21 1513)  ? ?PRN Meds:.acetaminophen, albuterol, bisacodyl, hydrALAZINE, morphine injection, ondansetron (ZOFRAN) IV, polyethylene glycol ? ? ?I have personally reviewed following labs and imaging studies ? ?LABORATORY DATA: ?CBC: ?Recent Labs  ?Lab 10/07/21 ?2028 10/09/21 ?6333 10/10/21 ?0222 10/10/21 ?1358 10/12/21 ?0324  ?WBC 13.1* 12.8* 13.0* 13.1*  10.8*  ?NEUTROABS  --   --   --  9.8*  --   ?HGB 13.2 10.6* 10.8* 10.6* 9.9*  ?HCT 40.8 32.0* 32.0* 30.8* 30.3*  ?MCV 92.1 88.9 87.2 87.7 88.9  ?PLT 452* 446* 451* 466* 471*  ? ? ? ?Basic Metabolic Panel: ?Recent Labs  ?Lab 10/09/21 ?0146 10/10/21 ?0222 10/10/21 ?1358 10/11/21 ?0626 10/12/21 ?9485 10/13/21 ?0131  ?NA 138 137 137 136 136  --   ?K 3.7 3.5 3.2* 3.2* 3.4*  --   ?CL 100 101 100 99 97*  --   ?CO2 '24 26 26 27 '$ 32  --   ?GLUCOSE 87 95 94 107* 111*  --   ?BUN 10 7* 6* 5* 5*  --   ?CREATININE 0.48* 0.53* 0.45* 0.38* 0.48*  --   ?CALCIUM 8.0* 8.0* 7.8* 8.0* 7.9*  --   ?MG  --   --   --   --  1.7 1.9  ? ? ? ?GFR: ?Estimated Creatinine Clearance: 133.6 mL/min (A) (by C-G formula based on SCr of 0.48 mg/dL (L)). ? ?Liver Function Tests: ?Recent Labs  ?Lab 10/07/21 ?2028 10/10/21 ?0222 10/10/21 ?1358  ?AST 72* 37 33   ?ALT 67* 40 36  ?ALKPHOS 91 68 73  ?BILITOT 3.0* 1.2 1.1  ?PROT 7.0 6.0* 6.1*  ?ALBUMIN 2.3* 2.0* 1.9*  ? ? ?No results for input(s): LIPASE, AMYLASE in the last 168 hours. ?No results for input(s): AMMON

## 2021-10-13 NOTE — TOC Progression Note (Signed)
Transition of Care (TOC) - Progression Note  ? ? ?Patient Details  ?Name: Austin Conner ?MRN: [id number] ?Date of Birth: 27-Oct-1955 ? ?Transition of Care (TOC) CM/SW Contact  ?Benard Halsted, LCSW ?Phone Number: ?10/13/2021, 4:13 PM ? ?Clinical Narrative:    ?CSW answered patient's sister's questions regarding getting PCP. She will contact [facility] for an appointment.  ? ? ?Expected Discharge Plan: [place] ?Barriers to Discharge: Insurance Authorization ? ?Expected Discharge Plan and Services ?Expected Discharge Plan: [place] ?In-house Referral: Clinical Social Work ?  ?Post Acute Care Choice: [place] ?Living arrangements for the past 2 months: [place] ?                ?  ?  ?  ?  ?  ?  ?  ?  ?  ?  ? ? ?Social Determinants of Health (SDOH) Interventions ?  ? ?Readmission Risk Interventions ?No flowsheet data found. ? ?

## 2021-10-13 NOTE — Progress Notes (Signed)
Speech Language Pathology Treatment: Dysphagia  ?Patient Details ?Name: Austin Conner ?MRN: [id number] ?DOB: 10/05/1955 ?Today's Date: 10/13/2021 ?Time: 5573-2202 ?SLP Time Calculation (min) (ACUTE ONLY): 13 min ? ?Assessment / Plan / Recommendation ?Clinical Impression ? Pt and his sister both report that his current chopped diet is going well, although it is more modified than the diet he would typically consume, which consists of more bite-sized pieces. His sister says that he seems to be better on his finely chopped diet while he is more acutely deconditioned. She believes that she would most likely want to provide this diet even upon discharge. There was coughing noted during trials with SLP only once, although this was with a regular solid that had been broken into smaller pieces and therefore likely more dry than what is coming on his trays. They deny any coughing during meals. Will leave on current Dys 2 (finely chopped) diet for now. ?  ?HPI HPI: Patient is a 66 y.o. male with PMH: TBI with resultant left sided contractures, hemiplegia, spasticity, chronic dysarthria, dysphagia who was admitted from SNF for lethargy/weakness and fever and found to have ongoing aspiration pneumonitis. He was hospitalized at [facility] from 3/2 to 3/14 for syncope and aspiration PNA, then discharged to SNF. He presented back to hospital from SNF a few hours after discharge due to ongoing lethargy, weakness and fever and suspicion of aspiration PNA. CXR on 3/14 showed bibasilar consolidation. Patient noted with some drooling of saliva and febrile morning of 3/16 (100.8 degrees F). Patient has been NPO and SLP ordered to evaluated swallow function. Patient seen during previous hospital admit to [facility] by SLP and evaluated at bedside for swallow on 3/3 and 3/9 with recommendations for minced solids, thin liquids and reflux precautions. ?  ?   ?SLP Plan ? Continue with current plan of care ? ?  ?   ?Recommendations for follow up therapy are one component of a multi-disciplinary discharge planning process, led by the attending physician.  Recommendations may be updated based on patient status, additional functional criteria and insurance authorization. ?  ? ?Recommendations  ?Diet recommendations: Dysphagia 2 (fine chop);Thin liquid ?Liquids provided via: Cup;Straw ?Medication Administration: Whole meds with liquid ?Supervision: Trained caregiver to feed patient;Full supervision/cueing for compensatory strategies ?Compensations: Slow rate;Small sips/bites;Follow solids with liquid ?Postural Changes and/or Swallow Maneuvers: Seated upright 90 degrees  ?   ?    ?   ? ? ? ? Oral Care Recommendations: Oral care BID ?Follow Up Recommendations: Skilled nursing-short term rehab (<3 hours/day) ?Assistance recommended at discharge: Frequent or constant Supervision/Assistance ?SLP Visit Diagnosis: Dysphagia, oropharyngeal phase (R13.12) ?Plan: Continue with current plan of care ? ? ? ? ?  ?  ? ? ?Osie Bond., M.A. CCC-SLP ?Acute Rehabilitation Services ?Pager [phone] ?Office [phone] ? ? ?10/13/2021, 3:43 PM ?
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
## 2021-10-13 NOTE — Plan of Care (Signed)
?  Problem: Clinical Measurements: ?Goal: Ability to maintain a body temperature in the normal range will improve ?Outcome: Progressing ?  ?Problem: Education: ?Goal: Knowledge of General Education information will improve ?Description: Including pain rating scale, medication(s)/side effects and non-pharmacologic comfort measures ?Outcome: Progressing ?  ?Problem: Clinical Measurements: ?Goal: Ability to maintain clinical measurements within normal limits will improve ?Outcome: Progressing ?Goal: Cardiovascular complication will be avoided ?Outcome: Progressing ?  ?Problem: Coping: ?Goal: Level of anxiety will decrease ?Outcome: Progressing ?  ?Problem: Elimination: ?Goal: Will not experience complications related to urinary retention ?Outcome: Progressing ?  ?Problem: Pain Managment: ?Goal: General experience of comfort will improve ?Outcome: Progressing ?  ?Problem: Skin Integrity: ?Goal: Risk for impaired skin integrity will decrease ?Outcome: Progressing ?  ?Problem: Activity: ?Goal: Ability to tolerate increased activity will improve ?Outcome: Not Progressing ?  ?Problem: Respiratory: ?Goal: Ability to maintain adequate ventilation will improve ?Outcome: Not Progressing ?Goal: Ability to maintain a clear airway will improve ?Outcome: Not Progressing ?  ?Problem: Health Behavior/Discharge Planning: ?Goal: Ability to manage health-related needs will improve ?Outcome: Not Progressing ?  ?Problem: Clinical Measurements: ?Goal: Will remain free from infection ?Outcome: Not Progressing ?Goal: Diagnostic test results will improve ?Outcome: Not Progressing ?Goal: Respiratory complications will improve ?Outcome: Not Progressing ?  ?Problem: Activity: ?Goal: Risk for activity intolerance will decrease ?Outcome: Not Progressing ?  ?Problem: Nutrition: ?Goal: Adequate nutrition will be maintained ?Outcome: Not Progressing ?  ?Problem: Elimination: ?Goal: Will not experience complications related to bowel motility ?Outcome:  Not Progressing ?  ?

## 2021-10-14 LAB — BASIC METABOLIC PANEL
Anion gap: 16 — ABNORMAL HIGH (ref 5–15)
BUN: 7 mg/dL — ABNORMAL LOW (ref 8–23)
CO2: 25 mmol/L (ref 22–32)
Calcium: 8.2 mg/dL — ABNORMAL LOW (ref 8.9–10.3)
Chloride: 90 mmol/L — ABNORMAL LOW (ref 98–111)
Creatinine, Ser: 0.41 mg/dL — ABNORMAL LOW (ref 0.61–1.24)
GFR, Estimated: >60 mL/min (ref >60)
Glucose, Bld: 90 mg/dL (ref 70–99)
Potassium: 4.6 mmol/L (ref 3.5–5.1)
Sodium: 131 mmol/L — ABNORMAL LOW (ref 135–145)

## 2021-10-14 LAB — MAGNESIUM: Magnesium: 1.8 mg/dL (ref 1.7–2.4)
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
## 2021-10-15 MED ORDER — CARBAMIDE PEROXIDE 6.5 % OT SOLN
5.0000 [drp] | Freq: Two times a day (BID) | OTIC | Status: DC
  Administered 2021-10-15 – 2021-10-18 (×7): 5 [drp] via OTIC
  Filled 2021-10-15: qty 15

## 2021-10-15 MED ORDER — SODIUM CHLORIDE 0.9 % IV BOLUS
250.0000 mL | Freq: Once | INTRAVENOUS | Status: AC
  Administered 2021-10-15: 250 mL via INTRAVENOUS

## 2021-10-15 MED ORDER — METOPROLOL SUCCINATE ER 25 MG PO TB24
25.0000 mg | ORAL_TABLET | Freq: Every day | ORAL | Status: DC
  Administered 2021-10-16 – 2021-10-17 (×2): 25 mg via ORAL
  Filled 2021-10-15 (×2): qty 1

## 2021-10-15 NOTE — TOC Progression Note (Addendum)
Transition of Care (TOC) - Progression Note  ? ? ?Patient Details  ?Name: Austin Conner ?MRN: [id number] ?Date of Birth: 1955/08/11 ? ?Transition of Care (TOC) CM/SW Contact  ?Benard Halsted, LCSW ?Phone Number: ?10/15/2021, 1:20 PM ? ?Clinical Narrative:    ?CSW notified that insurance has denied SNF. CSW faxed in appeal ([id number]). [employer] CM contacted CSW; CSW answered her questions and stated she would have a decision soon.   ? ?CSW updated patient's sister and made her aware that she will need to plan to take patient home if appeal is denied.  ? ? ?Expected Discharge Plan: [place] ?Barriers to Discharge: Insurance Authorization ? ?Expected Discharge Plan and Services ?Expected Discharge Plan: [place] ?In-house Referral: Clinical Social Work ?  ?Post Acute Care Choice: [place] ?Living arrangements for the past 2 months: [place] ?                ?  ?  ?  ?  ?  ?  ?  ?  ?  ?  ? ? ?Social Determinants of Health (SDOH) Interventions ?  ? ?Readmission Risk Interventions ?   ? View : No data to display.  ?  ?  ?  ? ? ?
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
## 2021-10-15 NOTE — Progress Notes (Signed)
?      ?                 PROGRESS NOTE ? ?      ?PATIENT DETAILS ?Name: Austin Conner ?Age: 66 y.o. ?Sex: male ?Date of Birth: 10/25/1955 ?Admit Date: 10/07/2021 ?Admitting Physician Karmen Bongo, MD ?FXT:[id number], Provider Not In ? ?Brief Summary: ?Patient is a 66 y.o.  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  See below for further details. ? ?Significant events: ?3/2-3/14>> hospitalization for syncope/aspiration pneumonitis-discharged to SNF ?3/14>> presented back to the ED from SNF in a few hours post discharge-due to ongoing lethargy/weakness/fever-thought to have aspiration PNA.  Admitted to [facility]. ? ?Significant studies: ?3/14>> CXR: Bibasilar consolidation ?3/15>> CT abdomen/pelvis: Bilateral infiltrates-no acute process in the abdomen. ? ?Significant microbiology data: ?3/13>> COVID/influenza PCR: Negative ?3/14>> urine culture: Insignificant growth ?3/15>> blood culture: No growth ? ?Procedures: ?None ? ?Consults: ?None  ? ?Subjective: ? ?No significant events as discussed with staff, patient still reports he is feeling his ears are stuffed. ? ? ?Objective: ?Vitals: ?Blood pressure 111/68, pulse 82, temperature 98.2 ?F (36.8 ?C), temperature source Oral, resp. rate 18, height '6\' 4"'$  (1.93 m), weight 126.4 kg, SpO2 95 %.  ? ?Exam: ? ?Awake Alert, Oriented X 3, No new F.N deficits, Normal affect, frail and deconditioned ?Otoscope exam significant for earwax in bilateral ear canal ?Symmetrical Chest wall movement, Good air movement bilaterally, CTAB ?RRR,No Gallops,Rubs or new Murmurs, No Parasternal Heave ?+ve B.Sounds, Abd Soft, No tenderness, No rebound - guarding or rigidity. ?Neurology: Chronic spastic left-sided hemiparesis. ? ? ?Pertinent Labs/Radiology: ? ?  Latest Ref Rng & Units 10/12/2021  ?  3:24 AM 10/10/2021  ?  1:58 PM  10/10/2021  ?  2:22 AM  ?CBC  ?WBC 4.0 - 10.5 K/uL 10.8   13.1   13.0    ?Hemoglobin 13.0 - 17.0 g/dL 9.9   10.6   10.8    ?Hematocrit 39.0 - 52.0 % 30.3   30.8   32.0    ?Platelets 150 - 400 K/uL 471   466   451    ?  ?Lab Results  ?Component Value Date  ? NA 131 (L) 10/14/2021  ? K 4.6 10/14/2021  ? CL 90 (L) 10/14/2021  ? CO2 25 10/14/2021  ?  ?Assessment/Plan: ? ?SIRS due to aspiration pneumonia:  ?-Sepsis physiology has resolved-had low-grade fever yesterday but none today-nontoxic-appearing.  Previously on Zosyn-on Augmentin-which will be discontinued today-as he will complete 7 days of treatment.    Appreciate SLP eval-stable on dysphagia 2 diet.  Sister-aware of risk of recurrent aspiration nursing staff aware to maintain aspiration precautions-keep head end of bed elevated at all times. ? ?Note-sepsis ruled out. ? 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  Continue dantrolene and tizanidine. ? ?Minimally elevated transaminases/bilirubin: Unclear significance-has improved. ? ?Cerumen impaction left> right: Per patient's sister at bedside on 3/20 this has been a ongoing issue even during his prior hospitalization.  On exam he definitely has more cerumen in the left ear compared to the right.  Continue Debrox..  Sister wanted to see if his ears could be cleaned during this hospitalization-previoud MD  spoke with ENT-Dr. Marcelline Deist over the phone on 3/20-unfortunately at this cannot be done at bedside-as they  do not have the equipment.  Will need to be done in the outpatient setting. ? ?HTN: BP stable-continue metoprolol. ? ?Hypokalemia: Repleted ? ?GERD: Continue PPI ? ?BPH-s/p TURP 2012 ? ?History of aortic root dilatation: Stable for outpatient follow-up with  PCP ? ?Debility/deconditioning: Previously living with sister-he was a 1 person assist-now with recurrent hospitalizations/acute illness-he is requiring more assistance than baseline-recommendations are for SNF on discharge. ? ?Obesity: ?Estimated body mass index is 33.92 kg/m? as calculated from the following: ?  Height as of this encounter: '6\' 4"'$  (1.93 m). ?  Weight as of this encounter: 126.4 kg.  ? ?Code status: ?  Code Status: Full Code  ? ?DVT Prophylaxis:  Prophylactic Lovenox ? ?Family Communication: none at bedside ? ? ?Disposition Plan: ?Status is: Inpatient ?Remains inpatient appropriate because: Aspiration pneumonia-severe debility-awaiting SNF bed. ?  ?Planned Discharge Destination:Skilled nursing facility ? ? ?Diet: ?Diet Order   ? ?       ?  DIET DYS 2 Room service appropriate? Yes; Fluid consistency: Thin  Diet effective now       ?  ? ?  ?  ? ?  ?  ? ? ?Antimicrobial agents: ?Anti-infectives (From admission, onward)  ? ? Start     Dose/Rate Route Frequency Ordered Stop  ? 10/13/21 1000  amoxicillin-clavulanate (AUGMENTIN) 875-125 MG per tablet 1 tablet       ? 1 tablet Oral Every 12 hours 10/13/21 0727 10/14/21 2154  ? 10/10/21 1500  piperacillin-tazobactam (ZOSYN) IVPB 3.375 g       ? 3.375 g ?12.5 mL/hr over 240 Minutes Intravenous Every 8 hours 10/10/21 1405 10/12/21 2359  ? 10/08/21 2200  cefTRIAXone (ROCEPHIN) 2 g in sodium chloride 0.9 % 100 mL IVPB  Status:  Discontinued       ? 2 g ?200 mL/hr over 30 Minutes Intravenous Every 24 hours 10/08/21 1111 10/08/21 1515  ? 10/08/21 2200  azithromycin (ZITHROMAX) 500 mg in sodium chloride 0.9 % 250 mL IVPB  Status:  Discontinued       ? 500 mg ?250 mL/hr over 60 Minutes Intravenous Every 24 hours 10/08/21 1111 10/08/21 1839  ? 10/08/21 1530  piperacillin-tazobactam (ZOSYN) IVPB 3.375 g  Status:  Discontinued       ? 3.375 g ?12.5 mL/hr over 240 Minutes Intravenous Every 8 hours 10/08/21 1515 10/10/21 1400  ? 10/08/21 0000  cefdinir (OMNICEF) 300 MG  capsule       ? 300 mg Oral 2 times daily 10/07/21 2323    ? 10/08/21 0000  azithromycin (ZITHROMAX Z-PAK) 250 MG tablet       ? 250 mg Oral Daily 10/07/21 2323 10/12/21 2359  ? 10/07/21 2245  cefTRIAXone (ROCEPHIN) 1 g in sodium chloride 0.9 % 100 mL IVPB       ? 1 g ?200 mL/hr over 30 Minutes Intravenous  Once 10/07/21 2234 10/08/21 0010  ? 10/07/21 2245  azithromycin (ZITHROMAX) 500 mg in sodium chloride 0.9 % 250 mL IVPB       ? 500 mg ?250 mL/hr over 60 Minutes Intravenous  Once 10/07/21 2234 10/08/21 0011  ? ?  ? ? ? ?MEDICATIONS: ?Scheduled Meds: ? carbamide peroxide  5 drop Both EARS BID  ? enoxaparin (LOVENOX) injection  60 mg Subcutaneous Q24H  ? feeding supplement  237 mL Oral BID BM  ? metoprolol succinate  50 mg Oral Daily  ? multivitamin with minerals  1 tablet Oral Daily  ? sodium chloride flush  3 mL  Intravenous Q12H  ? tiZANidine  6 mg Oral TID  ? ?Continuous Infusions: ? sodium chloride Stopped (10/12/21 1513)  ? ?PRN Meds:.acetaminophen, albuterol, bisacodyl, hydrALAZINE, morphine injection, ondansetron (ZOFRAN) IV, polyethylene glycol ? ? ?I have personally reviewed following labs and imaging studies ? ?LABORATORY DATA: ?CBC: ?Recent Labs  ?Lab 10/09/21 ?0641 10/10/21 ?0222 10/10/21 ?1358 10/12/21 ?0324  ?WBC 12.8* 13.0* 13.1* 10.8*  ?NEUTROABS  --   --  9.8*  --   ?HGB 10.6* 10.8* 10.6* 9.9*  ?HCT 32.0* 32.0* 30.8* 30.3*  ?MCV 88.9 87.2 87.7 88.9  ?PLT 446* 451* 466* 471*  ? ? ?Basic Metabolic Panel: ?Recent Labs  ?Lab 10/10/21 ?0222 10/10/21 ?1358 10/11/21 ?7824 10/12/21 ?0324 10/13/21 ?0131 10/14/21 ?0321  ?NA 137 137 136 136  --  131*  ?K 3.5 3.2* 3.2* 3.4*  --  4.6  ?CL 101 100 99 97*  --  90*  ?CO2 '26 26 27 '$ 32  --  25  ?GLUCOSE 95 94 107* 111*  --  90  ?BUN 7* 6* 5* 5*  --  7*  ?CREATININE 0.53* 0.45* 0.38* 0.48*  --  0.41*  ?CALCIUM 8.0* 7.8* 8.0* 7.9*  --  8.2*  ?MG  --   --   --  1.7 1.9 1.8  ? ? ?GFR: ?Estimated Creatinine Clearance: 133.6 mL/min (A) (by C-G formula based on SCr of 0.41  mg/dL (L)). ? ?Liver Function Tests: ?Recent Labs  ?Lab 10/10/21 ?0222 10/10/21 ?1358  ?AST 37 33  ?ALT 40 36  ?ALKPHOS 68 73  ?BILITOT 1.2 1.1  ?PROT 6.0* 6.1*  ?ALBUMIN 2.0* 1.9*  ? ?No results for input(s): LIPASE, AMYLASE in

## 2021-10-15 NOTE — Plan of Care (Signed)

## 2021-10-15 NOTE — Progress Notes (Signed)
Speech Language Pathology Treatment: Dysphagia  ?Patient Details ?Name: Austin Conner ?MRN: [id number] ?DOB: 1955-09-03 ?Today's Date: 10/15/2021 ?Time: 1202-1224 ?SLP Time Calculation (min) (ACUTE ONLY): 22 min ? ?Assessment / Plan / Recommendation ?Clinical Impression ? Nursing reports no difficulties with am meal this date. Pt repositioned upright in bed and clinician administered bites/sips of dys 2/thin liquids for assessment of diet tolerance during meal time. Mildly prolonged mastication and oral prep prevalent with true dys 2 solids, although adequate oral clearance achieved and no overt s/sx exhibited. X1 brief overt cough noted with sip of thin liquid via straw across a series of trials. Educated pt in regards to compensatory strategies to reduce risk for aspiration, including: small bites/sips, slow rate, alternate bites with sips. Pt demonstrated understanding and agreement, even self-pacing sips of liquids. Recommend continue current diet with SLP to f/u for continued tolerance and education in swallow strategies with additional focus on aspiration precautions given hx of recurrent aspiration PNA. Will f/u.  ?  ?HPI HPI: Patient is a 66 y.o. male with PMH: TBI with resultant left sided contractures, hemiplegia, spasticity, chronic dysarthria, dysphagia who was admitted from SNF for lethargy/weakness and fever and found to have ongoing aspiration pneumonitis. He was hospitalized at [facility] from 3/2 to 3/14 for syncope and aspiration PNA, then discharged to SNF. He presented back to hospital from SNF a few hours after discharge due to ongoing lethargy, weakness and fever and suspicion of aspiration PNA. CXR on 3/14 showed bibasilar consolidation. Patient noted with some drooling of saliva and febrile morning of 3/16 (100.8 degrees F). Patient has been NPO and SLP ordered to evaluated swallow function. Patient seen during previous hospital admit to [facility] by SLP and  evaluated at bedside for swallow on 3/3 and 3/9 with recommendations for minced solids, thin liquids and reflux precautions. ?  ?   ?SLP Plan ? Continue with current plan of care ? ?  ?  ?Recommendations for follow up therapy are one component of a multi-disciplinary discharge planning process, led by the attending physician.  Recommendations may be updated based on patient status, additional functional criteria and insurance authorization. ?  ? ?Recommendations  ?Diet recommendations: Dysphagia 2 (fine chop);Thin liquid ?Liquids provided via: Cup;Straw ?Medication Administration: Whole meds with liquid ?Supervision: Full supervision/cueing for compensatory strategies;Trained caregiver to feed patient ?Compensations: Slow rate;Small sips/bites;Follow solids with liquid ?Postural Changes and/or Swallow Maneuvers: Seated upright 90 degrees;Upright 30-60 min after meal  ?   ?    ?   ? ? ? ? Oral Care Recommendations: Oral care BID ?Follow Up Recommendations: Skilled nursing-short term rehab (<3 hours/day) ?Assistance recommended at discharge: Frequent or constant Supervision/Assistance ?SLP Visit Diagnosis: Dysphagia, oropharyngeal phase (R13.12) ?Plan: Continue with current plan of care ? ? ? ? ?  ?  ? ?Austin Dense, MA, CCC-SLP ?Acute Rehabilitation Services ?Office Number: [phone] ? ?Austin Conner ? ?10/15/2021, 12:29 PM ?

## 2021-10-16 LAB — CBC
HCT: 32.7 % — ABNORMAL LOW (ref 39.0–52.0)
Hemoglobin: 11.1 g/dL — ABNORMAL LOW (ref 13.0–17.0)
MCH: 30 pg (ref 26.0–34.0)
MCHC: 33.9 g/dL (ref 30.0–36.0)
MCV: 88.4 fL (ref 80.0–100.0)
Platelets: 464 10*3/uL — ABNORMAL HIGH (ref 150–400)
RBC: 3.7 MIL/uL — ABNORMAL LOW (ref 4.22–5.81)
RDW: 13.1 % (ref 11.5–15.5)
WBC: 7.3 10*3/uL (ref 4.0–10.5)
nRBC: 0 % (ref 0.0–0.2)

## 2021-10-16 LAB — BASIC METABOLIC PANEL
Anion gap: 9 (ref 5–15)
BUN: <5 mg/dL — ABNORMAL LOW (ref 8–23)
CO2: 35 mmol/L — ABNORMAL HIGH (ref 22–32)
Calcium: 8.7 mg/dL — ABNORMAL LOW (ref 8.9–10.3)
Chloride: 92 mmol/L — ABNORMAL LOW (ref 98–111)
Creatinine, Ser: 0.37 mg/dL — ABNORMAL LOW (ref 0.61–1.24)
GFR, Estimated: >60 mL/min (ref >60)
Glucose, Bld: 101 mg/dL — ABNORMAL HIGH (ref 70–99)
Potassium: 3.8 mmol/L (ref 3.5–5.1)
Sodium: 136 mmol/L (ref 135–145)

## 2021-10-16 MED ORDER — TIZANIDINE HCL 4 MG PO TABS
6.0000 mg | ORAL_TABLET | Freq: Three times a day (TID) | ORAL | Status: DC
  Administered 2021-10-16 – 2021-10-18 (×6): 6 mg via ORAL
  Filled 2021-10-16 (×6): qty 2

## 2021-10-16 MED ORDER — DANTROLENE SODIUM 25 MG PO CAPS
50.0000 mg | ORAL_CAPSULE | Freq: Two times a day (BID) | ORAL | Status: DC
  Administered 2021-10-16 – 2021-10-18 (×5): 50 mg via ORAL
  Filled 2021-10-16 (×6): qty 2
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
## 2021-10-16 NOTE — TOC Progression Note (Addendum)
Transition of Care (TOC) - Progression Note  ? ? ?Patient Details  ?Name: Austin Conner ?MRN: [id number] ?Date of Birth: 1956/04/18 ? ?Transition of Care (TOC) CM/SW Contact  ?Benard Halsted, LCSW ?Phone Number: ?10/16/2021, 3:56 PM ? ?Clinical Narrative:    ?CSW provided update to patient's sister that insurance appeal is still pending. She is asking if [facility] will still be in network with [facility] which is what she is switching him to in April. CSW inquiring with [facility].  ? ?[facility] requires a Subscriber # to check which [name] does not have access to yet.  ? ? ?Expected Discharge Plan: [place] ?Barriers to Discharge: Insurance Authorization ? ?Expected Discharge Plan and Services ?Expected Discharge Plan: [place] ?In-house Referral: Clinical Social Work ?  ?Post Acute Care Choice: [place] ?Living arrangements for the past 2 months: [place] ?                ?  ?  ?  ?  ?  ?  ?  ?  ?  ?  ? ? ?Social Determinants of Health (SDOH) Interventions ?  ? ?Readmission Risk Interventions ?   ? View : No data to display.  ?  ?  ?  ? ? ?
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
## 2021-10-17 LAB — BASIC METABOLIC PANEL
Anion gap: 8 (ref 5–15)
BUN: <5 mg/dL — ABNORMAL LOW (ref 8–23)
CO2: 33 mmol/L — ABNORMAL HIGH (ref 22–32)
Calcium: 8.8 mg/dL — ABNORMAL LOW (ref 8.9–10.3)
Chloride: 95 mmol/L — ABNORMAL LOW (ref 98–111)
Creatinine, Ser: 0.38 mg/dL — ABNORMAL LOW (ref 0.61–1.24)
GFR, Estimated: >60 mL/min (ref >60)
Glucose, Bld: 124 mg/dL — ABNORMAL HIGH (ref 70–99)
Potassium: 3.6 mmol/L (ref 3.5–5.1)
Sodium: 136 mmol/L (ref 135–145)

## 2021-10-17 MED ORDER — METOPROLOL SUCCINATE ER 50 MG PO TB24
50.0000 mg | ORAL_TABLET | Freq: Every day | ORAL | Status: DC
  Administered 2021-10-18: 50 mg via ORAL
  Filled 2021-10-17: qty 1

## 2021-10-17 MED ORDER — ORAL CARE MOUTH RINSE
15.0000 mL | Freq: Two times a day (BID) | OROMUCOSAL | Status: DC
  Administered 2021-10-17 – 2021-10-18 (×3): 15 mL via OROMUCOSAL

## 2021-10-17 MED ORDER — POLYETHYLENE GLYCOL 3350 17 G PO PACK
17.0000 g | PACK | Freq: Once | ORAL | Status: AC
  Administered 2021-10-17: 17 g via ORAL
  Filled 2021-10-17: qty 1

## 2021-10-17 NOTE — TOC Progression Note (Signed)
Transition of Care (TOC) - Progression Note  ? ? ?Patient Details  ?Name: Austin Conner ?MRN: [id number] ?Date of Birth: 1955-12-20 ? ?Transition of Care (TOC) CM/SW Contact  ?Benard Halsted, LCSW ?Phone Number: ?10/17/2021, 5:16 PM ? ?Clinical Narrative:    ?4:45pm-CSW received approval for patient to go to [employer], [place] with [facility] (p. [id number]): ?Approved for 5 bed days, Approval# [id number], from 3/23-3/27. ? ?CSW spoke with Tammy at [facility] but she stated staff have left for the day so they can accept patient in the morning. CSW emailed her the authorization details. CSW updated patient's sister and MD. No COVID test needed. Patient will need PTAR for transport. ? ? ?Expected Discharge Plan: [place] ?Barriers to Discharge: Insurance Authorization ? ?Expected Discharge Plan and Services ?Expected Discharge Plan: [place] ?In-house Referral: Clinical Social Work ?  ?Post Acute Care Choice: [place] ?Living arrangements for the past 2 months: [place] ?                ?  ?  ?  ?  ?  ?  ?  ?  ?  ?  ? ? ?Social Determinants of Health (SDOH) Interventions ?  ? ?Readmission Risk Interventions ?   ? View : No data to display.  ?  ?  ?  ? ? ?

## 2021-10-17 NOTE — TOC Progression Note (Signed)
Transition of Care (TOC) - Progression Note  ? ? ?Patient Details  ?Name: LAYTHAN HAYTER ?MRN: [id number] ?Date of Birth: 1955-12-14 ? ?Transition of Care (TOC) CM/SW Contact  ?Benard Halsted, LCSW ?Phone Number: ?10/17/2021, 3:14 PM ? ?Clinical Narrative:    ?CSW left voicemail for [employer] CM to request appeal update.  ? ? ?Expected Discharge Plan: [place] ?Barriers to Discharge: Insurance Authorization ? ?Expected Discharge Plan and Services ?Expected Discharge Plan: [place] ?In-house Referral: Clinical Social Work ?  ?Post Acute Care Choice: [place] ?Living arrangements for the past 2 months: [place] ?                ?  ?  ?  ?  ?  ?  ?  ?  ?  ?  ? ? ?Social Determinants of Health (SDOH) Interventions ?  ? ?Readmission Risk Interventions ?   ? View : No data to display.  ?  ?  ?  ? ? ?

## 2021-10-17 NOTE — Progress Notes (Signed)
?      ?                 PROGRESS NOTE ? ?      ?PATIENT DETAILS ?Name: Austin Conner ?Age: 66 y.o. ?Sex: male ?Date of Birth: Apr 27, 1956 ?Admit Date: 10/07/2021 ?Admitting Physician Karmen Bongo, MD ?WOE:[id number], Provider Not In ? ?Brief Summary: ?Patient is a 66 y.o.  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  See below for further details. ? ?Significant events: ?3/2-3/14>> hospitalization for syncope/aspiration pneumonitis-discharged to SNF ?3/14>> presented back to the ED from SNF in a few hours post discharge-due to ongoing lethargy/weakness/fever-thought to have aspiration PNA.  Admitted to [facility]. ? ?Significant studies: ?3/14>> CXR: Bibasilar consolidation ?3/15>> CT abdomen/pelvis: Bilateral infiltrates-no acute process in the abdomen. ? ?Significant microbiology data: ?3/13>> COVID/influenza PCR: Negative ?3/14>> urine culture: Insignificant growth ?3/15>> blood culture: No growth ? ?Procedures: ?None ? ?Consults: ?None  ? ?Subjective: ? ?No significant events as discussed with staff. ? ? ?Objective: ?Vitals: ?Blood pressure (!) 168/97, pulse 89, temperature 99.2 ?F (37.3 ?C), temperature source Oral, resp. rate (!) 22, height '6\' 4"'$  (1.93 m), weight 126.4 kg, SpO2 96 %.  ? ?Exam: ? ?Awake Alert, Oriented X 3, frail. ?Symmetrical Chest wall movement, Good air movement bilaterally, CTAB ?RRR,No Gallops,Rubs or new Murmurs, No Parasternal Heave ?+ve B.Sounds, Abd Soft, No tenderness, No rebound - guarding or rigidity. ?Neurolyse diffuse weakness with chronic left-sided spasticity. ? ? ? ? ?Pertinent Labs/Radiology: ? ?  Latest Ref Rng & Units 10/16/2021  ?  7:43 AM 10/12/2021  ?  3:24 AM 10/10/2021  ?  1:58 PM  ?CBC  ?WBC 4.0 - 10.5 K/uL 7.3   10.8   13.1    ?Hemoglobin 13.0 - 17.0 g/dL 11.1   9.9   10.6    ?Hematocrit 39.0 - 52.0 % 32.7    30.3   30.8    ?Platelets 150 - 400 K/uL 464   471   466    ?  ?Lab Results  ?Component Value Date  ? NA 136 10/17/2021  ? K 3.6 10/17/2021  ? CL 95 (L) 10/17/2021  ? CO2 33 (H) 10/17/2021  ?  ?Assessment/Plan: ? ?SIRS due to aspiration pneumonia:  ?-Sepsis physiology has resolved- ?-  Previously on Zosyn-on Augmentin-which will be discontinued today-as he will complete 7 days of treatment.    Appreciate SLP eval-stable on dysphagia 2 diet.  Sister-aware of risk of recurrent aspiration nursing staff aware to maintain aspiration precautions-keep head end of bed elevated at all times. ? ?Note-sepsis ruled out. ? 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  Continue dantrolene and tizanidine.(I will resume dantrolene today at a low dose, tizanidine was held yesterday due to soft blood pressure, it is to be resumed today) ? ?Minimally elevated transaminases/bilirubin: Unclear significance-has improved. ? ?Cerumen impaction left> right: Per patient's sister at bedside on 3/20 this has been a ongoing issue even during his prior hospitalization.  On exam he definitely has more cerumen in the left ear compared to the right.  Continue Debrox..  Sister wanted to see if his ears could be cleaned during this hospitalization-previoud MD  spoke with ENT-Dr. Marcelline Deist over the phone on 3/20-unfortunately at this cannot be done at bedside-as they do not have  the equipment.  Will need to be done in the outpatient setting. ? ?HTN:  ?-Patient had one low reading for which his Toprol-XL was decreased by half, now his blood pressure remains persistently elevated I will resume him back on his home dose Toprol-XL 50 mg daily.   ? ?Hypokalemia: Repleted ? ?GERD: Continue PPI ? ?BPH-s/p TURP 2012 ? ?History of  aortic root dilatation: Stable for outpatient follow-up with PCP ? ?Debility/deconditioning: Previously living with sister-he was a 1 person assist-now with recurrent hospitalizations/acute illness-he is requiring more assistance than baseline-recommendations are for SNF on discharge.  I have discussed with the sister today,Patient lives with his sister. Requires significant assistance with ADLs, uses hospital bed and bedside commode, uses motorized wheelchair for ambulation. At baseline he feeds himself and can stand, take about 12 steps, per sister. Has history of chronic spasticity, one-person assist with transfers, so will need to get him back to his baseline can go back to live with his sister, awaiting insurance authorization. ? ?Obesity: ?Estimated body mass index is 33.92 kg/m? as calculated from the following: ?  Height as of this encounter: '6\' 4"'$  (1.93 m). ?  Weight as of this encounter: 126.4 kg.  ? ?Code status: ?  Code Status: Full Code  ? ?DVT Prophylaxis:  Prophylactic Lovenox ? ?Family Communication: none at bedside ? ? ?Disposition Plan: ?Status is: Inpatient ?Remains inpatient appropriate because: Aspiration pneumonia-severe debility-awaiting SNF bed. ?  ?Planned Discharge Destination:Skilled nursing facility ? ? ?Diet: ?Diet Order   ? ?       ?  DIET DYS 2 Room service appropriate? Yes; Fluid consistency: Thin  Diet effective now       ?  ? ?  ?  ? ?  ?  ? ? ?Antimicrobial agents: ?Anti-infectives (From admission, onward)  ? ? Start     Dose/Rate Route Frequency Ordered Stop  ? 10/13/21 1000  amoxicillin-clavulanate (AUGMENTIN) 875-125 MG per tablet 1 tablet       ? 1 tablet Oral Every 12 hours 10/13/21 0727 10/14/21 2154  ? 10/10/21 1500  piperacillin-tazobactam (ZOSYN) IVPB 3.375 g       ? 3.375 g ?12.5 mL/hr over 240 Minutes Intravenous Every 8 hours 10/10/21 1405 10/12/21 2359  ? 10/08/21 2200  cefTRIAXone (ROCEPHIN) 2 g in sodium chloride 0.9 % 100 mL IVPB  Status:  Discontinued       ? 2  g ?200 mL/hr over 30 Minutes Intravenous Every 24 hours 10/08/21 1111 10/08/21 1515  ? 10/08/21 2200  azithromycin (ZITHROMAX) 500 mg in sodium chloride 0.9 % 250 mL IVPB  Status:  Discontinued       ? 500 mg ?250 mL/hr over 60 Minutes Intravenous Every 24 hours 10/08/21 1111 10/08/21 1839  ? 10/08/21 1530  piperacillin-tazobactam (ZOSYN) IVPB 3.375 g  Status:  Discontinued       ? 3.375 g ?12.5 mL/hr over 240 Minutes Intravenous Every 8 hours 10/08/21 1515 10/10/21 1400  ? 10/08/21 0000  cefdinir (OMNICEF) 300 MG capsule       ? 300 mg Oral 2 times daily 10/07/21 2323    ? 10/08/21 0000  azithromycin (ZITHROMAX Z-PAK) 250 MG tablet       ? 250 mg Oral Daily 10/07/21 2323 10/12/21 2359  ? 10/07/21 2245  cefTRIAXone (ROCEPHIN) 1 g in sodium chloride 0.9 % 100 mL IVPB       ? 1 g ?200 mL/hr over 30 Minutes Intravenous  Once 10/07/21 2234 10/08/21 0010  ?  10/07/21 2245  azithromycin (ZITHROMAX) 500 mg in sodium chloride 0.9 % 250 mL IVPB       ? 500 mg ?250 mL/hr over 60 Minutes Intravenous  Once 10/07/21 2234 10/08/21 0011  ? ?  ? ? ? ?MEDICATIONS: ?Scheduled Meds: ? carbamide peroxide  5 drop Both EARS BID  ? dantrolene  50 mg Oral BID  ? enoxaparin (LOVENOX) injection  60 mg Subcutaneous Q24H  ? feeding supplement  237 mL Oral BID BM  ? mouth rinse  15 mL Mouth Rinse BID  ? metoprolol succinate  25 mg Oral Daily  ? multivitamin with minerals  1 tablet Oral Daily  ? sodium chloride flush  3 mL Intravenous Q12H  ? tiZANidine  6 mg Oral TID  ? ?Continuous Infusions: ? sodium chloride Stopped (10/13/21 1517)  ? ?PRN Meds:.acetaminophen, albuterol, bisacodyl, hydrALAZINE, ondansetron (ZOFRAN) IV, polyethylene glycol ? ? ?I have personally reviewed following labs and imaging studies ? ?LABORATORY DATA: ?CBC: ?Recent Labs  ?Lab 10/12/21 ?0324 10/16/21 ?2703  ?WBC 10.8* 7.3  ?HGB 9.9* 11.1*  ?HCT 30.3* 32.7*  ?MCV 88.9 88.4  ?PLT 471* 464*  ? ? ?Basic Metabolic Panel: ?Recent Labs  ?Lab 10/11/21 ?0742 10/12/21 ?0324  10/13/21 ?0131 10/14/21 ?0321 10/16/21 ?5009 10/17/21 ?1346  ?NA 136 136  --  131* 136 136  ?K 3.2* 3.4*  --  4.6 3.8 3.6  ?CL 99 97*  --  90* 92* 95*  ?CO2 27 32  --  25 35* 33*  ?GLUCOSE 107* 111*  --  90 101* 1

## 2021-10-18 MED ORDER — SENNOSIDES-DOCUSATE SODIUM 8.6-50 MG PO TABS
1.0000 | ORAL_TABLET | Freq: Two times a day (BID) | ORAL | Status: DC
Start: 2021-10-18 — End: 2021-11-21

## 2021-10-18 MED ORDER — CARBAMIDE PEROXIDE 6.5 % OT SOLN: 5.0000 [drp] | Freq: Two times a day (BID) | OTIC | 0 refills | Status: AC

## 2021-10-18 MED ORDER — POLYETHYLENE GLYCOL 3350 17 G PO PACK: 17.0000 g | PACK | Freq: Once | ORAL | Status: DC

## 2021-10-18 MED ORDER — ENSURE ENLIVE PO LIQD: 237.0000 mL | Freq: Two times a day (BID) | ORAL | 12 refills | Status: DC

## 2021-10-18 MED ORDER — DANTROLENE SODIUM 50 MG PO CAPS: 50.0000 mg | ORAL_CAPSULE | Freq: Two times a day (BID) | ORAL | Status: DC

## 2021-10-18 NOTE — TOC Transition Note (Signed)
Transition of Care (TOC) - CM/SW Discharge Note ? ? ?Patient Details  ?Name: Austin Conner ?MRN: [id number] ?Date of Birth: 1955/11/12 ? ?Transition of Care (TOC) CM/SW Contact:  ?Emeterio Reeve, LCSW ?Phone Number: ?10/18/2021, 12:53 PM ? ? ?Clinical Narrative:    ?  ?Per MD patient ready for DC to [facility]. RN, patient, patient's family, and facility notified of DC. Discharge Summary and FL2 sent to facility. DC packet on chart. Insurance Josem Kaufmann has been received. Ambulance transport requested for patient.  ?  ?RN to call report to [phone]. Pt will go to room 705. ? ?CSW will sign off for now as social work intervention is no longer needed. Please consult [place] again if new needs arise. ? ? ?Final next level of care: [place] ?Barriers to Discharge: Barriers Resolved ? ? ?Patient Goals and CMS Choice ?Patient states their goals for this hospitalization and ongoing recovery are:: Rehab ?CMS Medicare.gov Compare Post Acute Care list provided to:: Patient Represenative (must comment) ?Choice offered to / list presented to : Sibling ? ?Discharge Placement ?  ?           ?Patient chooses bed at: [facility] [place] ?Patient to be transferred to facility by: [facility] ?Name of family member notified: sister, [name] ?Patient and family notified of of transfer: 10/18/21 ? ?Discharge Plan and Services ?In-house Referral: Clinical Social Work ?  ?Post Acute Care Choice: [facility]          ?  ?  ?  ?  ?  ?  ?  ?  ?  ?  ? ?Social Determinants of Health (SDOH) Interventions ?  ? ? ?Readmission Risk Interventions ?   ? View : No data to display.  ?  ?  ?  ? ? ?Emeterio Reeve, LCSW ?Clinical Social Worker ? ? ? ?

## 2021-10-18 NOTE — Progress Notes (Signed)
[facility] in the unit to pick up patient.  Patient's sister [name] made aware.  Patient discharged to [facility]. ?

## 2021-10-18 NOTE — Progress Notes (Signed)
Report called to [employer], report given to [employer]. ?

## 2021-10-18 NOTE — Discharge Summary (Signed)
Physician Discharge Summary  ?Austin Conner QQV:[id number] DOB: [birth year] DOA: 10/07/2021 ? ?PCP: System, Provider Not In ? ?Admit date: 10/07/2021 ?Discharge date: 10/18/2021 ? ?Admitted From: SNF ?Disposition:  SNF ? ?Recommendations for Outpatient Follow-up:  ?Please keep encouraging to use incentive spirometer ?Check CBC, BMP and 3 days ?Patient will need outpatient follow-up with ENT to clean the wax in both ears ?Please keep encouraging patient to use incentive spirometer ?Patient currently will need assistance with feeding, but at baseline he is able to feed himself ?Please keep feeding patient always with Fully seated position with aspiration risk. ? ? ?Discharge Condition:Stable ?CODE STATUS:FULL ?Diet recommendation: Dysphagia 2 with thin liquids ? ?Brief/Interim Summary: ? ?Patient is a 66 y.o.  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  See below for further details. ?  ?Significant events: ?3/2-3/14>> hospitalization for syncope/aspiration pneumonitis-discharged to SNF ?3/14>> presented back to the ED from SNF in a few hours post discharge-due to ongoing lethargy/weakness/fever-thought to have aspiration PNA.  Admitted to [facility]. ?  ?Significant studies: ?3/14>> CXR: Bibasilar consolidation ?3/15>> CT abdomen/pelvis: Bilateral infiltrates-no acute process in the abdomen. ?  ?Significant microbiology data: ?3/13>> COVID/influenza PCR: Negative ?3/14>> urine culture: Insignificant growth ?3/15>> blood culture: No growth ?  ? ? ?Sepsis (POA) due to aspiration pneumonia:  ?-Sepsis physiology has resolved- ?-  Previously on Zosyn>> on Augmentin, he did complete 7 days of treatment.    Appreciate SLP eval-stable on dysphagia 2 diet.  Sister-aware of risk of recurrent aspiration nursing staff aware to maintain aspiration precautions-keep head  end of bed elevated at all times. ?-Please keep feeding patient always with Fully  seated positions with aspiration risk ?  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  Continue dantrolene and tizanidine. ?  ?Minimally elevated transaminases/bilirubin: Unclear significance-has improved. ?  ?Cerumen impaction left> right:  ?-Discussed with couple ENT physicians, unfortunately not been done at bedside as they do not have the appropriate equipment, this can be done as an outpatient at the ENT office, please schedule an outpatient appointment with ENT.  . ?  ?HTN:  ?-Continue with home dose Toprol-XL ?  ?Hypokalemia: Repleted ?  ?GERD: Continue PPI ?  ?BPH-s/p TURP 2012 ?  ?History of aortic root dilatation: Stable for outpatient follow-up with PCP ?  ?Debility/deconditioning: Previously living with sister-he was a 1 person assist-now with recurrent hospitalizations/acute illness-he is requiring more assistance than baseline-recommendations are for SNF on discharge.  I have discussed with the sister today,Patient lives with his sister. Requires significant assistance with ADLs, uses hospital bed and bedside commode, uses motorized wheelchair for ambulation. At baseline he feeds himself and can stand, take about 12 steps, per sister. Has history of chronic spasticity, one-person assist with transfers, so will need to get him back to his baseline can go back to live with his sister, transfer authorization was obtained late yesterday, he will be discharged to SNF today. ?  ?Obesity: ?Estimated body mass index is 33.92 kg/m? as calculated from the following: ?  Height as of this encounter: '6\' 4"'$  (1.93 m). ?  Weight as of this encounter: 126.4 kg.  ? ?Discharge Diagnoses:  ?Principal  Problem: ?  Aspiration pneumonia ([place]) ?Active Problems: ?  Weakness ?  Debility ?  Essential hypertension ?  Obesity (BMI 30-39.9) ?  Dysphagia ? ? ? ?Discharge Instructions ? ?Discharge Instructions   ? ? Discharge instructions   Complete by: As directed ?  ? Follow with Primary MD /SNF physician ? ?Get CBC, CMP, 2 view Chest X ray in 1 week. ? ? ?Activity: As tolerated with Full fall precautions use walker/cane & assistance as needed ? ? ?Disposition  SNF ? ? ?Diet: Dysphagia 2 with thin liquid ? ? ?On your next visit with your primary care physician please Get Medicines reviewed and adjusted. ? ? ?Please request your Prim.MD to go over all Hospital Tests and Procedure/Radiological results at the follow up, please get all Hospital records sent to your Prim MD by signing hospital release before you go home. ? ? ?If you experience worsening of your admission symptoms, develop shortness of breath, life threatening emergency, suicidal or homicidal thoughts you must seek medical attention immediately by calling 911 or calling your MD immediately  if symptoms less severe. ? ?You Must read complete instructions/literature along with all the possible adverse reactions/side effects for all the Medicines you take and that have been prescribed to you. Take any new Medicines after you have completely understood and accpet all the possible adverse reactions/side effects.  ? ?Do not drive when taking Pain medications.  ? ? ?Do not take more than prescribed Pain, Sleep and Anxiety Medications ? ?Special Instructions: If you have smoked or chewed Tobacco  in the last 2 yrs please stop smoking, stop any regular Alcohol  and or any Recreational drug use. ? ?Wear Seat belts while driving. ? ? ?Please note ? ?You were cared for by a hospitalist during your hospital stay. If you have any questions about your discharge medications or the care you received while you were in the hospital after you are discharged, you can call the unit  and asked to speak with the hospitalist on call if the hospitalist that took care of you is not available. Once you are discharged, your primary care physician will handle any further medical issues. Please note that NO REFILLS for any discharge medications will be authorized once you are discharged, as it is imperative that you return to your primary care physician (or establish a relationship with a primary care physician if you do not have one) for your aftercare needs so that they can reassess your need for medications and monitor your lab values.  ? Increase activity slowly   Complete by: As directed ?  ? ?  ? ?Allergies as of 10/18/2021   ? ?   Reactions  ? Ciprofloxacin Hives  ? Other reaction(s): neurological reaction  ? ?  ? ?  ?Medication List  ?  ? ?TAKE these medications   ? ?acetaminophen 325 MG tablet ?Commonly known as: Tylenol ?Take 2 tablets (650 mg total) by mouth every 6 (six) hours as needed for moderate pain. ?  ?albuterol 108 (90 Base) MCG/ACT inhaler ?Commonly known as: VENTOLIN HFA ?Inhale 2 puffs into the lungs every 4 (four) hours as needed for wheezing or shortness of breath. ?  ?albuterol (2.5 MG/3ML) 0.083% nebulizer solution ?Commonly known as: PROVENTIL ?Take 2.5 mg by nebulization 3 (three) times daily. ?  ?calcium carbonate 600 MG Tabs tablet ?Commonly known as: OS-CAL ?Take 600 mg by mouth daily with breakfast. ?  ?carbamide peroxide 6.5 % OTIC solution ?Commonly known as: DEBROX ?Place 5 drops into both ears 2 (two) times daily for 10 days. ?  ?Cranberry 250 MG Caps ?Take 250  mg by mouth 3 (three) times daily. ?  ?Daily Vites tablet ?Take 1 tablet by mouth daily. ?  ?dantrolene 50 MG capsule ?Commonly known as: DANTRIUM ?Take 1 capsule (50 mg total) by mouth 2 (two) times daily. ?What changed:  ?medication strength ?how much to take ?  ?DULoxetine 60 MG capsule ?Commonly known as: CYMBALTA ?Take 60 mg by mouth at bedtime. ?  ?feeding supplement Liqd ?Take 237 mLs by mouth 2 (two)  times daily between meals. ?  ?guaiFENesin 600 MG 12 hr tablet ?Commonly known as: [place] ?Take 600 mg by mouth 2 (two) times daily. ?  ?metoprolol succinate 50 MG 24 hr tablet ?Commonly known as: TOPROL-XL ?Rich Number

## 2021-10-21 ENCOUNTER — Ambulatory Visit: Payer: Medicare (Managed Care) | Admitting: Nurse Practitioner

## 2021-10-25 DIAGNOSIS — H6122 Impacted cerumen, left ear: Secondary | ICD-10-CM | POA: Diagnosis not present

## 2021-10-25 DIAGNOSIS — R1312 Dysphagia, oropharyngeal phase: Secondary | ICD-10-CM | POA: Diagnosis not present

## 2021-10-25 DIAGNOSIS — G8192 Hemiplegia, unspecified affecting left dominant side: Secondary | ICD-10-CM | POA: Diagnosis not present

## 2021-10-25 DIAGNOSIS — R1310 Dysphagia, unspecified: Secondary | ICD-10-CM | POA: Diagnosis not present

## 2021-10-25 DIAGNOSIS — S062X9A Diffuse traumatic brain injury with loss of consciousness of unspecified duration, initial encounter: Secondary | ICD-10-CM | POA: Diagnosis not present

## 2021-10-25 DIAGNOSIS — R4789 Other speech disturbances: Secondary | ICD-10-CM | POA: Diagnosis not present

## 2021-10-25 DIAGNOSIS — R2681 Unsteadiness on feet: Secondary | ICD-10-CM | POA: Diagnosis not present

## 2021-10-25 DIAGNOSIS — R634 Abnormal weight loss: Secondary | ICD-10-CM | POA: Diagnosis not present

## 2021-10-25 DIAGNOSIS — H6120 Impacted cerumen, unspecified ear: Secondary | ICD-10-CM | POA: Diagnosis not present

## 2021-10-25 DIAGNOSIS — K219 Gastro-esophageal reflux disease without esophagitis: Secondary | ICD-10-CM | POA: Diagnosis not present

## 2021-10-25 DIAGNOSIS — S062X9S Diffuse traumatic brain injury with loss of consciousness of unspecified duration, sequela: Secondary | ICD-10-CM | POA: Diagnosis not present

## 2021-10-25 DIAGNOSIS — N390 Urinary tract infection, site not specified: Secondary | ICD-10-CM | POA: Diagnosis not present

## 2021-10-25 DIAGNOSIS — J690 Pneumonitis due to inhalation of food and vomit: Secondary | ICD-10-CM | POA: Diagnosis not present

## 2021-10-25 DIAGNOSIS — G8190 Hemiplegia, unspecified affecting unspecified side: Secondary | ICD-10-CM | POA: Diagnosis not present

## 2021-10-25 DIAGNOSIS — N401 Enlarged prostate with lower urinary tract symptoms: Secondary | ICD-10-CM | POA: Diagnosis not present

## 2021-10-25 DIAGNOSIS — M6259 Muscle wasting and atrophy, not elsewhere classified, multiple sites: Secondary | ICD-10-CM | POA: Diagnosis not present

## 2021-10-25 DIAGNOSIS — F32A Depression, unspecified: Secondary | ICD-10-CM | POA: Diagnosis not present

## 2021-10-25 DIAGNOSIS — Z741 Need for assistance with personal care: Secondary | ICD-10-CM | POA: Diagnosis not present

## 2021-10-25 DIAGNOSIS — I10 Essential (primary) hypertension: Secondary | ICD-10-CM | POA: Diagnosis not present

## 2021-10-25 DIAGNOSIS — M24542 Contracture, left hand: Secondary | ICD-10-CM | POA: Diagnosis not present

## 2021-10-25 DIAGNOSIS — E669 Obesity, unspecified: Secondary | ICD-10-CM | POA: Diagnosis not present

## 2021-10-25 DIAGNOSIS — R5382 Chronic fatigue, unspecified: Secondary | ICD-10-CM | POA: Diagnosis not present

## 2021-10-25 DIAGNOSIS — J159 Unspecified bacterial pneumonia: Secondary | ICD-10-CM | POA: Diagnosis not present

## 2021-10-25 DIAGNOSIS — R059 Cough, unspecified: Secondary | ICD-10-CM | POA: Diagnosis not present

## 2021-10-25 DIAGNOSIS — G2581 Restless legs syndrome: Secondary | ICD-10-CM | POA: Diagnosis not present

## 2021-10-28 DIAGNOSIS — R5382 Chronic fatigue, unspecified: Secondary | ICD-10-CM | POA: Diagnosis not present

## 2021-10-28 DIAGNOSIS — H6120 Impacted cerumen, unspecified ear: Secondary | ICD-10-CM | POA: Diagnosis not present

## 2021-10-28 DIAGNOSIS — R1310 Dysphagia, unspecified: Secondary | ICD-10-CM | POA: Diagnosis not present

## 2021-10-28 DIAGNOSIS — G8192 Hemiplegia, unspecified affecting left dominant side: Secondary | ICD-10-CM | POA: Diagnosis not present

## 2021-10-29 DIAGNOSIS — R059 Cough, unspecified: Secondary | ICD-10-CM | POA: Diagnosis not present

## 2021-10-29 DIAGNOSIS — R1310 Dysphagia, unspecified: Secondary | ICD-10-CM | POA: Diagnosis not present

## 2021-10-29 DIAGNOSIS — G8192 Hemiplegia, unspecified affecting left dominant side: Secondary | ICD-10-CM | POA: Diagnosis not present

## 2021-10-30 DIAGNOSIS — G8192 Hemiplegia, unspecified affecting left dominant side: Secondary | ICD-10-CM | POA: Diagnosis not present

## 2021-10-30 DIAGNOSIS — N390 Urinary tract infection, site not specified: Secondary | ICD-10-CM | POA: Diagnosis not present

## 2021-10-30 DIAGNOSIS — R1310 Dysphagia, unspecified: Secondary | ICD-10-CM | POA: Diagnosis not present

## 2021-10-30 DIAGNOSIS — R059 Cough, unspecified: Secondary | ICD-10-CM | POA: Diagnosis not present

## 2021-11-03 DIAGNOSIS — N390 Urinary tract infection, site not specified: Secondary | ICD-10-CM | POA: Diagnosis not present

## 2021-11-03 DIAGNOSIS — R059 Cough, unspecified: Secondary | ICD-10-CM | POA: Diagnosis not present

## 2021-11-03 DIAGNOSIS — R1310 Dysphagia, unspecified: Secondary | ICD-10-CM | POA: Diagnosis not present

## 2021-11-03 DIAGNOSIS — I10 Essential (primary) hypertension: Secondary | ICD-10-CM | POA: Diagnosis not present

## 2021-11-04 DIAGNOSIS — H6122 Impacted cerumen, left ear: Secondary | ICD-10-CM | POA: Diagnosis not present

## 2021-11-04 DIAGNOSIS — I10 Essential (primary) hypertension: Secondary | ICD-10-CM | POA: Diagnosis not present

## 2021-11-04 DIAGNOSIS — S062X9A Diffuse traumatic brain injury with loss of consciousness of unspecified duration, initial encounter: Secondary | ICD-10-CM | POA: Diagnosis not present

## 2021-11-06 DIAGNOSIS — I10 Essential (primary) hypertension: Secondary | ICD-10-CM | POA: Diagnosis not present

## 2021-11-06 DIAGNOSIS — N390 Urinary tract infection, site not specified: Secondary | ICD-10-CM | POA: Diagnosis not present

## 2021-11-06 DIAGNOSIS — R634 Abnormal weight loss: Secondary | ICD-10-CM | POA: Diagnosis not present

## 2021-11-10 DIAGNOSIS — S062X9S Diffuse traumatic brain injury with loss of consciousness of unspecified duration, sequela: Secondary | ICD-10-CM | POA: Diagnosis not present

## 2021-11-10 DIAGNOSIS — I10 Essential (primary) hypertension: Secondary | ICD-10-CM | POA: Diagnosis not present

## 2021-11-11 DIAGNOSIS — S062X9S Diffuse traumatic brain injury with loss of consciousness of unspecified duration, sequela: Secondary | ICD-10-CM | POA: Diagnosis not present

## 2021-11-11 DIAGNOSIS — I10 Essential (primary) hypertension: Secondary | ICD-10-CM | POA: Diagnosis not present

## 2021-11-13 DIAGNOSIS — R1310 Dysphagia, unspecified: Secondary | ICD-10-CM | POA: Diagnosis not present

## 2021-11-13 DIAGNOSIS — J159 Unspecified bacterial pneumonia: Secondary | ICD-10-CM | POA: Diagnosis not present

## 2021-11-13 DIAGNOSIS — G8192 Hemiplegia, unspecified affecting left dominant side: Secondary | ICD-10-CM | POA: Diagnosis not present

## 2021-11-13 DIAGNOSIS — S062X9S Diffuse traumatic brain injury with loss of consciousness of unspecified duration, sequela: Secondary | ICD-10-CM | POA: Diagnosis not present

## 2021-11-14 ENCOUNTER — Telehealth: Payer: Self-pay | Admitting: *Deleted

## 2021-11-14 NOTE — Telephone Encounter (Signed)
[facility] will fax orders to [place], want pt to stay in communication with them.  ? ?[name] Number: [phone] Name: Austin Conner  ?

## 2021-11-14 NOTE — Telephone Encounter (Signed)
I am not sure what this means. Patient is not established with our office yet.  ?

## 2021-11-19 ENCOUNTER — Ambulatory Visit: Payer: Medicare (Managed Care) | Admitting: Nurse Practitioner

## 2021-11-19 DIAGNOSIS — N319 Neuromuscular dysfunction of bladder, unspecified: Secondary | ICD-10-CM | POA: Diagnosis not present

## 2021-11-19 DIAGNOSIS — Z8701 Personal history of pneumonia (recurrent): Secondary | ICD-10-CM | POA: Diagnosis not present

## 2021-11-19 DIAGNOSIS — G8250 Quadriplegia, unspecified: Secondary | ICD-10-CM | POA: Diagnosis not present

## 2021-11-19 DIAGNOSIS — R471 Dysarthria and anarthria: Secondary | ICD-10-CM | POA: Diagnosis not present

## 2021-11-19 DIAGNOSIS — N400 Enlarged prostate without lower urinary tract symptoms: Secondary | ICD-10-CM | POA: Diagnosis not present

## 2021-11-19 DIAGNOSIS — K219 Gastro-esophageal reflux disease without esophagitis: Secondary | ICD-10-CM | POA: Diagnosis not present

## 2021-11-19 DIAGNOSIS — G8194 Hemiplegia, unspecified affecting left nondominant side: Secondary | ICD-10-CM | POA: Diagnosis not present

## 2021-11-19 DIAGNOSIS — R1310 Dysphagia, unspecified: Secondary | ICD-10-CM | POA: Diagnosis not present

## 2021-11-19 DIAGNOSIS — I10 Essential (primary) hypertension: Secondary | ICD-10-CM | POA: Diagnosis not present

## 2021-11-19 DIAGNOSIS — H6122 Impacted cerumen, left ear: Secondary | ICD-10-CM | POA: Diagnosis not present

## 2021-11-19 DIAGNOSIS — Z9181 History of falling: Secondary | ICD-10-CM | POA: Diagnosis not present

## 2021-11-19 DIAGNOSIS — K592 Neurogenic bowel, not elsewhere classified: Secondary | ICD-10-CM | POA: Diagnosis not present

## 2021-11-21 ENCOUNTER — Telehealth: Payer: Self-pay

## 2021-11-21 ENCOUNTER — Ambulatory Visit (INDEPENDENT_AMBULATORY_CARE_PROVIDER_SITE_OTHER): Payer: Medicare Other | Admitting: Nurse Practitioner

## 2021-11-21 ENCOUNTER — Ambulatory Visit (INDEPENDENT_AMBULATORY_CARE_PROVIDER_SITE_OTHER)
Admission: RE | Admit: 2021-11-21 | Discharge: 2021-11-21 | Disposition: A | Discharge status: Home / Self Care | Payer: Medicare Other | Source: Ambulatory Visit | Attending: Nurse Practitioner | Admitting: Nurse Practitioner

## 2021-11-21 ENCOUNTER — Encounter: Payer: Self-pay | Admitting: Nurse Practitioner

## 2021-11-21 VITALS — BP 140/82 | HR 102 | Temp 97.2°F | Resp 14

## 2021-11-21 DIAGNOSIS — Z760 Encounter for issue of repeat prescription: Secondary | ICD-10-CM

## 2021-11-21 DIAGNOSIS — W19XXXA Unspecified fall, initial encounter: Secondary | ICD-10-CM | POA: Diagnosis not present

## 2021-11-21 DIAGNOSIS — I10 Essential (primary) hypertension: Secondary | ICD-10-CM

## 2021-11-21 DIAGNOSIS — R7989 Other specified abnormal findings of blood chemistry: Secondary | ICD-10-CM

## 2021-11-21 DIAGNOSIS — K219 Gastro-esophageal reflux disease without esophagitis: Secondary | ICD-10-CM

## 2021-11-21 DIAGNOSIS — J189 Pneumonia, unspecified organism: Secondary | ICD-10-CM | POA: Diagnosis not present

## 2021-11-21 DIAGNOSIS — Z743 Need for continuous supervision: Secondary | ICD-10-CM | POA: Diagnosis not present

## 2021-11-21 DIAGNOSIS — J690 Pneumonitis due to inhalation of food and vomit: Secondary | ICD-10-CM | POA: Diagnosis not present

## 2021-11-21 DIAGNOSIS — S069XAS Unspecified intracranial injury with loss of consciousness status unknown, sequela: Secondary | ICD-10-CM

## 2021-11-21 DIAGNOSIS — M6281 Muscle weakness (generalized): Secondary | ICD-10-CM | POA: Diagnosis not present

## 2021-11-21 DIAGNOSIS — R5381 Other malaise: Secondary | ICD-10-CM

## 2021-11-21 DIAGNOSIS — M62838 Other muscle spasm: Secondary | ICD-10-CM

## 2021-11-21 DIAGNOSIS — R531 Weakness: Secondary | ICD-10-CM | POA: Diagnosis not present

## 2021-11-21 DIAGNOSIS — E042 Nontoxic multinodular goiter: Secondary | ICD-10-CM

## 2021-11-21 DIAGNOSIS — R0602 Shortness of breath: Secondary | ICD-10-CM | POA: Diagnosis not present

## 2021-11-21 DIAGNOSIS — I69854 Hemiplegia and hemiparesis following other cerebrovascular disease affecting left non-dominant side: Secondary | ICD-10-CM | POA: Diagnosis not present

## 2021-11-21 DIAGNOSIS — R1312 Dysphagia, oropharyngeal phase: Secondary | ICD-10-CM

## 2021-11-21 LAB — COMPREHENSIVE METABOLIC PANEL
ALT: 15 U/L (ref 0–53)
AST: 20 U/L (ref 0–37)
Albumin: 3.3 g/dL — ABNORMAL LOW (ref 3.5–5.2)
Alkaline Phosphatase: 78 U/L (ref 39–117)
BUN: 8 mg/dL (ref 6–23)
CO2: 30 mEq/L (ref 19–32)
Calcium: 8.8 mg/dL (ref 8.4–10.5)
Chloride: 99 mEq/L (ref 96–112)
Creatinine, Ser: 0.28 mg/dL — ABNORMAL LOW (ref 0.40–1.50)
GFR: 127.77 mL/min (ref >60.00)
Glucose, Bld: 87 mg/dL (ref 70–99)
Potassium: 3.4 mEq/L — ABNORMAL LOW (ref 3.5–5.1)
Sodium: 143 mEq/L (ref 135–145)
Total Bilirubin: 0.5 mg/dL (ref 0.2–1.2)
Total Protein: 6.8 g/dL (ref 6.0–8.3)

## 2021-11-21 LAB — POCT URINALYSIS DIPSTICK
Bilirubin, UA: NEGATIVE
Blood, UA: POSITIVE
Glucose, UA: NEGATIVE
Ketones, UA: POSITIVE
Leukocytes, UA: NEGATIVE
Nitrite, UA: NEGATIVE
Protein, UA: POSITIVE — AB
Spec Grav, UA: 1.015 (ref 1.010–1.025)
Urobilinogen, UA: 0.2 E.U./dL
pH, UA: 5.5 (ref 5.0–8.0)

## 2021-11-21 LAB — LIPID PANEL
Cholesterol: 145 mg/dL (ref 0–200)
HDL: 31.6 mg/dL — ABNORMAL LOW (ref >39.00)
LDL Cholesterol: 96 mg/dL (ref 0–99)
NonHDL: 112.94
Total CHOL/HDL Ratio: 5
Triglycerides: 83 mg/dL (ref 0.0–149.0)
VLDL: 16.6 mg/dL (ref 0.0–40.0)

## 2021-11-21 LAB — CBC
HCT: 37.5 % — ABNORMAL LOW (ref 39.0–52.0)
Hemoglobin: 12 g/dL — ABNORMAL LOW (ref 13.0–17.0)
MCHC: 32 g/dL (ref 30.0–36.0)
MCV: 85.8 fl (ref 78.0–100.0)
Platelets: 396 10*3/uL (ref 150.0–400.0)
RBC: 4.37 Mil/uL (ref 4.22–5.81)
RDW: 14.8 % (ref 11.5–15.5)
WBC: 9.2 10*3/uL (ref 4.0–10.5)

## 2021-11-21 LAB — IBC + FERRITIN
Ferritin: 436.3 ng/mL — ABNORMAL HIGH (ref 22.0–322.0)
Iron: 25 ug/dL — ABNORMAL LOW (ref 42–165)
Saturation Ratios: 14.3 % — ABNORMAL LOW (ref 20.0–50.0)
TIBC: 175 ug/dL — ABNORMAL LOW (ref 250.0–450.0)
Transferrin: 125 mg/dL — ABNORMAL LOW (ref 212.0–360.0)

## 2021-11-21 LAB — VITAMIN B12: Vitamin B-12: >1504 pg/mL — ABNORMAL HIGH (ref 211–911)

## 2021-11-21 LAB — FOLATE: Folate: 21 ng/mL (ref >5.9)

## 2021-11-21 MED ORDER — DULOXETINE HCL 60 MG PO CPEP: 60.0000 mg | ORAL_CAPSULE | Freq: Every day | ORAL | 1 refills | Status: DC

## 2021-11-21 MED ORDER — PANTOPRAZOLE SODIUM 20 MG PO TBEC: 20.0000 mg | DELAYED_RELEASE_TABLET | Freq: Two times a day (BID) | ORAL | 1 refills | Status: DC

## 2021-11-21 MED ORDER — ECONAZOLE NITRATE 1 % EX CREA: 1.0000 "application " | TOPICAL_CREAM | Freq: Two times a day (BID) | CUTANEOUS | 0 refills | Status: AC | PRN

## 2021-11-21 MED ORDER — DICLOFENAC SODIUM 1 % EX GEL: CUTANEOUS | 1 refills | Status: DC

## 2021-11-21 MED ORDER — ALBUTEROL SULFATE (2.5 MG/3ML) 0.083% IN NEBU: 2.5000 mg | INHALATION_SOLUTION | Freq: Three times a day (TID) | RESPIRATORY_TRACT | 1 refills | Status: DC

## 2021-11-21 MED ORDER — LOSARTAN POTASSIUM 100 MG PO TABS: 100.0000 mg | ORAL_TABLET | Freq: Every day | ORAL | 1 refills | Status: DC

## 2021-11-21 MED ORDER — METOPROLOL TARTRATE 50 MG PO TABS: 50.0000 mg | ORAL_TABLET | Freq: Two times a day (BID) | ORAL | 1 refills | Status: DC

## 2021-11-21 MED ORDER — ALBUTEROL SULFATE HFA 108 (90 BASE) MCG/ACT IN AERS: 2.0000 | INHALATION_SPRAY | RESPIRATORY_TRACT | 0 refills | Status: AC | PRN

## 2021-11-21 NOTE — Telephone Encounter (Signed)
Sonie from [facility] called for verbal orders for PT 1 time a week for 8 weeks.  ? ?Sonie ok to leave v/m  ?[phone] ?
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
## 2021-11-21 NOTE — Patient Instructions (Signed)
Nice to see you today ?I want to see you in 8 weeks sooner if you need me ?I will be in touch with lab results. ?

## 2021-11-21 NOTE — Telephone Encounter (Signed)
Ok to approve for verbal orders ?

## 2021-11-21 NOTE — Telephone Encounter (Signed)
Sonie advised ?

## 2021-11-22 ENCOUNTER — Telehealth: Payer: Self-pay | Admitting: Nurse Practitioner

## 2021-11-22 ENCOUNTER — Encounter: Payer: Self-pay | Admitting: Nurse Practitioner

## 2021-11-22 DIAGNOSIS — R7989 Other specified abnormal findings of blood chemistry: Secondary | ICD-10-CM | POA: Insufficient documentation

## 2021-11-22 DIAGNOSIS — G8114 Spastic hemiplegia affecting left nondominant side: Secondary | ICD-10-CM

## 2021-11-22 LAB — URINE CULTURE
MICRO NUMBER:: 13326586
Result:: NO GROWTH
SPECIMEN QUALITY:: ADEQUATE

## 2021-11-22 NOTE — Assessment & Plan Note (Signed)
Has been evaluated by speech therapy and modified diet.  Patient pending in-home speech therapy evaluation ?

## 2021-11-22 NOTE — Assessment & Plan Note (Addendum)
Needs repeat chest x-ray pending 1 view chest x-ray.  Patient will need periodic movement in order to prevent further deterioration as being immobile in the bed will increase his risk for recurrence of pneumonia.  Patient has a [employer] fast streamer currently at home and we are working on getting home health and updated equipment as needed ?

## 2021-11-22 NOTE — Assessment & Plan Note (Signed)
Pending recheck today and iron studies. ?

## 2021-11-22 NOTE — Assessment & Plan Note (Signed)
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

## 2021-11-22 NOTE — Assessment & Plan Note (Signed)
Currently maintained on dantrolene 100 mg twice daily and tizanidine 6 mg 3 times daily.  Pending appointment with neurology.  Did miss appointment with [place] neurology as it was scheduled at the same time as our visit today and patient's issue is transportation.  We will reach out to see if local neurologists can manage spastic hemiplegia with Botox injections. ?

## 2021-11-22 NOTE — Assessment & Plan Note (Signed)
Multiple thyroid nodules that do not meet criteria for further evaluation from history gathered.  Continue surveillance. ?

## 2021-11-22 NOTE — Assessment & Plan Note (Signed)
Patient to continue Toprol and losartan as prescribed continue taking blood pressures at least 3 times weekly if not daily  ?

## 2021-11-22 NOTE — Telephone Encounter (Signed)
Can we reach out to [facility] and see if they can manage spastic left sided hemiparesis that is requiring botox injections please ?

## 2021-11-22 NOTE — Assessment & Plan Note (Signed)
Still profound global weakness per sister report.  Patient pending PT/OT/ST and home consults. ?

## 2021-11-22 NOTE — Assessment & Plan Note (Addendum)
Resultant from TBI from [facility] many years ago.  Patient was followed by neurology and is managed with antispasmodics and Botox injections.  Continue following with neurology. Patient requires periodic movement to effect improvement or further deterioration. ?Patient and/or caregiver unable to bodily transfer patient safely without lift ? ?

## 2021-11-22 NOTE — Assessment & Plan Note (Signed)
Patient is still deconditioned.  Do have private and home nurse helping along with multiple pieces of equipment such as electric wheelchair, hospital bed, Hoyer lift.  Patient still pending PT/OT/ST and home for well [facility]. ?

## 2021-11-24 ENCOUNTER — Telehealth: Payer: Self-pay

## 2021-11-24 ENCOUNTER — Other Ambulatory Visit: Payer: Self-pay | Admitting: Nurse Practitioner

## 2021-11-24 DIAGNOSIS — S069XAS Unspecified intracranial injury with loss of consciousness status unknown, sequela: Secondary | ICD-10-CM

## 2021-11-24 DIAGNOSIS — R531 Weakness: Secondary | ICD-10-CM

## 2021-11-24 DIAGNOSIS — D508 Other iron deficiency anemias: Secondary | ICD-10-CM

## 2021-11-24 DIAGNOSIS — G8114 Spastic hemiplegia affecting left nondominant side: Secondary | ICD-10-CM

## 2021-11-24 MED ORDER — IRON (FERROUS SULFATE) 325 (65 FE) MG PO TABS: 325.0000 mg | ORAL_TABLET | Freq: Every day | ORAL | 1 refills | Status: DC

## 2021-11-24 NOTE — Telephone Encounter (Signed)
Verbal orders  ?Austin Conner  ?[facility]  ?[phone] ok to leave v/m  ? ?Skilled nursing 1 a week for 9 weeks. For Med management and possible wound care.  ?

## 2021-11-24 NOTE — Telephone Encounter (Signed)
Can we call and inform the patient and [name] (sister). I will place a referral for Dr. Ronny Flurry ?

## 2021-11-24 NOTE — Telephone Encounter (Signed)
Noted! Thank you

## 2021-11-24 NOTE — Telephone Encounter (Signed)
Patient received call from insurance that medication will need authorization for Protonix he has 20 days worth.   ?

## 2021-11-24 NOTE — Telephone Encounter (Signed)
Dr Ronny Flurry that office would be able to see patient for this per their office representative ?

## 2021-11-24 NOTE — Telephone Encounter (Signed)
Called [facility] office and left a message for the nurse to call me back about this. ?

## 2021-11-24 NOTE — Telephone Encounter (Signed)
Noted  

## 2021-11-24 NOTE — Telephone Encounter (Signed)
[name] said that Austin Conner was going to look over patient's labs and then give feedback in regards to nutritional supplements that patient should try to take or drink possibly? [name] said this was discussed during last OV. ?

## 2021-11-24 NOTE — Telephone Encounter (Signed)
PA started via covermymeds. Key: [id number] ?

## 2021-11-24 NOTE — Addendum Note (Signed)
Addended by: Kris Mouton on: 11/24/2021 06:15 PM ? ? Modules accepted: Orders ? ?

## 2021-11-24 NOTE — Telephone Encounter (Signed)
Austin Conner, advised. Per [name], patient's sister its a red "sheer like skin" area near coccyx area that she noticed on Saturday morning 11/22/21  while giving him a bath. It is sore and patient not able to sit up too long. Austin Conner will be going out to see patient tomorrow or Wednesday. [name] found OTC wound bandage care for this in the meantime. ?

## 2021-11-24 NOTE — Telephone Encounter (Signed)
PA approved. Will advise [name] once I call her back in regards to the other 2 messages, dates  ?Effective from 11/24/2021 through 11/25/2022. ?

## 2021-11-24 NOTE — Telephone Encounter (Signed)
Austin Conner advised ?

## 2021-11-24 NOTE — Telephone Encounter (Signed)
[name], patient's sister, states they had Austin Conner lift for the patient and they had this for about a year and moving from [place] here maybe something broke in the transport. If approved advised [name] I would check with [place] if they can help with getting this ordered and delivered for the patient. ?

## 2021-11-24 NOTE — Telephone Encounter (Signed)
Approve verbal orders. I just saw him where is the wound? ?

## 2021-11-25 NOTE — Telephone Encounter (Signed)
Noted, judith advised. See lab result note ?

## 2021-11-25 NOTE — Telephone Encounter (Signed)
Sent a message via lab results yesterday after hours in regards to this ?

## 2021-11-25 NOTE — Telephone Encounter (Signed)
Judith advised. 

## 2021-11-26 ENCOUNTER — Telehealth: Payer: Self-pay | Admitting: Nurse Practitioner

## 2021-11-26 NOTE — Telephone Encounter (Signed)
HH ORDERS  ? ?Caller Name: Colletta Maryland  ?Home Health Agency Name: [facility] Therapist [facility] ?Callback Phone #: [phone] ?Service Requested: OT ?(examples: OT/PT/Skilled Nursing/Social Work/Speech Therapy/Wound Care) ?Frequency of Visits: 1 time a week for 6 weeks  ? ? ?  ?

## 2021-11-26 NOTE — Telephone Encounter (Signed)
Verbal orders ok for therapy  ? ?

## 2021-11-27 NOTE — Telephone Encounter (Signed)
Left detailed message for Austin Conner with ok for verbal orders ?

## 2021-11-28 NOTE — Addendum Note (Signed)
Addended by: Kris Mouton on: 11/28/2021 10:29 AM ? ? Modules accepted: Orders ? ?

## 2021-12-02 ENCOUNTER — Telehealth: Payer: Self-pay | Admitting: Nurse Practitioner

## 2021-12-02 NOTE — Telephone Encounter (Signed)
Home Health Verbal Orders ?Caller Name: Maisie Fus ?Agency Name: [facility] ? ?Callback number: [phone] ? ?Requesting: Social Work ? ?Reason: Needs in home services  ? ?Frequency: 1 time evaluation then assess further needs. ? ?Please forward to [facility] pool or providers CMA  ?

## 2021-12-02 NOTE — Telephone Encounter (Signed)
Verbal orders ok

## 2021-12-02 NOTE — Telephone Encounter (Signed)
Left detailed message with verbal ok from provider ?

## 2021-12-08 ENCOUNTER — Telehealth: Payer: Self-pay

## 2021-12-08 ENCOUNTER — Other Ambulatory Visit: Payer: Self-pay | Admitting: Nurse Practitioner

## 2021-12-08 DIAGNOSIS — G8114 Spastic hemiplegia affecting left nondominant side: Secondary | ICD-10-CM

## 2021-12-08 DIAGNOSIS — M62838 Other muscle spasm: Secondary | ICD-10-CM

## 2021-12-08 DIAGNOSIS — R531 Weakness: Secondary | ICD-10-CM

## 2021-12-08 DIAGNOSIS — R0602 Shortness of breath: Secondary | ICD-10-CM

## 2021-12-08 NOTE — Telephone Encounter (Signed)
Sister called requesting order to adapt home health.  ? ?Bariatric hospital bed and alternating air mattress for bed. Patient is now bed bound. Spastic hemiplegia affecting left nondominant side  ? ?

## 2021-12-08 NOTE — Telephone Encounter (Signed)
Not sure that he needs a bariatric bed. Can they not send the order over for [place] to sign. ALso I dont think I have gotten any progress notes yet. Unless they are in my box currenlty ?

## 2021-12-08 NOTE — Telephone Encounter (Signed)
I called [facility] and spoke with [name] who was in scheduling and said she sees a note from the sister that she called stating the bed/mattress that was given was not working and needed a different one possibly. Per [name] they have a department they deal with for orders like this but could not give me more information, I left a voicemail on nurse line at [facility] to call me back about this. ?

## 2021-12-09 ENCOUNTER — Encounter: Payer: Self-pay | Admitting: Nurse Practitioner

## 2021-12-09 ENCOUNTER — Telehealth: Payer: Self-pay

## 2021-12-09 NOTE — Telephone Encounter (Signed)
Pt's wife called and wanted to make sure that the mychart messages she sent came through. Pt has gone 9 days without a BM and wife is very concerned. I assured her that the messages have been routed to provider and she should hear something back today. Please call wife instead of reply via mychart as she does not get a notification when a message has been sent.  ? ?

## 2021-12-09 NOTE — Telephone Encounter (Signed)
Will give advice and have it relayed via phone call ? ?

## 2021-12-09 NOTE — Telephone Encounter (Signed)
Patients family wanted a phone call instead of a mychart message. He is on iron that can cause him to get constipated. So to get his bowels moving now they can use over the counter ducolax suppositories as directed on the box. He is still using the mira lax daily? She mentions she has given the mira lax. If the suppository doesn't work. If he does not have a BM they need to let me know. ?

## 2021-12-10 NOTE — Addendum Note (Signed)
Addended by: Kris Mouton on: 12/10/2021 02:07 PM ? ? Modules accepted: Orders ? ?

## 2021-12-10 NOTE — Telephone Encounter (Signed)
If patient is having a hard time with the iron and constipation we can decrease how often he takes it. He can do every other day dosing with the iron. It is too soon to recheck the iron levels. If he continues to have trouble with the oral iron and constipation we can consider a consult for IV iron ? ?The blood that she saw likely could be from the constipation and the hard ball like stools. Preparation H is an over the counter cream for hemorrhoids  ?

## 2021-12-10 NOTE — Telephone Encounter (Signed)
Spoke with [name], patient does not need this refilled, he has not had to use this inhaler, this was an automated refill request. Denying refill request ?

## 2021-12-10 NOTE — Telephone Encounter (Signed)
Spoke with [name], patient's sister, she is requesting an order for bariatric bed with alternating mattress to help her raise and move patient due to he is still not strong enough to move himself around and she is not able to do this on her own unless she has someone there to help her to use the [place] lift. 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 [facility] is coming out today to follow up on patient and the ulcer. [facility] gave patient a bariatric bed but it was used, old and the mattress did not fit and they turned it back in and patient is using a hospital bed now with alternating mattress, the mattress part is more comfortable for the patient but with him been so big/tall and not been able to move him on her own to make sure he is not just laying there all day she wanted to try bariatric bed with his health conditions and weakness. She would like to go through [facility]. Please review and then I can send Morse a message. ? ?See other phone note about other questions ?

## 2021-12-10 NOTE — Telephone Encounter (Signed)
1) Spoke with [name] and apologized for the delay in getting back to her yesterday as I was out of the office. [name] got fleets OTC for the stools for the patient and has been pushing fruits doubled up on stool softners yesterday and she was able to move patient in a sitting position with a help of someone else and the hoyt lift and patient was able to finally have a bowel movement. Stools were hard and in a shape of balls, his rectum was red and irritated after and she noted some dark red blood with wiping from the stools. Wonders if he needs to try hemorrhoid cream to help soothe the area? This was yesterday, patient wears pull up and there has not been any blood on that or in the rectum after the bowel movement episode. Usually patient has a bowel movement every 3 days but since starting on Iron tablets has been constipated. She is not giving patient Miralax as that gives patient bad diarrhea and with him not been able to go to the bathroom fast she does not want patient to suffer with that.  ? ?2) [name] wanted to know if patient is needing to re check his Iron levels now to see if he needs to continue same dose or be able to go down on this. And if so would like to see if [facility] can come out and do the lab draw for [place] . ?

## 2021-12-10 NOTE — Telephone Encounter (Signed)
Austin Conner advised. Order put in for bed and mattress and message sent to [place]. ?

## 2021-12-10 NOTE — Telephone Encounter (Signed)
Adding to the note below: Fredric Mare (Sister) "has tried to reach out to the nurse 3 times to talk about Max going 9 days without a BM, she would like to figure out what is going on." Please advise. ? ?Callback Number: [phone]  ?

## 2021-12-10 NOTE — Telephone Encounter (Signed)
Austin Conner advised. 

## 2021-12-10 NOTE — Telephone Encounter (Signed)
This is fine. If the patient continues to deteriorate with the in home health and equipment we will need to consider placement for the patient in a facility  ?

## 2021-12-10 NOTE — Telephone Encounter (Signed)
See phone note

## 2021-12-15 ENCOUNTER — Other Ambulatory Visit: Payer: Self-pay

## 2021-12-15 NOTE — Telephone Encounter (Signed)
Austin Conner, asked for refill for Tizanidine. [place] location in the chart is accurate.

## 2021-12-16 MED ORDER — TIZANIDINE HCL 6 MG PO CAPS: 6.0000 mg | ORAL_CAPSULE | Freq: Three times a day (TID) | ORAL | 0 refills | Status: DC

## 2021-12-19 DIAGNOSIS — G8194 Hemiplegia, unspecified affecting left nondominant side: Secondary | ICD-10-CM | POA: Diagnosis not present

## 2021-12-19 DIAGNOSIS — I10 Essential (primary) hypertension: Secondary | ICD-10-CM | POA: Diagnosis not present

## 2021-12-19 DIAGNOSIS — N400 Enlarged prostate without lower urinary tract symptoms: Secondary | ICD-10-CM | POA: Diagnosis not present

## 2021-12-19 DIAGNOSIS — G8250 Quadriplegia, unspecified: Secondary | ICD-10-CM | POA: Diagnosis not present

## 2021-12-19 DIAGNOSIS — K592 Neurogenic bowel, not elsewhere classified: Secondary | ICD-10-CM | POA: Diagnosis not present

## 2021-12-19 DIAGNOSIS — K219 Gastro-esophageal reflux disease without esophagitis: Secondary | ICD-10-CM | POA: Diagnosis not present

## 2021-12-19 DIAGNOSIS — R471 Dysarthria and anarthria: Secondary | ICD-10-CM | POA: Diagnosis not present

## 2021-12-19 DIAGNOSIS — Z8701 Personal history of pneumonia (recurrent): Secondary | ICD-10-CM | POA: Diagnosis not present

## 2021-12-19 DIAGNOSIS — Z9181 History of falling: Secondary | ICD-10-CM | POA: Diagnosis not present

## 2021-12-19 DIAGNOSIS — H6122 Impacted cerumen, left ear: Secondary | ICD-10-CM | POA: Diagnosis not present

## 2021-12-19 DIAGNOSIS — R1310 Dysphagia, unspecified: Secondary | ICD-10-CM | POA: Diagnosis not present

## 2021-12-19 DIAGNOSIS — N319 Neuromuscular dysfunction of bladder, unspecified: Secondary | ICD-10-CM | POA: Diagnosis not present

## 2021-12-24 ENCOUNTER — Ambulatory Visit: Payer: Self-pay | Admitting: Internal Medicine

## 2021-12-26 ENCOUNTER — Telehealth: Payer: Self-pay | Admitting: Nurse Practitioner

## 2021-12-26 NOTE — Telephone Encounter (Signed)
[name] from [facility] home heath called and stated that pt need a verbal 1 day 1 and pt discharged from nurse service only. Any questions lena number is [phone]

## 2021-12-26 NOTE — Telephone Encounter (Signed)
Spoke with Austin Conner to get clarification, patient was scheduled to be discharged from skilled nursing services today but do to the schedule and other discharges with patients today Austin Conner could not get this done today and just needs an order for 1 time 1 day to D/C these services if the provider is ok with this. The pressure ulcer has healed completely. Patient would continue therapy services. When calling back if Austin Conner does not respond its ok to leave her a detailed message

## 2021-12-26 NOTE — Telephone Encounter (Signed)
Verbal orders ok

## 2021-12-26 NOTE — Telephone Encounter (Signed)
[name] advised

## 2021-12-26 NOTE — Telephone Encounter (Signed)
Can we call and clarify what type of order they are needing and is patient being discharge because he no longer needs the services?

## 2021-12-29 ENCOUNTER — Telehealth: Payer: Self-pay | Admitting: Nurse Practitioner

## 2021-12-29 NOTE — Telephone Encounter (Signed)
Left detailed message for Colletta Maryland with these orders

## 2021-12-29 NOTE — Telephone Encounter (Signed)
We can discontinue the iron all together and see if that helps with his bowel movements.   Has this paperwork been placed for me to review/feel out yet?

## 2021-12-29 NOTE — Telephone Encounter (Signed)
[place] Name: [facility] Agency Name: [facility] Phone #: [phone], Direct line/secure line Service Requested: OT (examples: OT/PT/Skilled Nursing/Social Work/Speech Therapy/Wound Care) Frequency of Visits: 1 time a week for 3 weeks

## 2021-12-29 NOTE — Telephone Encounter (Signed)
Verbal orders approved.

## 2021-12-29 NOTE — Telephone Encounter (Signed)
[name] advised.   FYI Paperwork was mailed to [place] and will be in the look out for this.

## 2021-12-29 NOTE — Telephone Encounter (Signed)
Wanted to know how Austin Conner's iron levels were doing, is taking Stool softeners as suggested, but still not having a BM without a fleet enema  are there any other suggestions?   Paperwork in the mail to [place]: Personal care services from social services   Disability plates for the vehicle, just got car registered  Austin Conner can pick these forms up when completed if we let her know

## 2021-12-31 ENCOUNTER — Telehealth: Payer: Self-pay | Admitting: Nurse Practitioner

## 2021-12-31 NOTE — Telephone Encounter (Signed)
Placed paperwork in [name]'s inbox

## 2021-12-31 NOTE — Telephone Encounter (Signed)
Pt has paperwork for a Disability parking placard up front in [place] box. There is a yellow note on the paperwork as well, please advise when possible.  Callback Number: [phone]

## 2022-01-02 ENCOUNTER — Other Ambulatory Visit: Payer: Self-pay | Admitting: Nurse Practitioner

## 2022-01-02 DIAGNOSIS — R0602 Shortness of breath: Secondary | ICD-10-CM

## 2022-01-02 NOTE — Telephone Encounter (Signed)
[name] called and said that her son Austin Conner is coming to pick up 01/02/22

## 2022-01-02 NOTE — Telephone Encounter (Signed)
Personal Care service request form faxed and copy left up front for Austin Conner to pick up. Handicap form placed up front for pick up. Austin Conner is aware of this

## 2022-01-07 ENCOUNTER — Other Ambulatory Visit: Payer: Self-pay | Admitting: Nurse Practitioner

## 2022-01-07 DIAGNOSIS — R0602 Shortness of breath: Secondary | ICD-10-CM

## 2022-01-07 NOTE — Telephone Encounter (Signed)
Patient's RX does not have any more refills on the RX. Patient has 5 vials left. Uses it 3 times daily. Wanted to make sure they would have refill when he is ready.

## 2022-01-07 NOTE — Telephone Encounter (Signed)
Fredric Mare (Sister) called about this medication for Austin Conner: albuterol (PROVENTIL) (2.5 MG/3ML) 0.083% nebulizer solution. She stated that the pharmacy said "it was closed for the medication." I transferred her to [place].   Callback Number: [phone]

## 2022-01-12 ENCOUNTER — Encounter: Payer: Self-pay | Admitting: Neurology

## 2022-01-12 ENCOUNTER — Other Ambulatory Visit: Payer: Self-pay | Admitting: Nurse Practitioner

## 2022-01-12 ENCOUNTER — Ambulatory Visit: Payer: Medicare Other | Admitting: Neurology

## 2022-01-12 VITALS — BP 144/89 | HR 68 | Ht 76.0 in

## 2022-01-12 DIAGNOSIS — R5381 Other malaise: Secondary | ICD-10-CM | POA: Diagnosis not present

## 2022-01-12 DIAGNOSIS — M62838 Other muscle spasm: Secondary | ICD-10-CM

## 2022-01-12 DIAGNOSIS — R531 Weakness: Secondary | ICD-10-CM | POA: Diagnosis not present

## 2022-01-12 DIAGNOSIS — W19XXXA Unspecified fall, initial encounter: Secondary | ICD-10-CM | POA: Diagnosis not present

## 2022-01-12 DIAGNOSIS — Z7401 Bed confinement status: Secondary | ICD-10-CM | POA: Diagnosis not present

## 2022-01-12 DIAGNOSIS — S0990XA Unspecified injury of head, initial encounter: Secondary | ICD-10-CM | POA: Insufficient documentation

## 2022-01-12 DIAGNOSIS — S0990XS Unspecified injury of head, sequela: Secondary | ICD-10-CM

## 2022-01-12 NOTE — Telephone Encounter (Signed)
error 
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
## 2022-01-13 ENCOUNTER — Telehealth: Payer: Self-pay | Admitting: *Deleted

## 2022-01-13 MED ORDER — TIZANIDINE HCL 6 MG PO CAPS: 6.0000 mg | ORAL_CAPSULE | Freq: Three times a day (TID) | ORAL | 0 refills | Status: DC

## 2022-01-13 NOTE — Telephone Encounter (Signed)
Error

## 2022-01-16 ENCOUNTER — Telehealth: Payer: Self-pay

## 2022-01-16 ENCOUNTER — Ambulatory Visit (INDEPENDENT_AMBULATORY_CARE_PROVIDER_SITE_OTHER): Payer: Medicare Other | Admitting: Nurse Practitioner

## 2022-01-16 ENCOUNTER — Ambulatory Visit (INDEPENDENT_AMBULATORY_CARE_PROVIDER_SITE_OTHER)
Admission: RE | Admit: 2022-01-16 | Discharge: 2022-01-16 | Disposition: A | Discharge status: Home / Self Care | Payer: Medicare Other | Source: Ambulatory Visit | Attending: Nurse Practitioner | Admitting: Nurse Practitioner

## 2022-01-16 VITALS — BP 136/80 | HR 68 | Temp 97.5°F

## 2022-01-16 DIAGNOSIS — E611 Iron deficiency: Secondary | ICD-10-CM

## 2022-01-16 DIAGNOSIS — G8114 Spastic hemiplegia affecting left nondominant side: Secondary | ICD-10-CM

## 2022-01-16 DIAGNOSIS — J690 Pneumonitis due to inhalation of food and vomit: Secondary | ICD-10-CM | POA: Diagnosis not present

## 2022-01-16 DIAGNOSIS — R0902 Hypoxemia: Secondary | ICD-10-CM | POA: Diagnosis not present

## 2022-01-16 DIAGNOSIS — R7989 Other specified abnormal findings of blood chemistry: Secondary | ICD-10-CM

## 2022-01-16 DIAGNOSIS — I10 Essential (primary) hypertension: Secondary | ICD-10-CM

## 2022-01-16 DIAGNOSIS — M62838 Other muscle spasm: Secondary | ICD-10-CM

## 2022-01-16 DIAGNOSIS — W19XXXA Unspecified fall, initial encounter: Secondary | ICD-10-CM | POA: Diagnosis not present

## 2022-01-16 DIAGNOSIS — J189 Pneumonia, unspecified organism: Secondary | ICD-10-CM | POA: Diagnosis not present

## 2022-01-16 DIAGNOSIS — D509 Iron deficiency anemia, unspecified: Secondary | ICD-10-CM | POA: Insufficient documentation

## 2022-01-16 DIAGNOSIS — Z743 Need for continuous supervision: Secondary | ICD-10-CM | POA: Diagnosis not present

## 2022-01-16 LAB — IBC + FERRITIN
Ferritin: 378.3 ng/mL — ABNORMAL HIGH (ref 22.0–322.0)
Iron: 38 ug/dL — ABNORMAL LOW (ref 42–165)
Saturation Ratios: 16.1 % — ABNORMAL LOW (ref 20.0–50.0)
TIBC: 236.6 ug/dL — ABNORMAL LOW (ref 250.0–450.0)
Transferrin: 169 mg/dL — ABNORMAL LOW (ref 212.0–360.0)

## 2022-01-16 LAB — CBC
HCT: 36.2 % — ABNORMAL LOW (ref 39.0–52.0)
Hemoglobin: 11.6 g/dL — ABNORMAL LOW (ref 13.0–17.0)
MCHC: 32.1 g/dL (ref 30.0–36.0)
MCV: 85.4 fl (ref 78.0–100.0)
Platelets: 389 10*3/uL (ref 150.0–400.0)
RBC: 4.23 Mil/uL (ref 4.22–5.81)
RDW: 21 % — ABNORMAL HIGH (ref 11.5–15.5)
WBC: 7.8 10*3/uL (ref 4.0–10.5)

## 2022-01-16 LAB — BASIC METABOLIC PANEL
BUN: 7 mg/dL (ref 6–23)
CO2: 34 mEq/L — ABNORMAL HIGH (ref 19–32)
Calcium: 9.2 mg/dL (ref 8.4–10.5)
Chloride: 97 mEq/L (ref 96–112)
Creatinine, Ser: 0.36 mg/dL — ABNORMAL LOW (ref 0.40–1.50)
GFR: 118.3 mL/min (ref >60.00)
Glucose, Bld: 95 mg/dL (ref 70–99)
Potassium: 3.6 mEq/L (ref 3.5–5.1)
Sodium: 141 mEq/L (ref 135–145)

## 2022-01-16 NOTE — Assessment & Plan Note (Signed)
Patient with noted iron deficiency attempted to replace it orally but patient cannot tolerate.  We will recheck level today consider iron infusions

## 2022-01-16 NOTE — Assessment & Plan Note (Signed)
Currently maintained on dantrolene and tizanidine.  Patient had a patient appointment with neurology I will relinquish care of these medications to them

## 2022-01-16 NOTE — Telephone Encounter (Signed)
Patient was seen in the office today by Mordecai Maes and his sister Darel Hong brought in letter from the insurance stating that they have provided temporary supply of Tizanidine 6 mg capsule, it is not included on their list of covered drugs. Will need prior authorization for this. Mordecai Maes told Darel Hong since neurologist has taking over this medication this would need to go to their office. Not sure if Darel Hong will call them so sending a note to the neurologist to review this. Per Darel Hong patient has not taking any other muscle relaxer in the past.

## 2022-01-16 NOTE — Progress Notes (Signed)
Established Patient Office Visit  Subjective   Patient ID: Austin Conner, male    DOB: April 28, 1956  Age: 66 y.o. MRN: [id number]  Chief Complaint  Patient presents with   Follow-up    Would like to get an order for condom cath and this will be doen via [employer] per patient's sister    HPI  PNA: States that they have removed some diary. No longer coughing or having swallowing if occultly. Did do a repeat xray and it showed some improvement in the infiltrate  Iron deficenty: was placed on iron supplement but patient had constipation.  Patient was then reduced down to every other day dosing and patient still cannot tolerate so iron was discontinued  Staets that it is requiring 2 person with a hoyer lift. He has an Mining engineer wheelchair and stay up all day.   Referral for neuro rehab at [facility]. Referral was chaged to [facility] main because of patient address  Constipatoin: colace twice a dya. Mira lax as needed. Fleets emena and suppository as needed. BM every 4 days.      Review of Systems  Constitutional:  Negative for chills and fever.  Respiratory:  Negative for cough and shortness of breath.   Gastrointestinal:  Positive for constipation. Negative for nausea and vomiting.  Skin:        "-" Skin changes  Neurological:  Positive for weakness.      Objective:     BP 136/80   Pulse 68   Temp (!) 97.5 F (36.4 C)   SpO2 94%  BP Readings from Last 3 Encounters:  01/16/22 136/80  01/12/22 (!) 144/89  11/21/21 140/82   Wt Readings from Last 3 Encounters:  10/08/21 278 lb 10.6 oz (126.4 kg)  10/02/21 278 lb 10.6 oz (126.4 kg)      Physical Exam Vitals and nursing note reviewed.  Constitutional:      Appearance: Normal appearance.     Comments: Patient in nonemergency stretcher in office  Cardiovascular:     Rate and Rhythm: Normal rate and regular rhythm.     Pulses: Normal pulses.     Heart sounds: Normal heart sounds.  Pulmonary:     Breath sounds: Normal breath  sounds.     Comments: Globally decreased breath sounds Abdominal:     General: Bowel sounds are normal. There is no distension.     Palpations: There is no mass.     Tenderness: There is no abdominal tenderness.     Hernia: No hernia is present.  Skin:    General: Skin is warm.  Neurological:     Mental Status: He is alert.     Cranial Nerves: Facial asymmetry present.     Motor: Weakness present.     Deep Tendon Reflexes:     Reflex Scores:      Bicep reflexes are 2+ on the right side and 2+ on the left side.    Comments:     Psychiatric:        Mood and Affect: Mood normal.        Behavior: Behavior normal.        Thought Content: Thought content normal.        Judgment: Judgment normal.      No results found for any visits on 01/16/22.    The 10-year ASCVD risk score (Arnett DK, et al., 2019) is: 18.6%    Assessment & Plan:   Problem List Items Addressed This Visit  Cardiovascular and Mediastinum   Essential hypertension    Patient's blood pressure within normal limits today.  Continue taking medication as prescribed      Relevant Orders   CBC   Basic metabolic panel     Respiratory   Aspiration pneumonia (HCC)    Seems doing much better in office today.  Last x-ray showed improvement in opacity.  We will repeat chest x-ray today pending result      Relevant Orders   DG Chest 1 View     Nervous and Auditory   Spastic hemiplegia affecting left nondominant side (HCC)    Has been set up with neurology.  Has an appointment approximate 1 month start his Botox injections.  I have relinquished control of the tizanidine and dantrolene muscle relaxants to them      Relevant Orders   For home use only DME Other see comment     Other   Muscle spasticity    Currently maintained on dantrolene and tizanidine.  Patient had a patient appointment with neurology I will relinquish care of these medications to them      Abnormal CBC - Primary   Iron  deficiency    Patient with noted iron deficiency attempted to replace it orally but patient cannot tolerate.  We will recheck level today consider iron infusions      Relevant Orders   IBC + Ferritin    Return in about 3 months (around 04/18/2022) for recheck.    Audria Nine, NP

## 2022-01-19 MED ORDER — TIZANIDINE HCL 6 MG PO CAPS: 6.0000 mg | ORAL_CAPSULE | Freq: Three times a day (TID) | ORAL | 11 refills | Status: DC

## 2022-01-21 NOTE — Telephone Encounter (Signed)
I called [facility] and spoke to the pharmacist. He informed me they were able to get the prescription to go through the patient's insurance plan w/ a zero dollar co-pay. No PA needed. The medication was picked up on 01/19/22.

## 2022-01-26 ENCOUNTER — Telehealth: Payer: Self-pay

## 2022-01-26 DIAGNOSIS — J690 Pneumonitis due to inhalation of food and vomit: Secondary | ICD-10-CM

## 2022-01-26 NOTE — Telephone Encounter (Signed)
Went over lab and xray results with Austin Conner. If the iron IV infusion is outpatient this would be hard to accomplish due to issues with mobility and transportation. Austin Conner wonders if Austin Conner could take a Baby iron OTC (nurse from [facility] mentioned this to her before) instead to see if that would be better for the Austin Conner as far as constipation issue.  They do want to proceed with CT scan of the chest. [place] location. Austin Conner is aware that we would need to do PA for this before it can be scheduled.  Also, Austin Conner received her copy of the form for the possible program assistance we filled out for the Austin Conner (copy not in the chart yet-I asked to have this form brought to [place] again to make a copy), she saw where the question said if Austin Conner would be a candidate for institution we said yes but then crossed it out and said No and she wanted to follow up on that. He would be a candidate if she was not able to care for him, wanted to get clarification on that. Advised that we may have to wait to see the form again before been able to answer this question.

## 2022-01-26 NOTE — Telephone Encounter (Signed)
[name] advised. Patient does get fatigue and pale looking when B/P drops around 80s like it was. He is feeling better now and feels better with B/P been up. No other symptoms. [name] will follow this regimen keep is posted as needed

## 2022-01-26 NOTE — Telephone Encounter (Signed)
[name], [name] [facility]), called to report pt's blood pressure readings from this weekend:  Saturday am: 84/52 Saturday 2 hours after previous reading: 94/54 8pm on Saturday: 120/70  Sunday: 110/68 Sunday afternoon: 152/102  Monday am: 113/74  [name] withheld 2nd metoprolol on Saturday and both doses on Sunday. Pt has not had any of his meds yet this morning and [name] is wondering how to proceed from this point. Please advise, but not through mychart as [name] has been having issues logging in to [employer].

## 2022-01-26 NOTE — Telephone Encounter (Signed)
On the paper I marked no at first and then marked yes. He is a candidate for institution.   If she can find the baby iron that is fine just keep me updated on the amount she is taking and how Mr perlmutter tolerates it.   I will place the order for the CT Scan for [place]

## 2022-01-26 NOTE — Telephone Encounter (Signed)
[name] advised. I did call [facility] for CAPP application at [phone], left a message to have a call back to let them know about the question and the answer that was put on the faxed application-they may not understand which answer was accurate since both yes and no was marked

## 2022-01-26 NOTE — Telephone Encounter (Signed)
She needs to take his blood pressure before administering any blood pressure medications. If the top is below 100 he can hold the metoprolol. If she is holding the metoprolol and still getting low readings she can hold the losartan. She needs to make sure that he is drinking plenty of fluids and urinating. Any other symptoms or changes?  Looks like his blood pressure were back to normal and a little high on Sunday. If she is getting readings like Sunday then he can take medication as prescribed

## 2022-01-28 NOTE — Telephone Encounter (Signed)
Spoke with Austin Conner, they would like to go ahead and start on IV iron infusion for the patient instead of trying OTC. Austin Conner was advised that order will be reviewed and once it is faxed [facility] will call her to set this up. Order placed in [name]'s inbox to review

## 2022-01-28 NOTE — Telephone Encounter (Signed)
Austin Conner is calling in stating that she was speaking with [place] about Trev. She wanted to ask if a blood infusion would help his issue, and that his blood pressure has been stabilizing a lot better.

## 2022-01-30 ENCOUNTER — Telehealth: Payer: Self-pay

## 2022-01-30 NOTE — Telephone Encounter (Signed)
[name] called back in stating that she took Austin Conner's blood pressure this morning and it was 148/98 and she gave him Metoprolol and his blood pressure dropped to 119/76.

## 2022-01-30 NOTE — Telephone Encounter (Signed)
That medication can work rather quickly. If he is feeling ok that is a fine reading.

## 2022-01-30 NOTE — Telephone Encounter (Signed)
Error

## 2022-01-30 NOTE — Telephone Encounter (Signed)
Called and spoke with pt 's wife informed her that the reading for his b/p is okay , I  advised pt if  she has any concern over with his blood dropping to low over the weekend reach to urgent care or ed

## 2022-01-30 NOTE — Telephone Encounter (Signed)
Order signed for [facility] and placed in your box to be faxed

## 2022-01-30 NOTE — Telephone Encounter (Signed)
error 

## 2022-02-02 ENCOUNTER — Telehealth: Payer: Self-pay | Admitting: Neurology

## 2022-02-02 NOTE — Telephone Encounter (Signed)
[name] called wanting to know the update of pt's BOTOX approval. Please call her back with update due to her having to give her job a lot of time in advanced for the date's she is needing to take off for the appointments are approved.

## 2022-02-03 NOTE — Telephone Encounter (Signed)
noted 

## 2022-02-03 NOTE — Telephone Encounter (Addendum)
Patient's sister called, wanted to let us know that she is going to come by around 2 with the paperwork to have it fixed.

## 2022-02-09 ENCOUNTER — Encounter: Payer: Self-pay | Admitting: *Deleted

## 2022-02-09 ENCOUNTER — Telehealth: Payer: Self-pay | Admitting: Nurse Practitioner

## 2022-02-09 ENCOUNTER — Telehealth: Payer: Self-pay

## 2022-02-09 ENCOUNTER — Encounter: Payer: Self-pay | Admitting: Neurology

## 2022-02-09 ENCOUNTER — Telehealth: Payer: Self-pay | Admitting: Neurology

## 2022-02-09 DIAGNOSIS — E611 Iron deficiency: Secondary | ICD-10-CM

## 2022-02-09 NOTE — Telephone Encounter (Signed)
[name], Radwan Cowley inquiring about Botox appt and if have been approved. Would like a call from the Botox Coordinator. Please me back at [phone]

## 2022-02-09 NOTE — Telephone Encounter (Signed)
CT scan has not been authorized yet. Sending to [employer].   [name], were you able to order Iron infusion on the patient via epic?

## 2022-02-09 NOTE — Telephone Encounter (Signed)
I spoke with the patient's sister and the pharmacy. The medication requires a PA. I will initiate on CMM.

## 2022-02-09 NOTE — Telephone Encounter (Signed)
A PA has been started on CMM for tizanidine (Zanaflex) 6 mg capsule. Key: [id number].

## 2022-02-09 NOTE — Telephone Encounter (Signed)
Patient sister called about ct of chest and iron infusion. Call back number [phone].

## 2022-02-09 NOTE — Telephone Encounter (Signed)
Sent pt a Mychart message with an Update on Botox.

## 2022-02-09 NOTE — Telephone Encounter (Signed)
[facility] will not let us order iron infusion they require that they been seen by one of their providers

## 2022-02-09 NOTE — Telephone Encounter (Signed)
Spoke with [name]. She does would like to go ahead and referral to hematology specialist and discuss iron infusion-[place]  Advised that we are waiting on PA for Ct Scan before proceeding.

## 2022-02-09 NOTE — Telephone Encounter (Signed)
Referral placed.

## 2022-02-09 NOTE — Telephone Encounter (Signed)
[name], Garron Eline request refill for tizanidine (ZANAFLEX) 6 MG capsule at [place]

## 2022-02-10 NOTE — Telephone Encounter (Signed)
I called and spoke with the patient's sister to inform her that his tiZANidine HCl '6MG'$  capsules has been approved. She verbalized appreciation for the call.

## 2022-02-10 NOTE — Telephone Encounter (Signed)
The PA for tiZANidine HCl '6MG'$  capsules was approved. Effective from 02/10/2022 through 02/11/2023.

## 2022-02-18 ENCOUNTER — Telehealth: Payer: Self-pay | Admitting: Nurse Practitioner

## 2022-02-18 ENCOUNTER — Telehealth: Payer: Self-pay | Admitting: Neurology

## 2022-02-18 ENCOUNTER — Inpatient Hospital Stay: Payer: Medicare Other

## 2022-02-18 ENCOUNTER — Inpatient Hospital Stay: Payer: Medicare Other | Admitting: Oncology

## 2022-02-18 NOTE — Telephone Encounter (Signed)
Patients sister [name] called and is requesting call back from [place] about having some issues with the neurologist for patients botox injections. Call back is [phone]

## 2022-02-18 NOTE — Telephone Encounter (Signed)
Spoke with Fredric Mare, she was just letting us know that they have been trying to get botox injections for the patient but it seems [employer] did not receive request for PA as of 02/18/22. Benjaman Lobe that I will send a note to Mary Sella who has been working on this it looks like. If [name] can get a call about this to let her know status so she is not continuing to worry please.  Please call [name] at [phone] DPR on file to speak with her as patient not able to communicate thoroughly yet. She does not get mychart notifications about messages so she will not know someone sent her something on that.

## 2022-02-18 NOTE — Telephone Encounter (Signed)
[name] is asking that pt be called re: the insurance company not receiving anything re: an authorization for BOTOX, please call pt.  Sister informed she  is not on [facility]

## 2022-02-21 ENCOUNTER — Other Ambulatory Visit: Payer: Self-pay | Admitting: Nurse Practitioner

## 2022-02-21 DIAGNOSIS — I10 Essential (primary) hypertension: Secondary | ICD-10-CM
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
## 2022-02-23 ENCOUNTER — Encounter: Payer: Self-pay | Admitting: Nurse Practitioner

## 2022-02-23 MED ORDER — NYSTATIN 100000 UNIT/ML MT SUSP: 5.0000 mL | Freq: Four times a day (QID) | OROMUCOSAL | 0 refills | Status: DC

## 2022-02-25 ENCOUNTER — Emergency Department: Payer: Medicare Other

## 2022-02-25 ENCOUNTER — Inpatient Hospital Stay: Payer: Medicare Other

## 2022-02-25 ENCOUNTER — Inpatient Hospital Stay
Admission: EM | Admit: 2022-02-25 | Discharge: 2022-03-02 | DRG: 177 | Disposition: A | Discharge status: Home Health | Payer: Medicare Other | Attending: Hospitalist | Admitting: Hospitalist

## 2022-02-25 ENCOUNTER — Inpatient Hospital Stay (HOSPITAL_COMMUNITY): Payer: Medicare Other

## 2022-02-25 DIAGNOSIS — J690 Pneumonitis due to inhalation of food and vomit: Secondary | ICD-10-CM | POA: Diagnosis not present

## 2022-02-25 DIAGNOSIS — Z79899 Other long term (current) drug therapy: Secondary | ICD-10-CM

## 2022-02-25 DIAGNOSIS — Z881 Allergy status to other antibiotic agents status: Secondary | ICD-10-CM

## 2022-02-25 DIAGNOSIS — R1312 Dysphagia, oropharyngeal phase: Secondary | ICD-10-CM

## 2022-02-25 DIAGNOSIS — I712 Thoracic aortic aneurysm, without rupture, unspecified: Secondary | ICD-10-CM | POA: Diagnosis not present

## 2022-02-25 DIAGNOSIS — E876 Hypokalemia: Secondary | ICD-10-CM | POA: Diagnosis present

## 2022-02-25 DIAGNOSIS — R1310 Dysphagia, unspecified: Secondary | ICD-10-CM

## 2022-02-25 DIAGNOSIS — I517 Cardiomegaly: Secondary | ICD-10-CM | POA: Diagnosis not present

## 2022-02-25 DIAGNOSIS — R918 Other nonspecific abnormal finding of lung field: Secondary | ICD-10-CM | POA: Diagnosis not present

## 2022-02-25 DIAGNOSIS — I10 Essential (primary) hypertension: Secondary | ICD-10-CM | POA: Diagnosis not present

## 2022-02-25 DIAGNOSIS — J9601 Acute respiratory failure with hypoxia: Secondary | ICD-10-CM

## 2022-02-25 DIAGNOSIS — R0602 Shortness of breath: Secondary | ICD-10-CM

## 2022-02-25 DIAGNOSIS — G8194 Hemiplegia, unspecified affecting left nondominant side: Secondary | ICD-10-CM | POA: Diagnosis present

## 2022-02-25 DIAGNOSIS — J9600 Acute respiratory failure, unspecified whether with hypoxia or hypercapnia: Secondary | ICD-10-CM | POA: Diagnosis present

## 2022-02-25 DIAGNOSIS — R0603 Acute respiratory distress: Secondary | ICD-10-CM | POA: Diagnosis not present

## 2022-02-25 DIAGNOSIS — N400 Enlarged prostate without lower urinary tract symptoms: Secondary | ICD-10-CM | POA: Diagnosis not present

## 2022-02-25 DIAGNOSIS — J9811 Atelectasis: Secondary | ICD-10-CM | POA: Diagnosis not present

## 2022-02-25 DIAGNOSIS — J189 Pneumonia, unspecified organism: Secondary | ICD-10-CM | POA: Diagnosis not present

## 2022-02-25 DIAGNOSIS — Z760 Encounter for issue of repeat prescription: Secondary | ICD-10-CM

## 2022-02-25 DIAGNOSIS — Z7189 Other specified counseling: Secondary | ICD-10-CM | POA: Diagnosis not present

## 2022-02-25 DIAGNOSIS — Z87820 Personal history of traumatic brain injury: Secondary | ICD-10-CM | POA: Diagnosis not present

## 2022-02-25 DIAGNOSIS — Z7401 Bed confinement status: Secondary | ICD-10-CM

## 2022-02-25 DIAGNOSIS — Z8249 Family history of ischemic heart disease and other diseases of the circulatory system: Secondary | ICD-10-CM

## 2022-02-25 DIAGNOSIS — K219 Gastro-esophageal reflux disease without esophagitis: Secondary | ICD-10-CM

## 2022-02-25 DIAGNOSIS — M62838 Other muscle spasm: Secondary | ICD-10-CM

## 2022-02-25 DIAGNOSIS — R1313 Dysphagia, pharyngeal phase: Secondary | ICD-10-CM | POA: Diagnosis present

## 2022-02-25 DIAGNOSIS — R0689 Other abnormalities of breathing: Secondary | ICD-10-CM | POA: Diagnosis not present

## 2022-02-25 DIAGNOSIS — R0902 Hypoxemia: Secondary | ICD-10-CM | POA: Diagnosis not present

## 2022-02-25 LAB — PROTIME-INR
INR: 1.1 (ref 0.8–1.2)
Prothrombin Time: 14.1 seconds (ref 11.4–15.2)

## 2022-02-25 LAB — CBC WITH DIFFERENTIAL/PLATELET
Abs Immature Granulocytes: 0.04 10*3/uL (ref 0.00–0.07)
Basophils Absolute: 0 10*3/uL (ref 0.0–0.1)
Basophils Relative: 0 %
Eosinophils Absolute: 0.1 10*3/uL (ref 0.0–0.5)
Eosinophils Relative: 1 %
HCT: 41.6 % (ref 39.0–52.0)
Hemoglobin: 13.4 g/dL (ref 13.0–17.0)
Immature Granulocytes: 0 %
Lymphocytes Relative: 16 %
Lymphs Abs: 1.8 10*3/uL (ref 0.7–4.0)
MCH: 27.9 pg (ref 26.0–34.0)
MCHC: 32.2 g/dL (ref 30.0–36.0)
MCV: 86.5 fL (ref 80.0–100.0)
Monocytes Absolute: 0.8 10*3/uL (ref 0.1–1.0)
Monocytes Relative: 7 %
Neutro Abs: 8.5 10*3/uL — ABNORMAL HIGH (ref 1.7–7.7)
Neutrophils Relative %: 76 %
Platelets: 299 10*3/uL (ref 150–400)
RBC: 4.81 MIL/uL (ref 4.22–5.81)
RDW: 17.6 % — ABNORMAL HIGH (ref 11.5–15.5)
WBC: 11.2 10*3/uL — ABNORMAL HIGH (ref 4.0–10.5)
nRBC: 0 % (ref 0.0–0.2)

## 2022-02-25 LAB — COMPREHENSIVE METABOLIC PANEL
ALT: 10 U/L (ref 0–44)
AST: 16 U/L (ref 15–41)
Albumin: 3.5 g/dL (ref 3.5–5.0)
Alkaline Phosphatase: 66 U/L (ref 38–126)
Anion gap: 12 (ref 5–15)
BUN: 7 mg/dL — ABNORMAL LOW (ref 8–23)
CO2: 30 mmol/L (ref 22–32)
Calcium: 9 mg/dL (ref 8.9–10.3)
Chloride: 99 mmol/L (ref 98–111)
Creatinine, Ser: 0.36 mg/dL — ABNORMAL LOW (ref 0.61–1.24)
GFR, Estimated: >60 mL/min (ref >60)
Glucose, Bld: 125 mg/dL — ABNORMAL HIGH (ref 70–99)
Potassium: 3 mmol/L — ABNORMAL LOW (ref 3.5–5.1)
Sodium: 141 mmol/L (ref 135–145)
Total Bilirubin: 1 mg/dL (ref 0.3–1.2)
Total Protein: 7.8 g/dL (ref 6.5–8.1)

## 2022-02-25 LAB — TROPONIN I (HIGH SENSITIVITY)
Troponin I (High Sensitivity): 11 ng/L (ref <18)
Troponin I (High Sensitivity): 11 ng/L (ref <18)

## 2022-02-25 LAB — APTT: aPTT: 36 seconds (ref 24–36)

## 2022-02-25 LAB — URINALYSIS, COMPLETE (UACMP) WITH MICROSCOPIC
Bilirubin Urine: NEGATIVE
Glucose, UA: NEGATIVE mg/dL
Hgb urine dipstick: NEGATIVE
Ketones, ur: 20 mg/dL — AB
Leukocytes,Ua: NEGATIVE
Nitrite: NEGATIVE
Protein, ur: NEGATIVE mg/dL
Specific Gravity, Urine: 1.008 (ref 1.005–1.030)
Squamous Epithelial / HPF: NONE SEEN (ref 0–5)
pH: 7 (ref 5.0–8.0)

## 2022-02-25 LAB — MAGNESIUM: Magnesium: 1.9 mg/dL (ref 1.7–2.4)

## 2022-02-25 LAB — LACTIC ACID, PLASMA
Lactic Acid, Venous: 0.9 mmol/L (ref 0.5–1.9)
Lactic Acid, Venous: 1.2 mmol/L (ref 0.5–1.9)

## 2022-02-25 LAB — PROCALCITONIN: Procalcitonin: <0.1 ng/mL

## 2022-02-25 MED ORDER — DEXTROSE 5 % IV SOLN
500.0000 mg | Freq: Four times a day (QID) | INTRAVENOUS | Status: DC | PRN
  Administered 2022-02-25 – 2022-03-01 (×2): 500 mg via INTRAVENOUS
  Filled 2022-02-25 (×2): qty 5

## 2022-02-25 MED ORDER — ACETAMINOPHEN 325 MG PO TABS: 650.0000 mg | ORAL_TABLET | Freq: Four times a day (QID) | ORAL | Status: DC | PRN

## 2022-02-25 MED ORDER — LACTATED RINGERS IV BOLUS
1000.0000 mL | Freq: Once | INTRAVENOUS | Status: AC
  Administered 2022-02-25: 1000 mL via INTRAVENOUS

## 2022-02-25 MED ORDER — ORAL CARE MOUTH RINSE
15.0000 mL | OROMUCOSAL | Status: DC
  Administered 2022-02-25 – 2022-03-02 (×16): 15 mL via OROMUCOSAL

## 2022-02-25 MED ORDER — SODIUM CHLORIDE 0.9 % IV SOLN
3.0000 g | Freq: Four times a day (QID) | INTRAVENOUS | Status: AC
  Administered 2022-02-25 – 2022-03-02 (×20): 3 g via INTRAVENOUS
  Filled 2022-02-25 (×3): qty 3
  Filled 2022-02-25 (×2): qty 8
  Filled 2022-02-25: qty 3
  Filled 2022-02-25: qty 8
  Filled 2022-02-25 (×2): qty 3
  Filled 2022-02-25 (×2): qty 8
  Filled 2022-02-25: qty 3
  Filled 2022-02-25 (×3): qty 8
  Filled 2022-02-25 (×2): qty 3
  Filled 2022-02-25: qty 8
  Filled 2022-02-25: qty 3

## 2022-02-25 MED ORDER — POTASSIUM CHLORIDE 2 MEQ/ML IV SOLN
INTRAVENOUS | Status: AC
  Filled 2022-02-25: qty 1000

## 2022-02-25 MED ORDER — ONDANSETRON HCL 4 MG PO TABS: 4.0000 mg | ORAL_TABLET | Freq: Four times a day (QID) | ORAL | Status: DC | PRN

## 2022-02-25 MED ORDER — ALBUTEROL SULFATE (2.5 MG/3ML) 0.083% IN NEBU
2.5000 mg | INHALATION_SOLUTION | Freq: Three times a day (TID) | RESPIRATORY_TRACT | Status: DC | PRN
  Administered 2022-02-28 – 2022-03-02 (×3): 2.5 mg via RESPIRATORY_TRACT
  Filled 2022-02-25 (×3): qty 3

## 2022-02-25 MED ORDER — ONDANSETRON HCL 4 MG/2ML IJ SOLN: 4.0000 mg | Freq: Four times a day (QID) | INTRAMUSCULAR | Status: DC | PRN

## 2022-02-25 MED ORDER — SODIUM CHLORIDE 0.9 % IV SOLN
3.0000 g | Freq: Once | INTRAVENOUS | Status: AC
  Administered 2022-02-25: 3 g via INTRAVENOUS
  Filled 2022-02-25: qty 8

## 2022-02-25 MED ORDER — ENOXAPARIN SODIUM 80 MG/0.8ML IJ SOSY
0.5000 mg/kg | PREFILLED_SYRINGE | INTRAMUSCULAR | Status: DC
Start: 2022-02-25 — End: 2022-02-28
  Administered 2022-02-25 – 2022-02-28 (×4): 62.5 mg via SUBCUTANEOUS
  Filled 2022-02-25: qty 0.8
  Filled 2022-02-25: qty 0.63
  Filled 2022-02-25 (×2): qty 0.8

## 2022-02-25 MED ORDER — IOHEXOL 350 MG/ML SOLN
75.0000 mL | Freq: Once | INTRAVENOUS | Status: AC | PRN
  Administered 2022-02-25: 75 mL via INTRAVENOUS

## 2022-02-25 MED ORDER — ACETAMINOPHEN 650 MG RE SUPP: 650.0000 mg | Freq: Four times a day (QID) | RECTAL | Status: DC | PRN

## 2022-02-25 MED ORDER — ORAL CARE MOUTH RINSE: 15.0000 mL | OROMUCOSAL | Status: DC | PRN

## 2022-02-25 MED ORDER — HYDRALAZINE HCL 20 MG/ML IJ SOLN
10.0000 mg | Freq: Four times a day (QID) | INTRAMUSCULAR | Status: DC | PRN
Start: 2022-02-25 — End: 2022-03-02

## 2022-02-25 NOTE — Assessment & Plan Note (Addendum)
Aspiration pneumonitis In a patient with a known history of traumatic brain injury with complications of dysphagia who presents for evaluation of respiratory distress, hypoxia.  CT chest showed Bilateral lower lobe consolidations concerning for multifocal pneumonia, likely related to aspiration given debris in the right lower lobe bronchus. Started patient on IV Unasyn --SLP and modified barium swallow eval found silent aspiration. --pulm consulted with Dr. Lanney Gins Plan: --cont NPO --cont Unasyn for 5 day course --hypertonic saline and flutter valve with MetaNeb therapy for recruitment to help expectorate endobronchial debris. --solumedrol 40 iv and upon dc will initiate prednisone 40 mg once daily with tapering by 5 mg/day.

## 2022-02-25 NOTE — Assessment & Plan Note (Addendum)
secondary to aspiration pneumonitis.   Patient was noted to have room air pulse oximetry of 81% associated with tachypnea and accessory muscle use. He was initially placed on a nonrebreather mask by EMS then on 7 L of oxygen via nasal cannula to maintain pulse oximetry greater than 92% --weaned down to RA

## 2022-02-25 NOTE — Consult Note (Signed)
Consultation Note Date: 02/25/2022   Patient Name: Austin Conner  DOB: [birth year]  MRN: [id number]  Age / Sex: 66 y.o., male  PCP: Michela Pitcher, NP Referring Physician: Collier Bullock, MD  Reason for Consultation:  goals of care  HPI/Patient Profile: 66 y.o. male  with past medical history of traumatic brain in [place], dysphagia, BPH, HTN, hospitalization in March of this year for aspiration pneumonia-   admitted on 02/25/2022 with recurrent aspiration pneumonia. Swallow evaluation is pending. Palliative consulted for [place].    Primary Decision Maker HCPOA - sister- Bohden Dung  Discussion: Chart reviewed including labs, progress notes, imaging, notes from previous hospitalizations.  When seen by Palliative in March GOC were expressed as full code, full scope.  Per [name]- patient went to rehab and was able to return home. He is bed/chair bound at baseline. Requires lift to chair and requires non emergent ambulance transport to appointments.  [place] is oriented to time, place, situation.  He enjoys spending time with his sister, her son and her grandson. He does not express any suffering- he tells me his life is great and he is appreciative of [name].  We discussed his goals of care- he shares that he "wants to solve this problem of aspiration".  He tells me about his aspiration and all of the precautions he takes to avoid aspirating. He would want to avoid a feeding tube. We talked about the progression of dysphagia and the cycle of getting sick from aspiration pneumonia which causes deconditioning and worsened swallow which in turn increases risk for more aspiration pneumonia.  I asked him about his preferences for the aggressiveness of his medical care- if his respiratory status worsened, but he was still breathing, and still had a pulse- what are his feelings about artificial life support to keep him alive.  He  said he would not want to be put on a ventilator. I clarified with him that the alternative of this would be me ensuring his comfort and that he would die. He nodded his head yes and said that would be his preference. However, his sister disagrees- she states that he has "bounced back", has visited God and seen his parents and that it is not his time to die.  When asked about code status he states that he would do whatever his sister decided- [name] expresses desire for full code.      SUMMARY OF RECOMMENDATIONS -Continue current plan of care -There seems to be some conflict in patient's [place] and desire for escalation of care vs his sister's -PMT will continue to follow and discuss -Encouraged [name] to continue discussion and consider her brother's stated goals of care and decision making when discussing treatment making decisions with providers   Code Status/Advance Care Planning: Full code   Prognosis:   Unable to determine  Discharge Planning: To Be Determined  Primary Diagnoses: Present on Admission:  Acute respiratory failure ([place])  Aspiration pneumonia ([place])  Essential hypertension  Hypokalemia   Review of Systems  Physical Exam  Vital Signs: BP (!) 151/74   Pulse 99   Temp 98.7 F (37.1 C) (Axillary)   Resp 19   Ht '6\' 4"'$  (1.93 m)   Wt 126.4 kg Comment: 09/2021  SpO2 96%   BMI 33.92 kg/m          SpO2: SpO2: 96 % O2 Device:SpO2: 96 % O2 Flow Rate: .O2 Flow Rate (L/min): 7 L/min  IO: Intake/output summary:  Intake/Output Summary (Last 24 hours) at 02/25/2022 1555 Last data filed at 02/25/2022 1412 Gross per 24 hour  Intake 1200 ml  Output --  Net 1200 ml    LBM:   Baseline Weight: Weight: 126.4 kg (09/2021) Most recent weight: Weight: 126.4 kg (09/2021)       Thank you for this consult. Palliative medicine will continue to follow and assist as needed.  Time Total: 75 minutes Greater than 50%  of this time was spent counseling and coordinating care related  to the above assessment and plan.  Signed by: Mariana Kaufman, AGNP-C Palliative Medicine    Please contact Palliative Medicine Team phone at [phone] for questions and concerns.  For individual provider: See Shea Evans
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
## 2022-02-25 NOTE — Assessment & Plan Note (Addendum)
Supplement potassium Check magnesium levels 

## 2022-02-25 NOTE — Progress Notes (Signed)
Pharmacy Antibiotic Note  Austin Conner is a 66 y.o. male admitted on 02/25/2022 with ?aspiration pneumonia.  Pharmacy has been consulted for unasyn dosing.  -CXR: X-ray with a basilar infiltrate concerning for pneumonia versus pneumonitis from aspiration  Plan: Unasyn 3 gm IV q6h   Scr 0.36    Height: '6\' 4"'$  (193 cm) IBW/kg (Calculated) : 86.8  Temp (24hrs), Avg:98.2 F (36.8 C), Min:97.9 F (36.6 C), Max:98.5 F (36.9 C)  Recent Labs  Lab 02/25/22 0539 02/25/22 0809  WBC 11.2*  --   CREATININE 0.36*  --   LATICACIDVEN 0.9 1.2    CrCl cannot be calculated (Unknown ideal weight.).    Allergies  Allergen Reactions   Ciprofloxacin Hives    Other reaction(s): neurological reaction    Antimicrobials this admission: Unasyn 8/2   >>       >>    Dose adjustments this admission:    Microbiology results: 8/2 BCx: pend 8/2 UCx: pend    Sputum:      MRSA PCR:    Thank you for allowing pharmacy to be a part of this patient's care.  Jashon Ishida A 02/25/2022 8:55 AM

## 2022-02-25 NOTE — Progress Notes (Signed)
PHARMACIST - PHYSICIAN COMMUNICATION  CONCERNING:  Enoxaparin (Lovenox) for DVT Prophylaxis    RECOMMENDATION: Patient was prescribed enoxaprin '40mg'$  q24 hours for VTE prophylaxis.   Filed Weights   02/25/22 0830  Weight: 126.4 kg (278 lb 10.6 oz)    Body mass index is 33.92 kg/m.  Estimated Creatinine Clearance: 133.6 mL/min (A) (by C-G formula based on SCr of 0.36 mg/dL (L)).   Based on [place] patient is candidate for enoxaparin 0.'5mg'$ /kg TBW SQ every 24 hours based on BMI being >30.   DESCRIPTION: Pharmacy has adjusted enoxaparin dose per [facility] policy.  Patient is now receiving enoxaparin 62.5 mg every 24 hours    Darrick Penna, PharmD Clinical Pharmacist  02/25/2022 8:57 AM

## 2022-02-25 NOTE — ED Notes (Addendum)
Ed rn secure messaged dr Johnna Acosta requesting a update ( request made by family)

## 2022-02-25 NOTE — Assessment & Plan Note (Addendum)
Patient has a history of dysphagia following his traumatic brain injury and is usually on a chopped diet. --SLP and modified barium swallow eval found silent aspiration.  Swallow function deemed unlikely to improve. --sister and pt elected for PEG tube feeding, tube placed on 8/4 --nutrition consulted for tube feed Plan: --cont NPO --cont tube feed

## 2022-02-25 NOTE — Progress Notes (Signed)
Modified Barium Swallow Progress Note  Patient Details  Name: Austin Conner MRN: [id number] Date of Birth: 12-14-1955  Today's Date: 02/25/2022  Modified Barium Swallow completed.  Full report located under Chart Review in the Imaging Section.  Brief recommendations include the following:  Clinical Impression  Given pt's hx of dysphagia and multiple admissions for respiratory distress,  SLP was consulted for assessment of pt's oropharyngeal abilities. After reviewing chart, this writer felt it would be in pt's best interest to proceed with an instrumental swallow study. Initially, pt's sister was not in favor of another study as pt had recently had one 10/09/2021. Extensive education provided to pt's sister on increased aspiration risk given pt's multiple co-morbidities, bed bound status, recent hospitalizations, debris in pt's airway as well as limitations of assessing pt's swallow at bedside, specifically silent aspiration. After conversation and education, she was agreeable to pt returning to radiology for a Modified Barium Swallow Study.         At the time of study, pt didn't report any oral pain or odynophagia. Visual inspection of oral cavity is free of any white patches. Pt currently on 7 liters HFNC.   Pt presents with moderate oral phase and severe pharyngeal phase dysphagia that appears to be sensorimotor in nature. When consuming nectar thick liquids via spoon pt's oral phase is c/b decreased oral containment of bolus resulting in premature spillage. Pt's pharyngeal phase is more severely impaired and c/b deficits in base of tongue strength, anterior hyoid movement and delayed swallow initiation with bolus stasis in pyriform sinuses. Pharyngeal strength is reduced with portion of bolus remaining in the vallecula post swallow. Pt was not able to perform a cued dry swallow. Subsequent spoonful of nectar thick resulted in gross sensed anterior aspiration resulting in pt gasping for air with RR  increased to 35. Pt's cough was weak and ineffective in mobilizing the aspirates. When fluro was turned back on, aspirates were observed on posterior tracheal wall with aspirates across the trachea. These aspirates were silent. Study was terminated d/t concern for further respiratory compromise. At this time, recommend pt remain NPO with consideration of Palliative Care consult.     After the study, SLP viewed video with pt's sister at bedside. Moments of aspiration were identified and high risk of aspiration was further explained. While she voiced initial understanding, further education would likely be beneficial.   Swallow Evaluation Recommendations       SLP Diet Recommendations: NPO       Medication Administration: Via alternative means               Oral Care Recommendations: Oral care QID       Austin Conner B. Rutherford Nail, M.S., CCC-SLP, Mining engineer Certified Brain Injury [employer]  [place] Office [phone] Ascom [phone] Fax [phone]

## 2022-02-25 NOTE — Assessment & Plan Note (Addendum)
--  cont home metop

## 2022-02-25 NOTE — ED Triage Notes (Signed)
Patient brought in via medic from home for respiratory distress. Patient sat 81% on RA at home. Patient still being followed for aspiration PNA 09/2021.

## 2022-02-25 NOTE — ED Provider Notes (Signed)
[facility] Provider Note    Event Date/Time   First MD Initiated Contact with Patient 02/25/22 [phone]     (approximate)   History   Respiratory Distress   HPI  Austin Conner is a 66 y.o. male who presents to the ED for evaluation of Respiratory Distress   I reviewed medical DC summary from March.  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  He is largely bedbound.  Was admitted then for aspiration pneumonia and ultimately discharged to a SNF.  Patient presents to the ED today from home where he is cared for by family for evaluation of difficulty breathing.  He is typically on room air, and EMS reports finding him with sats of 81% on room air.  Improved with NRB and DuoNeb breathing treatments.  Also received 125 mg of Solu-Medrol prehospital.  Here in the ED, he is unable to relay any significant history due to history of TBI.  History limited.   Physical Exam   Triage Vital Signs: ED Triage Vitals  Enc Vitals Group     BP      Pulse      Resp      Temp      Temp src      SpO2      Weight      Height      Head Circumference      Peak Flow      Pain Score      Pain Loc      Pain Edu?      Excl. in [place]?     Most recent vital signs: Vitals:   02/25/22 0535 02/25/22 0537  BP:  (!) 165/100  Pulse:  (!) 109  Resp:  (!) 33  Temp:  97.9 F (36.6 C)  SpO2: (!) 88% (!) 88%    General: Awake, visually tracking.  Tachypneic without distress.  CV:  Good peripheral perfusion.  Tachycardic and regular Resp:  Tachypneic to nearly 30.  Rhonchorous/coarse breath sounds throughout Abd:  No distention.  Soft and benign MSK:  No deformity noted.  Neuro:  Chronic left-sided contractures noted Other:     ED Results / Procedures / Treatments   Labs (all labs ordered are listed, but only abnormal results are displayed) Labs Reviewed  COMPREHENSIVE METABOLIC PANEL - Abnormal; Notable for the following components:       Result Value   Potassium 3.0 (*)    Glucose, Bld 125 (*)    BUN 7 (*)    Creatinine, Ser 0.36 (*)    All other components within normal limits  CBC WITH DIFFERENTIAL/PLATELET - Abnormal; Notable for the following components:   WBC 11.2 (*)    RDW 17.6 (*)    Neutro Abs 8.5 (*)    All other components within normal limits  URINALYSIS, COMPLETE (UACMP) WITH MICROSCOPIC - Abnormal; Notable for the following components:   Color, Urine YELLOW (*)    APPearance CLEAR (*)    Ketones, ur 20 (*)    Bacteria, UA RARE (*)    All other components within normal limits  CULTURE, BLOOD (ROUTINE X 2)  CULTURE, BLOOD (ROUTINE X 2)  URINE CULTURE  LACTIC ACID, PLASMA  PROTIME-INR  APTT  LACTIC ACID, PLASMA  PROCALCITONIN  TROPONIN I (HIGH SENSITIVITY)  TROPONIN I (HIGH SENSITIVITY)    EKG Poor quality EKG seems to demonstrate sinus tachycardia   RADIOLOGY 1 view CXR interpreted by me with right  basilar atelectasis versus infiltrate.  Official radiology report(s): DG Chest Port 1 View  Result Date: 02/25/2022 CLINICAL DATA:  Questionable sepsis EXAM: PORTABLE CHEST 1 VIEW COMPARISON:  01/16/2022 FINDINGS: Low volume chest with streaky and indistinct density at the bases, mildly progressed at the right base. Chronic cardiomegaly. Limited assessment of mediastinal contours due to rightward rotation. There may be chronic rightward displacement of the trachea, usually from thyroid enlargement. No visible effusion or pneumothorax. IMPRESSION: Atelectasis or infiltrates at the lung bases, increased on the right. Electronically Signed   By: Jorje Guild M.D.   On: 02/25/2022 06:19    PROCEDURES and INTERVENTIONS:  .1-3 Lead EKG Interpretation  Performed by: Vladimir Crofts, MD Authorized by: Vladimir Crofts, MD     Interpretation: abnormal     ECG rate:  108   ECG rate assessment: tachycardic     Rhythm: sinus tachycardia     Ectopy: none     Conduction: normal   .Critical Care  Performed by:  Vladimir Crofts, MD Authorized by: Vladimir Crofts, MD   Critical care provider statement:    Critical care time (minutes):  30   Critical care time was exclusive of:  Separately billable procedures and treating other patients   Critical care was necessary to treat or prevent imminent or life-threatening deterioration of the following conditions:  Respiratory failure and sepsis   Critical care was time spent personally by me on the following activities:  Development of treatment plan with patient or surrogate, discussions with consultants, evaluation of patient's response to treatment, examination of patient, ordering and review of laboratory studies, ordering and review of radiographic studies, ordering and performing treatments and interventions, pulse oximetry, re-evaluation of patient's condition and review of old charts   Medications  Ampicillin-Sulbactam (UNASYN) 3 g in sodium chloride 0.9 % 100 mL IVPB (has no administration in time range)  lactated ringers bolus 1,000 mL (has no administration in time range)     IMPRESSION / MDM / [place] / ED COURSE  I reviewed the triage vital signs and the nursing notes.  Differential diagnosis includes, but is not limited to, pneumonia pneumothorax, sepsis, ACS, CHF, COPD  {Patient presents with symptoms of an acute illness or injury that is potentially life-threatening.   66 year old TBI patient that is largely bedbound presents with hypoxic respiratory failure requiring medical admission and antibiotics to treat for aspiration pneumonia.  Tachycardic and hypoxic, but remains hemodynamically stable.  Blood work with minimal leukocytosis.  Renal function around baseline.  Lactic acid is normal without signs of severe sepsis.  X-ray with a basilar infiltrate concerning for pneumonia versus pneumonitis from aspiration.  Cultures were drawn and we will get him started on Unasyn.  I consulted with medicine who agrees to admit.  Clinical Course  as of 02/25/22 0715  Wed Feb 25, 2022  0715 I consult hospitalist who agrees to admit. [DS]    Clinical Course User Index [DS] Vladimir Crofts, MD     FINAL CLINICAL IMPRESSION(S) / ED DIAGNOSES   Final diagnoses:  Acute respiratory failure with hypoxia ([place])  Shortness of breath     Rx / DC Orders   ED Discharge Orders     None        Note:  This document was prepared using Dragon voice recognition software and may include unintentional dictation errors.   Vladimir Crofts, MD 02/25/22 [phone]

## 2022-02-25 NOTE — Plan of Care (Signed)
  Problem: Education: Goal: Knowledge of General Education information will improve Description Including pain rating scale, medication(s)/side effects and non-pharmacologic comfort measures Outcome: Progressing   

## 2022-02-25 NOTE — ED Notes (Signed)
Md hospitalist was assigned to patient 15 minutes prior , pts family requesting to see md , ed rn stated md will make rounds asap, pts family upset wants rn to call md , ed rn stated md is aware patient is in ed and will see patient when she does rounds

## 2022-02-26 ENCOUNTER — Ambulatory Visit: Payer: Medicare Other

## 2022-02-26 ENCOUNTER — Inpatient Hospital Stay: Payer: Medicare Other

## 2022-02-26 ENCOUNTER — Inpatient Hospital Stay: Payer: Medicare Other | Admitting: Oncology

## 2022-02-26 DIAGNOSIS — R1312 Dysphagia, oropharyngeal phase: Secondary | ICD-10-CM

## 2022-02-26 DIAGNOSIS — Z7189 Other specified counseling: Secondary | ICD-10-CM | POA: Diagnosis not present

## 2022-02-26 DIAGNOSIS — J690 Pneumonitis due to inhalation of food and vomit: Secondary | ICD-10-CM | POA: Diagnosis not present

## 2022-02-26 DIAGNOSIS — J9601 Acute respiratory failure with hypoxia: Secondary | ICD-10-CM

## 2022-02-26 DIAGNOSIS — I712 Thoracic aortic aneurysm, without rupture, unspecified: Secondary | ICD-10-CM

## 2022-02-26 LAB — BLOOD CULTURE ID PANEL (REFLEXED) - BCID2

## 2022-02-26 LAB — BASIC METABOLIC PANEL
Anion gap: 8 (ref 5–15)
BUN: 9 mg/dL (ref 8–23)
CO2: 31 mmol/L (ref 22–32)
Calcium: 8.9 mg/dL (ref 8.9–10.3)
Chloride: 104 mmol/L (ref 98–111)
Creatinine, Ser: 0.39 mg/dL — ABNORMAL LOW (ref 0.61–1.24)
GFR, Estimated: >60 mL/min (ref >60)
Glucose, Bld: 98 mg/dL (ref 70–99)
Potassium: 3.1 mmol/L — ABNORMAL LOW (ref 3.5–5.1)
Sodium: 143 mmol/L (ref 135–145)

## 2022-02-26 LAB — URINE CULTURE

## 2022-02-26 LAB — CBC
HCT: 37.7 % — ABNORMAL LOW (ref 39.0–52.0)
Hemoglobin: 12.1 g/dL — ABNORMAL LOW (ref 13.0–17.0)
MCH: 28.1 pg (ref 26.0–34.0)
MCHC: 32.1 g/dL (ref 30.0–36.0)
MCV: 87.5 fL (ref 80.0–100.0)
Platelets: 261 10*3/uL (ref 150–400)
RBC: 4.31 MIL/uL (ref 4.22–5.81)
RDW: 17.6 % — ABNORMAL HIGH (ref 11.5–15.5)
WBC: 13.9 10*3/uL — ABNORMAL HIGH (ref 4.0–10.5)
nRBC: 0 % (ref 0.0–0.2)

## 2022-02-26 LAB — PROCALCITONIN: Procalcitonin: <0.1 ng/mL

## 2022-02-26 MED ORDER — BIOTENE DRY MOUTH MT LIQD
15.0000 mL | Freq: Four times a day (QID) | OROMUCOSAL | Status: DC
  Administered 2022-02-26 – 2022-03-02 (×15): 15 mL via OROMUCOSAL

## 2022-02-26 MED ORDER — METOPROLOL TARTRATE 5 MG/5ML IV SOLN
5.0000 mg | Freq: Four times a day (QID) | INTRAVENOUS | Status: DC
  Administered 2022-02-26 – 2022-02-28 (×9): 5 mg via INTRAVENOUS
  Filled 2022-02-26 (×9): qty 5

## 2022-02-26 NOTE — Evaluation (Signed)
Physical Therapy Evaluation Patient Details Name: Austin Conner MRN: [id number] DOB: May 01, 1956 Today's Date: 02/26/2022  History of Present Illness  Pt is a 66 y.o. male presenting to hospital 8/2 with respiratory distress.  Pt admitted with acute respiratory failure, aspiration PNA, and dysphagia.  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  Clinical Impression  Prior to hospital admission, pt was most recently using hoyer lift for transfers to power w/c (requires assist with power w/c use); requires 1 assist for logrolling in bed for hygiene; prior to March 2023 pt was able to transfer and walk up to 12 feet with RW with assist (pt then with hospitalization and went to SNF); finished HHPT and HHOT around June 2023; lives with his sister and nephew (who assist with his care) in 1 level home with portable ramp to enter.  Pt and pt's sister request for ex's in bed during hospitalization for strengthening/ROM (are also open to bed mobility/progressive mobility during hospitalization as appropriate but report pt has chronic L shoulder subluxation and is limited with mobility due to needing 2 assist).  Focused on LE ex's in bed with assist (pt's HR 120-129 bpm during session--nurse notified).  Pt would benefit from skilled PT to address noted impairments and functional limitations (see below for any additional details).  Upon hospital discharge, pt would benefit from [place].    Recommendations for follow up therapy are one component of a multi-disciplinary discharge planning process, led by the attending physician.  Recommendations may be updated based on patient status, additional functional criteria and insurance authorization.  Follow Up Recommendations Home health PT      Assistance Recommended at Discharge Frequent or constant Supervision/Assistance  Patient can return home with the following  Two people to help with  walking and/or transfers;Two people to help with bathing/dressing/bathroom;Assistance with cooking/housework;Assistance with feeding;Assist for transportation;Help with stairs or ramp for entrance    Equipment Recommendations Other (comment) (pt has needed DME at home already)  Recommendations for Other Services       Functional Status Assessment Patient has had a recent decline in their functional status and/or demonstrates limited ability to make significant improvements in function in a reasonable and predictable amount of time     Precautions / Restrictions Precautions Precautions: Fall Precaution Comments: Aspiration; NPO Restrictions Weight Bearing Restrictions: No      Mobility  Bed Mobility Overal bed mobility: Needs Assistance Bed Mobility: Rolling                Transfers                   General transfer comment: Deferred (pt recently using hoyer lift)    Ambulation/Gait               General Gait Details: Deferred (pt has not ambulated since March 2023)  Stairs            Wheelchair Mobility    Modified Rankin (Stroke Patients Only)       Balance                                             Pertinent Vitals/Pain Pain Assessment Pain Assessment: No/denies pain O2 sats 92% or greater on 4 L O2 via nasal cannula during sessions activities.    Home Living  Family/patient expects to be discharged to:: Private residence Living Arrangements: Other relatives ([name] [name]) and sister's adult son) Available Help at Discharge: Family;Available 24 hours/day Type of Home: House Home Access: Ramped entrance (portable ramp)       Home Layout: One level Home Equipment: Conservation officer, nature (2 wheels);BSC/3in1;Wheelchair - power;Hospital bed Additional Comments: manual hoyer lift    Prior Function Prior Level of Function : Needs assist       Physical Assist : Mobility (physical);ADLs (physical) Mobility  (physical): Transfers;Bed mobility ADLs (physical): Bathing;Dressing;IADLs Mobility Comments: Pt 1 assist for bed mobility for hygiene; has been using hoyer lift since March 2023 (prior to that was able to transfer and walk up to 12 feet with RW with assist); uses non-emergent ambulance for transport       Hand Dominance   Dominant Hand: Right    Extremity/Trunk Assessment   Upper Extremity Assessment Upper Extremity Assessment: LUE deficits/detail;RUE deficits/detail RUE Deficits / Details: good R hand grip strength; at least 4/5 R elbow flexion (brachioradialis) and extension; AAROM shoulder flexion to grossly 60 degrees (impaired shoulder flexion ROM noted) LUE Deficits / Details: fair L hand grip strength; at least 2/5 L elbow flexion/extension; deferred L shoulder ROM (shoulder subluxation noted--pt and pt's sister report chronic subluxation)    Lower Extremity Assessment Lower Extremity Assessment: RLE deficits/detail;LLE deficits/detail RLE Deficits / Details: at least 4/5 DF; fair R quad set strength; hip flexion 2/5; DF ROM to neutral LLE Deficits / Details: at least 2/5 DF; poor L quad set strength; hip flexion at least 1/5; DF ROM to neutral    Cervical / Trunk Assessment Cervical / Trunk Assessment: Other exceptions Cervical / Trunk Exceptions: forward head/shoulders  Communication   Communication: Expressive difficulties (Difficult to understand pt at times; increased time to verbalize)  Cognition Arousal/Alertness: Awake/alert Behavior During Therapy: WFL for tasks assessed/performed Overall Cognitive Status: Within Functional Limits for tasks assessed (Oriented to at least person, place, and general situation (time not assessed))                                          General Comments  Nursing cleared pt for participation in physical therapy.  Pt agreeable to PT session.    Exercises General Exercises - Lower Extremity Ankle Circles/Pumps:  AROM, Strengthening, Both, 10 reps, Supine Heel Slides: AAROM, Strengthening, Both, 10 reps, Supine Other Exercises Other Exercises: x30 seconds B heelcord stretching Other Exercises: x10 B LE AAROM hip internal/external rotation (hip and knee flexed with foot flat on bed with assist to maintain position; external rotation and then internal rotation back to neutral position)   Assessment/Plan    PT Assessment Patient needs continued PT services  PT Problem List Decreased strength;Decreased mobility;Decreased range of motion;Decreased activity tolerance;Decreased balance;Decreased knowledge of use of DME       PT Treatment Interventions DME instruction;Functional mobility training;Therapeutic activities;Therapeutic exercise;Balance training;Patient/family education    PT Goals (Current goals can be found in the Care Plan section)  Acute Rehab PT Goals Patient Stated Goal: to maintain and improve strength and mobility during hospitalization PT Goal Formulation: With patient/family Time For Goal Achievement: 03/12/22 Potential to Achieve Goals: Fair    Frequency Min 2X/week     Co-evaluation               AM-PAC PT "6 Clicks" Mobility  Outcome Measure Help needed turning from your  back to your side while in a flat bed without using bedrails?: A Lot Help needed moving from lying on your back to sitting on the side of a flat bed without using bedrails?: Total Help needed moving to and from a bed to a chair (including a wheelchair)?: Total Help needed standing up from a chair using your arms (e.g., wheelchair or bedside chair)?: Total Help needed to walk in hospital room?: Total Help needed climbing 3-5 steps with a railing? : Total 6 Click Score: 7    End of Session Equipment Utilized During Treatment: Oxygen (4 L via nasal cannula) Activity Tolerance: Patient tolerated treatment well Patient left: in bed;with call bell/phone within reach;with bed alarm set;with  family/visitor present Nurse Communication: Mobility status;Precautions PT Visit Diagnosis: Muscle weakness (generalized) (M62.81);Other abnormalities of gait and mobility (R26.89)    Time: 6433-2951 PT Time Calculation (min) (ACUTE ONLY): 33 min   Charges:   PT Evaluation $PT Eval Low Complexity: 1 Low PT Treatments $Therapeutic Exercise: 8-22 mins       Leitha Bleak, PT 02/26/22, 1:47 PM

## 2022-02-26 NOTE — Progress Notes (Signed)
PROGRESS NOTE    Austin Conner  MVH:[id number] DOB: Jul 01, 1956 DOA: 02/25/2022 PCP: Michela Pitcher, NP  [id number]  LOS: 1 day   Brief hospital course: No notes on file  Assessment & Plan: Austin Conner is a 66 y.o. 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 Patient's sister states that he was recently started on nystatin for oral thrush and she noted that he had increased secretions during the day.  Overnight patient developed shortness of breath and respiratory distress and so EMS was called.     * Acute respiratory failure (HCC) secondary to aspiration pneumonitis.   Patient was noted to have room air pulse oximetry of 81% associated with tachypnea and accessory muscle use. He was initially placed on a nonrebreather mask by EMS but is currently on 7 L of oxygen via nasal cannula to maintain pulse oximetry greater than 92% Plan: --Continue supplemental O2 to keep sats >=90%, wean as tolerated  Aspiration pneumonia ([place]) In a patient with a known history of traumatic brain injury with complications of dysphagia who presents for evaluation of respiratory distress, hypoxia.  CT chest showed Bilateral lower lobe consolidations concerning for multifocal pneumonia, likely related to aspiration given debris in the right lower lobe bronchus. Started patient on IV Unasyn --SLP and modified barium swallow eval found silent aspiration. Plan: --cont NPO for now --need further goals of care discussion.  Dysphagia Patient has a history of dysphagia following his traumatic brain injury and is usually on a chopped diet. --SLP and modified barium swallow eval found silent aspiration. Plan: --cont NPO for now --need further goals of care discussion.  Essential hypertension Hold oral anti hyperpentive meds due  to NPO status Plan: --IV metop 5 mg q6h  Hypokalemia --monitor and replete PRN  Thoracic aortic aneurysm (HCC) --measured up to 4.0 cm. --annual imaging f/u by CTA or MRA    DVT prophylaxis: Lovenox SQ Code Status: Full code  Family Communication: sister updated at bedside today Level of care: Progressive Dispo:   The patient is from: home Anticipated d/c is to: home Anticipated d/c date is: 2-3 days Patient currently is not medically ready to d/c due to: NPO, not able to safely swallow   Subjective and Interval History:  Pt reported dyspnea better.  Feeling better.    SLP and modified barium swallow eval found silent aspiration.  Currently NPO.  Need further discussion with pt and sister about goals of care.   Objective: Vitals:   02/26/22 0500 02/26/22 0742 02/26/22 1146 02/26/22 1511  BP:  (!) 151/79 (!) 164/92 (!) 156/92  Pulse:  (!) 115 (!) 111 (!) 107  Resp:  '17 18 19  '$ Temp:  97.6 F (36.4 C) 98.4 F (36.9 C) 98.7 F (37.1 C)  TempSrc:  Oral Oral Oral  SpO2:  97% 98% 100%  Weight: 126.4 kg     Height:        Intake/Output Summary (Last 24 hours) at 02/26/2022 1914 Last data filed at 02/26/2022 1902 Gross per 24 hour  Intake 150 ml  Output 1075 ml  Net -925 ml   Filed Weights   02/25/22 0830 02/26/22 0500  Weight: 126.4 kg 126.4 kg    Examination:   Constitutional: NAD, AAOx3 HEENT: conjunctivae and lids normal, EOMI CV: No cyanosis.   RESP: normal respiratory effort, on 4L SKIN: warm,  dry Neuro: II - XII grossly intact.   Psych: Normal mood and affect.  Appropriate judgement and reason   Data Reviewed: I have personally reviewed labs and imaging studies  Time spent: 60 minutes  Enzo Bi, MD [facility] If 7PM-7AM, please contact night-coverage 02/26/2022, 7:14 PM

## 2022-02-26 NOTE — Plan of Care (Signed)
  Problem: Clinical Measurements: Goal: Ability to maintain clinical measurements within normal limits will improve Outcome: Progressing   Problem: Clinical Measurements: Goal: Will remain free from infection Outcome: Progressing   Problem: Clinical Measurements: Goal: Diagnostic test results will improve Outcome: Progressing   

## 2022-02-26 NOTE — Progress Notes (Addendum)
Daily Progress Note   Patient Name: Austin Conner       Date: 02/26/2022 DOB: 08/06/1955  Age: 66 y.o. MRN#: [id number] Attending Physician: Enzo Bi, MD Primary Care Physician: Michela Pitcher, NP Admit Date: 02/25/2022  Reason for Consultation/Follow-up: Establishing goals of care  Patient Profile/HPI:   66 y.o. male  with past medical history of traumatic brain in [place], dysphagia, BPH, HTN, hospitalization in March of this year for aspiration pneumonia-   admitted on 02/25/2022 with recurrent aspiration pneumonia. Swallow evaluation is pending. Palliative consulted for [place].    Subjective: Chart reviewed including labs, progress notes, imaging.  Patient is awake alert and oriented- his sister is at bedside.  They are awaiting visit from MD.  We discussed his dysphagia issues. He asked how long he would need to be NPO. Discussed this depends on his goals of care. If his swallow does not improve- then he would need to consider options of feeding tube and continued NPO status- vs continuing oral intake with understanding he is likely to continue to aspirate and have recurrent pneumonias, likely until his end of life.  Austin Conner and his sister understand the negative aspects of feeding tubes- Austin Conner describes how they can lead to diarrhea, which in turn increases his risk of pressure ulcers. We also discussed there remains continued risk of aspiration even with feeding tubes.  When asked his initial feelings about this information- Austin Conner notes that he has worked with SLP in the past on exercises to strengthen his swallow, and precautions to avoid aspiration.  He would like to continue to treat his current pneumonia in hopes this will allow him to strengthen and improve his swallow. He wants to make all  attempts to avoid a feeding tube if possible- but would agree to one as a last resort.  He does feel stronger today and notes that his secretions have decreased. He also feels his breathing is much improved.    Review of Systems  Respiratory:  Negative for sputum production and shortness of breath.      Physical Exam Vitals and nursing note reviewed.  Constitutional:      Comments: frail  Skin:    General: Skin is warm and dry.  Neurological:     Mental Status: He is alert and oriented to person, place, and  time. Mental status is at baseline.             Vital Signs: BP (!) 164/92 (BP Location: Left Arm)   Pulse (!) 111   Temp 98.4 F (36.9 C) (Oral)   Resp 18   Ht '6\' 4"'$  (1.93 m)   Wt 126.4 kg   SpO2 98%   BMI 33.92 kg/m  SpO2: SpO2: 98 % O2 Device: O2 Device: Nasal Cannula O2 Flow Rate: O2 Flow Rate (L/min): 3 L/min  Intake/output summary:  Intake/Output Summary (Last 24 hours) at 02/26/2022 1356 Last data filed at 02/26/2022 1158 Gross per 24 hour  Intake 250 ml  Output 750 ml  Net -500 ml   LBM: Last BM Date : 02/22/22 Baseline Weight: Weight: 126.4 kg (09/2021) Most recent weight: Weight: 126.4 kg       Palliative Assessment/Data: PPS: 10%      Patient Active Problem List   Diagnosis Date Noted   Acute respiratory failure ([place]) 02/25/2022   Iron deficiency anemia 01/16/2022   Head trauma 01/12/2022   Abnormal CBC 11/22/2021   Muscle spasticity 11/21/2021   Shortness of breath    Weakness    Obesity (BMI 30-39.9) 09/26/2021   Debility 09/26/2021   Aspiration pneumonia ([place]) 09/26/2021   Dysphagia 09/26/2021   Essential hypertension 09/26/2021   Hypokalemia 09/26/2021   Syncope 09/25/2021   Spastic hemiplegia affecting left nondominant side ([place]) 08/28/2020   Aortic root dilatation (HCC) 07/30/2020   Multiple thyroid nodules 01/12/2017   Adenomatous polyp 12/25/2016   History of seizure 05/21/2016   Quadriparesis (muscle weakness) 05/13/2012    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[place]) 08/14/2011   Gastro-esophageal reflux disease without esophagitis 07/05/2011   Benign prostatic hyperplasia without lower urinary tract symptoms 08/11/2010    Palliative Care Assessment & Plan    Assessment/Recommendations/Plan  Continue current care Patient is hopeful to avoid feeding tube- but would agree to it as a last resort- see above discussion  Addendum- per SLP dysphagia is not likely to improve- I had further discussion with patient regarding his wishes to eat and drink- we discussed he could have food and drink, but would need to accept risk that he is likely to continue to aspirate and inflame his lungs- which necessitates more discussion regarding his overall goals of care care and wishes regarding escalation of treatments, artificial life support and code status.  He shared that he has had very good care since 1977. He notes the worst time for him was when he had to stay in a nursing facility and did not have attentive nursing care and was left to lie in his BM for almost an hour. He is grateful for the care his sister and family have provided for him.  He said he wanted to eat and drink, he was willing to accept the risk- he noted that he would not want a feeding tube. He stated he would not want to be on life support, and would not want resuscitation. He wishes to close his eyes when it is his time to go and see his Mother and Father who have preceded him in death. He shared that he doesn't dread anything about death.  Unfortunately, his sister was not present for this conversation. I was unable to call her as she left her phone in the room. I don't recommend making any changes in his plan of care without being able to notify his sister as she was in disagreement with him yesterday.  Code Status: Full code  Prognosis:  Unable to determine  Discharge Planning: Home with Palliative Services  Care plan was discussed with patient  and family.   Thank you for allowing the Palliative Medicine Team to assist in the care of this patient.      Greater than 50%  of this time was spent counseling and coordinating care related to the above assessment and plan.  Austin Conner, AGNP-C Palliative Medicine   Please contact Palliative Medicine Team phone at [phone] for questions and concerns.

## 2022-02-26 NOTE — Assessment & Plan Note (Signed)
--  measured up to 4.0 cm. --annual imaging f/u by CTA or MRA

## 2022-02-26 NOTE — Plan of Care (Signed)
  Problem: Education: Goal: Knowledge of General Education information will improve Description Including pain rating scale, medication(s)/side effects and non-pharmacologic comfort measures Outcome: Progressing   Problem: Health Behavior/Discharge Planning: Goal: Ability to manage health-related needs will improve Outcome: Progressing   

## 2022-02-26 NOTE — Progress Notes (Signed)
PHARMACY - PHYSICIAN COMMUNICATION CRITICAL VALUE ALERT - BLOOD CULTURE IDENTIFICATION (BCID)  Austin Conner is an 66 y.o. male who presented to [facility] on 02/25/2022 with a chief complaint of respiratory distress  Assessment:  8/2 blood cx with GPC in 1 of 4 bottles.  BCID detects staphylococcus species (Not S aureus or epidermidis).  On antibiotic for aspiration pneumonia  Name of physician (or Provider) Contacted: Dr Billie Ruddy  Current antibiotics: ampicillin/sulbactam  Changes to prescribed antibiotics recommended:  Recommendations accepted by provider Suspect blood culture in contaminant, monitor of current therapy  Results for orders placed or performed during the hospital encounter of 02/25/22  Blood Culture ID Panel (Reflexed) (Collected: 02/25/2022  4:31 PM)  Result Value Ref Range   Enterococcus faecalis NOT DETECTED NOT DETECTED   Enterococcus Faecium NOT DETECTED NOT DETECTED   Listeria monocytogenes NOT DETECTED NOT DETECTED   Staphylococcus species DETECTED (A) NOT DETECTED   Staphylococcus aureus (BCID) NOT DETECTED NOT DETECTED   Staphylococcus epidermidis NOT DETECTED NOT DETECTED   Staphylococcus lugdunensis NOT DETECTED NOT DETECTED   Streptococcus species NOT DETECTED NOT DETECTED   Streptococcus agalactiae NOT DETECTED NOT DETECTED   Streptococcus pneumoniae NOT DETECTED NOT DETECTED   Streptococcus pyogenes NOT DETECTED NOT DETECTED   A.calcoaceticus-baumannii NOT DETECTED NOT DETECTED   Bacteroides fragilis NOT DETECTED NOT DETECTED   Enterobacterales NOT DETECTED NOT DETECTED   Enterobacter cloacae complex NOT DETECTED NOT DETECTED   Escherichia coli NOT DETECTED NOT DETECTED   Klebsiella aerogenes NOT DETECTED NOT DETECTED   Klebsiella oxytoca NOT DETECTED NOT DETECTED   Klebsiella pneumoniae NOT DETECTED NOT DETECTED   Proteus species NOT DETECTED NOT DETECTED   Salmonella species NOT DETECTED NOT DETECTED   Serratia marcescens NOT DETECTED NOT DETECTED    Haemophilus influenzae NOT DETECTED NOT DETECTED   Neisseria meningitidis NOT DETECTED NOT DETECTED   Pseudomonas aeruginosa NOT DETECTED NOT DETECTED   Stenotrophomonas maltophilia NOT DETECTED NOT DETECTED   Candida albicans NOT DETECTED NOT DETECTED   Candida auris NOT DETECTED NOT DETECTED   Candida glabrata NOT DETECTED NOT DETECTED   Candida krusei NOT DETECTED NOT DETECTED   Candida parapsilosis NOT DETECTED NOT DETECTED   Candida tropicalis NOT DETECTED NOT DETECTED   Cryptococcus neoformans/gattii NOT DETECTED NOT DETECTED    Doreene Eland, PharmD, BCPS, BCIDP Work Cell: [phone] 02/26/2022 1:34 PM

## 2022-02-27 ENCOUNTER — Telehealth: Payer: Self-pay | Admitting: Nurse Practitioner

## 2022-02-27 ENCOUNTER — Inpatient Hospital Stay: Payer: Medicare Other | Admitting: Radiology

## 2022-02-27 DIAGNOSIS — J9601 Acute respiratory failure with hypoxia: Secondary | ICD-10-CM | POA: Diagnosis not present

## 2022-02-27 HISTORY — PX: IR GASTROSTOMY TUBE MOD SED: IMG625

## 2022-02-27 LAB — CBC
HCT: 36.2 % — ABNORMAL LOW (ref 39.0–52.0)
Hemoglobin: 11.4 g/dL — ABNORMAL LOW (ref 13.0–17.0)
MCH: 27.5 pg (ref 26.0–34.0)
MCHC: 31.5 g/dL (ref 30.0–36.0)
MCV: 87.4 fL (ref 80.0–100.0)
Platelets: 265 10*3/uL (ref 150–400)
RBC: 4.14 MIL/uL — ABNORMAL LOW (ref 4.22–5.81)
RDW: 17.7 % — ABNORMAL HIGH (ref 11.5–15.5)
WBC: 9.5 10*3/uL (ref 4.0–10.5)
nRBC: 0 % (ref 0.0–0.2)

## 2022-02-27 LAB — BASIC METABOLIC PANEL
Anion gap: 9 (ref 5–15)
BUN: 8 mg/dL (ref 8–23)
CO2: 31 mmol/L (ref 22–32)
Calcium: 8.5 mg/dL — ABNORMAL LOW (ref 8.9–10.3)
Chloride: 104 mmol/L (ref 98–111)
Creatinine, Ser: <0.3 mg/dL — ABNORMAL LOW (ref 0.61–1.24)
Glucose, Bld: 68 mg/dL — ABNORMAL LOW (ref 70–99)
Potassium: 2.9 mmol/L — ABNORMAL LOW (ref 3.5–5.1)
Sodium: 144 mmol/L (ref 135–145)

## 2022-02-27 LAB — GLUCOSE, CAPILLARY: Glucose-Capillary: 61 mg/dL — ABNORMAL LOW (ref 70–99)

## 2022-02-27 LAB — MAGNESIUM: Magnesium: 1.9 mg/dL (ref 1.7–2.4)

## 2022-02-27 MED ORDER — SODIUM CHLORIDE 0.9 % IV SOLN: INTRAVENOUS | Status: DC | PRN

## 2022-02-27 MED ORDER — DEXTROSE 50 % IV SOLN
INTRAVENOUS | Status: AC
  Filled 2022-02-27: qty 50

## 2022-02-27 MED ORDER — MIDAZOLAM HCL 2 MG/2ML IJ SOLN
INTRAMUSCULAR | Status: AC
  Filled 2022-02-27: qty 2

## 2022-02-27 MED ORDER — LIDOCAINE HCL 1 % IJ SOLN
INTRAMUSCULAR | Status: AC
  Filled 2022-02-27: qty 20

## 2022-02-27 MED ORDER — MIDAZOLAM HCL 2 MG/2ML IJ SOLN
INTRAMUSCULAR | Status: AC | PRN
  Administered 2022-02-27 (×2): .5 mg via INTRAVENOUS

## 2022-02-27 MED ORDER — FENTANYL CITRATE (PF) 100 MCG/2ML IJ SOLN
INTRAMUSCULAR | Status: AC | PRN
  Administered 2022-02-27 (×2): 25 ug via INTRAVENOUS

## 2022-02-27 MED ORDER — KATE FARMS STANDARD 1.4 PO LIQD
406.0000 mL | Freq: Four times a day (QID) | ORAL | Status: DC
  Filled 2022-02-27: qty 650

## 2022-02-27 MED ORDER — GLUCAGON HCL RDNA (DIAGNOSTIC) 1 MG IJ SOLR
INTRAMUSCULAR | Status: AC
  Filled 2022-02-27: qty 1

## 2022-02-27 MED ORDER — LIDOCAINE VISCOUS HCL 2 % MT SOLN
OROMUCOSAL | Status: AC
  Filled 2022-02-27: qty 15

## 2022-02-27 MED ORDER — FREE WATER
210.0000 mL | Freq: Four times a day (QID) | Status: DC
  Administered 2022-02-28 – 2022-03-02 (×10): 210 mL

## 2022-02-27 MED ORDER — PROSOURCE TF PO LIQD
45.0000 mL | Freq: Two times a day (BID) | ORAL | Status: DC
  Administered 2022-02-28 – 2022-03-02 (×4): 45 mL
  Filled 2022-02-27: qty 45

## 2022-02-27 MED ORDER — OSMOLITE 1.2 CAL PO LIQD: 1000.0000 mL | ORAL | Status: DC

## 2022-02-27 MED ORDER — FENTANYL CITRATE (PF) 100 MCG/2ML IJ SOLN
INTRAMUSCULAR | Status: AC
  Filled 2022-02-27: qty 2

## 2022-02-27 MED ORDER — IOHEXOL 350 MG/ML SOLN
8.0000 mL | Freq: Once | INTRAVENOUS | Status: AC | PRN
Start: 2022-02-27 — End: 2022-02-27
  Administered 2022-02-27: 8 mL

## 2022-02-27 MED ORDER — POTASSIUM CHLORIDE 10 MEQ/100ML IV SOLN
10.0000 meq | INTRAVENOUS | Status: AC
  Administered 2022-02-27 (×5): 10 meq via INTRAVENOUS
  Filled 2022-02-27 (×5): qty 100

## 2022-02-27 MED ORDER — CEFAZOLIN SODIUM-DEXTROSE 2-4 GM/100ML-% IV SOLN
2.0000 g | Freq: Once | INTRAVENOUS | Status: AC
  Administered 2022-02-27: 2 g via INTRAVENOUS

## 2022-02-27 MED ORDER — GLUCAGON HCL RDNA (DIAGNOSTIC) 1 MG IJ SOLR
INTRAMUSCULAR | Status: AC | PRN
  Administered 2022-02-27: 1 mg via INTRAVENOUS

## 2022-02-27 NOTE — Progress Notes (Signed)
PROGRESS NOTE    Austin Conner  AST:[id number] DOB: [birth year] DOA: 02/25/2022 PCP: Michela Pitcher, NP  [id number]  LOS: 2 days   Brief hospital course: No notes on file  Assessment & Plan: Austin Conner is a 66 y.o. 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 Patient's sister states that he was recently started on nystatin for oral thrush and she noted that he had increased secretions during the day.  Overnight patient developed shortness of breath and respiratory distress and so EMS was called.     * Acute respiratory failure (HCC) secondary to aspiration pneumonitis.   Patient was noted to have room air pulse oximetry of 81% associated with tachypnea and accessory muscle use. He was initially placed on a nonrebreather mask by EMS but is currently on 7 L of oxygen via nasal cannula to maintain pulse oximetry greater than 92% Plan: --Continue supplemental O2 to keep sats >=90%, wean as tolerated  Aspiration pneumonia ([place]) In a patient with a known history of traumatic brain injury with complications of dysphagia who presents for evaluation of respiratory distress, hypoxia.  CT chest showed Bilateral lower lobe consolidations concerning for multifocal pneumonia, likely related to aspiration given debris in the right lower lobe bronchus. Started patient on IV Unasyn --SLP and modified barium swallow eval found silent aspiration. Plan: --cont NPO --cont Unasyn  Dysphagia Patient has a history of dysphagia following his traumatic brain injury and is usually on a chopped diet. --SLP and modified barium swallow eval found silent aspiration. Plan: --cont NPO --PEG tube placement today --nutrition consult for tube feed  Essential hypertension Hold oral anti hyperpentive meds due to NPO  status Plan: --IV metop 5 mg q6h  Hypokalemia --monitor and replete PRN  Thoracic aortic aneurysm (HCC) --measured up to 4.0 cm. --annual imaging f/u by CTA or MRA    DVT prophylaxis: Lovenox SQ Code Status: Full code  Family Communication: sister updated at bedside today Level of care: Progressive Dispo:   The patient is from: home Anticipated d/c is to: home Anticipated d/c date is: 2-3 days Patient currently is not medically ready to d/c due to: need to start tube feed   Subjective and Interval History:  Pt asked for water and ice, however, after discussing the risks of aspiration, sister did not want pt to have anything oral.  Pt and sister decided on having PEG tube placement for feeding.  IR placed today.   Objective: Vitals:   02/27/22 1612 02/27/22 1620 02/27/22 1630 02/27/22 1654  BP: (!) 141/89 135/80  129/74  Pulse: (!) 104 (!) 101 99 96  Resp: (!) '24 17 16 18  '$ Temp:    98.3 F (36.8 C)  TempSrc:      SpO2: 98% 98% 98% 100%  Weight:      Height:        Intake/Output Summary (Last 24 hours) at 02/27/2022 1915 Last data filed at 02/27/2022 1500 Gross per 24 hour  Intake 1080.88 ml  Output 1700 ml  Net -619.12 ml   Filed Weights   02/25/22 0830 02/26/22 0500  Weight: 126.4 kg 126.4 kg    Examination:   Constitutional: NAD, AAOx3 HEENT: conjunctivae and lids normal, EOMI CV: No cyanosis.   RESP: normal respiratory effort SKIN: warm, dry   Data Reviewed: I have personally reviewed labs and imaging studies  Time spent: 50 minutes  Enzo Bi, MD [facility] If 7PM-7AM, please contact night-coverage 02/27/2022, 7:15 PM

## 2022-02-27 NOTE — Progress Notes (Signed)
SLP Follow up Note  Patient Details Name: MEKAI WILKINSON MRN: [id number] DOB: 08/02/1955  ST services are following pt's chart with MD to notify ST should become a candidate for any further instrumental swallow studies.   Shontia Gillooly B. Rutherford Nail, M.S., CCC-SLP, Mining engineer Certified Brain Injury [place]  [place] Office [phone] Ascom [phone] Fax [phone]

## 2022-02-27 NOTE — Telephone Encounter (Signed)
Austin Conner with [facility] called and said they have been working with patient. She said that the hospital wanted him to be seen again for several things, she said now they are only letting Mds follow for patients not so she wanted to know if the supervising provider would be okay following patient. Please advise. Call back is [phone].

## 2022-02-27 NOTE — Care Management Important Message (Signed)
Important Message  Patient Details  Name: Austin Conner MRN: [id number] Date of Birth: Mar 26, 1956   Medicare Important Message Given:  Yes  I reviewed the Important Message from Medicare with the patients HCPOA, [name], sister by phone [phone] and she is aware of the his rights. I asked if she would like a copy and she replied yes. I have sent a copy via secure e-mail to [email or web address] and thanked her for her time.   Juliann Pulse A Anthonella Klausner 02/27/2022, 3:20 PM

## 2022-02-27 NOTE — Progress Notes (Signed)
Initial Nutrition Assessment  DOCUMENTATION CODES:   Obesity unspecified  INTERVENTION:   Once tube is clear for use, recommend:  406 ml (1.25 cartons) [name] Standard 1.4 4 times daily via g-tube   45 ml Prosource TF BID.  105 ml free water flush before and after each feeding administration    Tube feeding regimen provides 2315 kcal (100% of needs), 122 grams of protein, and 1170 ml of H2O. Total free water: 2010 ml daily  NUTRITION DIAGNOSIS:   Inadequate oral intake related to inability to eat, dysphagia as evidenced by NPO status.  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  Pt admitted with acute respiratory failure and aspiration pneumonia.   8/2- s/p MBSS- NPO  Reviewed I/O's: -1.9 L x 24 hours and -775 ml since admission  UOP: 2.4 L x 24 hours  Stool output: 0 ml x 24 hours  Case discussed with Dr. Billie Ruddy. Pt and sister are agreeable to PEG placement, which will happen this afternoon. Per Dr. Billie Ruddy, pt sister is very concerned about pt using milk products and would like to discuss feeding options with RD.   Spoke with pt and sister at bedside. Both are in agreement for PEG placement today. Per sister, pt has had long-standing dysphagia issues and typically eats a mechanical soft, chopped diet, however, intake has progressively declined due to worsening dysphagia. Pt sister shares that pt is eating less and less and is concerned that pt is not receiving the nutrition he needs and feels like PEG is the best option to help pt support his nutritional status; pt is also agreement to plan.   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 Pt recently relocated to the area from [place] and was living in [place] up until April 2023; he now resides at home with this sister. Sister reports that she worked with an RD up in [place] to help maximize pt's PO intake secondary to dysphagia and food intolerances. Pt has difficulty with any type of milk and lactose products, including milk and yogurt; pt will develop large secretions and diarrhea when consuming these. Pt is able to tolerate dairy free substitutes like almond milk with minimal success; the only type of dairy type product that pt has been able to tolerate without issue is the So Delicious coconut yogurt. Due to large issues with food intolerances, pt sister is wary about using a standard commercial tube feeding formula due to fear of poor tolerance and developing diarrhea. RD reviewed available options on formulary with her. Pt sister would like to try [facility] Standard 1.4, which contains no milk, eggs, fish, shellfish, tree nuts, peanuts, wheat, soy beans, and sesame seeds.   Per sister, pt weighed about 260# after discharge from [facility] in April and is unsure if pt has lost weight. Noted multiple similar weight in weight history, so suspect recent wt may be stated weight. RD will order weight to assess for acute weight changes.   Medications reviewed.  Labs reviewed: K: 2.9, CBGS: 113 (inpatient orders for glycemic control are none).    NUTRITION - FOCUSED PHYSICAL EXAM:  Flowsheet Row Most Recent Value  Orbital Region No depletion  Upper Arm Region No depletion  Thoracic and Lumbar Region No depletion  Buccal Region No depletion  Temple Region No depletion  Clavicle Bone Region Mild depletion  Clavicle and Acromion Bone Region No depletion  Scapular Bone Region No depletion  Dorsal Hand No depletion  Patellar Region No depletion  Anterior Thigh Region No depletion  Posterior Calf Region No depletion  Edema  (RD Assessment) Mild  Hair Reviewed  Eyes Reviewed  Mouth Reviewed  Skin Reviewed  Nails Reviewed       Diet Order:   Diet Order             Diet NPO time specified  Diet effective now                   EDUCATION NEEDS:   Education needs have been addressed  Skin:  Skin Assessment: Reviewed RN Assessment  Last BM:  02/22/22  Height:   Ht Readings from Last 1 Encounters:  02/25/22 '6\' 4"'$  (1.93 m)    Weight:   Wt Readings from Last 1 Encounters:  02/26/22 126.4 kg    Ideal Body Weight:  91.8 kg  BMI:  Body mass index is 33.92 kg/m.  Estimated Nutritional Needs:   Kcal:  2100-2300  Protein:  110-125 grams  Fluid:  > 2 L    Loistine Chance, RD, LDN, [place] Registered Dietitian II Certified Diabetes Care and Education Specialist Please refer to [facility] for RD and/or RD on-call/weekend/after hours pager

## 2022-02-27 NOTE — Telephone Encounter (Signed)
Happy to sign things and ok verbal orders as needed

## 2022-02-27 NOTE — Plan of Care (Signed)

## 2022-02-27 NOTE — Telephone Encounter (Signed)
Got clarification, HH patient's orders and notes have to be signed and followed/approved by [facility] supervising provider which is Dr Glori Bickers, they are not able to accept NPs or PAs signatures or verbal orders on patients. This has always been like this but changed some since Covid and not been as strict and now going back to that rule. Sending to [facility] and Dr Glori Bickers to get an ok if it is.

## 2022-02-27 NOTE — Progress Notes (Signed)
Patient clinically stable post PEG placement 20 FR per Dr Dwaine Gale, tolerated well. Received Versed 1 mg along with Fentanyl 50 mcg IV for procedure. Report given to [facility] post procedure. Awake/alert and oriented post procedure

## 2022-02-27 NOTE — Procedures (Signed)
Interventional Radiology Procedure Note  Procedure: 20 fr PEG tube placement  Indication: Dysphagia  Findings: Please refer to procedural dictation for full description.  Complications: None  EBL: < 10 mL  Miachel Roux, MD [phone]

## 2022-02-27 NOTE — TOC Initial Note (Addendum)
Transition of Care [facility]) - Initial/Assessment Note    Patient Details  Name: Austin Conner MRN: [id number] Date of Birth: 12-22-1955  [facility] of [facility]) CM/SW Contact:    Alberteen Sam, LCSW Phone Number: 02/27/2022, 8:38 AM  Clinical Narrative:                  CSW spoke with patient's sister [name] who reports she is in agreement with [facility] PT recs and prefers to stay with Renold Don at [facility] updated they are able to accept patient for [facility] PT and OT, speech and aide.   Per sister [name], she reports they have all dme at home however would be interested in a suction machine for secretions if possible, CSW has reached out to [place] with [employer] to inquire, pending response.   Expected Discharge Plan: [place] Barriers to Discharge: Continued Medical Work up   Patient Goals and CMS Choice Patient states their goals for this hospitalization and ongoing recovery are:: to go home CMS Medicare.gov Compare Post Acute Care list provided to:: Patient Choice offered to / list presented to : Patient  Expected Discharge Plan and Services Expected Discharge Plan: [place]       Living arrangements for the past 2 months: Single Family Home                           HH Arranged: PT, RN [facility] Agency: [facility] Date [facility] Agency Contacted: 02/27/22 Time [place]: [phone] Representative spoke with at [place]: Merleen Nicely  Prior Living Arrangements/Services Living arrangements for the past 2 months: [place] with:: Siblings   Do you feel safe going back to the place where you live?: Yes               Activities of Daily Living      Permission Sought/Granted                  Emotional Assessment              Admission diagnosis:  Acute respiratory failure ([place]) [J96.00] Shortness of breath [R06.02] Acute respiratory failure with hypoxia ([place]) [J96.01] Patient Active Problem List   Diagnosis  Date Noted   Thoracic aortic aneurysm ([place]) 02/26/2022   Acute respiratory failure ([place]) 02/25/2022   Iron deficiency anemia 01/16/2022   Head trauma 01/12/2022   Abnormal CBC 11/22/2021   Muscle spasticity 11/21/2021   Shortness of breath    Weakness    Obesity (BMI 30-39.9) 09/26/2021   Debility 09/26/2021   Aspiration pneumonia ([place]) 09/26/2021   Dysphagia 09/26/2021   Essential hypertension 09/26/2021   Hypokalemia 09/26/2021   Syncope 09/25/2021   Spastic hemiplegia affecting left nondominant side ([place]) 08/28/2020   Aortic root dilatation ([place]) 07/30/2020   Multiple thyroid nodules 01/12/2017   Adenomatous polyp 12/25/2016   History of seizure 05/21/2016   Quadriparesis (muscle weakness) 05/13/2012   

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
## 2022-02-27 NOTE — Progress Notes (Signed)
Physical Therapy Treatment Patient Details Name: Austin KOLODZIEJSKI MRN: [id number] DOB: [birth year] Today's Date: 02/27/2022   History of Present Illness Pt is a 66 y.o. male presenting to hospital 8/2 with respiratory distress.  Pt admitted with acute respiratory failure, aspiration PNA, and dysphagia.  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    PT Comments    Pt presenting in good spirits. He participates in LE stretching and strengthening exercises this session, as well as UE strengthening exercises. PT will continue to follow while in house to reduce the risks associated with immobility.    Recommendations for follow up therapy are one component of a multi-disciplinary discharge planning process, led by the attending physician.  Recommendations may be updated based on patient status, additional functional criteria and insurance authorization.  Follow Up Recommendations  Home health PT     Assistance Recommended at Discharge Frequent or constant Supervision/Assistance  Patient can return home with the following Two people to help with walking and/or transfers;Two people to help with bathing/dressing/bathroom;Assistance with cooking/housework;Assistance with feeding;Assist for transportation;Help with stairs or ramp for entrance   Equipment Recommendations  Other (comment)    Recommendations for Other Services       Precautions / Restrictions Precautions Precautions: Fall;Shoulder Type of Shoulder Precautions: Shoulder subluxation Precaution Booklet Issued: No Restrictions Weight Bearing Restrictions: No     Mobility  Bed Mobility                    Transfers                        Ambulation/Gait                   Stairs             Wheelchair Mobility    Modified Rankin (Stroke Patients Only)       Balance                                             Cognition Arousal/Alertness: Awake/alert Behavior During Therapy: WFL for tasks assessed/performed Overall Cognitive Status: Within Functional Limits for tasks assessed                                          Exercises General Exercises - Upper Extremity Elbow Flexion: 10 reps, Both, AAROM, Strengthening, Supine Elbow Extension: PROM, Left, 10 reps, Supine General Exercises - Lower Extremity Ankle Circles/Pumps: Both, PROM, AROM, 10 reps, Supine (2x10 reps BLE) Heel Slides: AAROM, Strengthening, Both, 10 reps Hip ABduction/ADduction: AAROM, Strengthening, 10 reps, Both, Supine Toe Raises: Supine, AROM, 10 reps, Right (resisted plantarflexion on R)    General Comments        Pertinent Vitals/Pain Pain Assessment Pain Assessment: No/denies pain    Home Living                          Prior Function            PT Goals (current goals can now be found in the care plan section) Acute Rehab PT Goals Patient Stated Goal: to maintain and improve strength and mobility during hospitalization PT  Goal Formulation: With patient/family Time For Goal Achievement: 03/12/22 Potential to Achieve Goals: Fair Progress towards PT goals: Progressing toward goals    Frequency    Min 2X/week      PT Plan Current plan remains appropriate    Co-evaluation              AM-PAC PT "6 Clicks" Mobility   Outcome Measure  Help needed turning from your back to your side while in a flat bed without using bedrails?: A Lot Help needed moving from lying on your back to sitting on the side of a flat bed without using bedrails?: Total Help needed moving to and from a bed to a chair (including a wheelchair)?: Total Help needed standing up from a chair using your arms (e.g., wheelchair or bedside chair)?: Total Help needed to walk in hospital room?: Total Help needed climbing 3-5 steps with a railing? : Total 6 Click Score: 7    End of  Session Equipment Utilized During Treatment: Oxygen Activity Tolerance: Patient tolerated treatment well Patient left: in bed;with call bell/phone within reach;with family/visitor present;with bed alarm set Nurse Communication: Mobility status PT Visit Diagnosis: Muscle weakness (generalized) (M62.81);Other abnormalities of gait and mobility (R26.89)     Time: 1282-0813 PT Time Calculation (min) (ACUTE ONLY): 27 min  Charges:  $Therapeutic Exercise: 23-37 mins                     10:21 AM, 02/27/22 Zionah Criswell A. Saverio Danker PT, DPT Physical Therapist - [facility]    Egor Fullilove A Prakash Kimberling 02/27/2022, 10:19 AM

## 2022-02-27 NOTE — Telephone Encounter (Signed)
Austin Conner advised

## 2022-02-28 DIAGNOSIS — J9601 Acute respiratory failure with hypoxia: Secondary | ICD-10-CM | POA: Diagnosis not present

## 2022-02-28 LAB — CULTURE, BLOOD (ROUTINE X 2)

## 2022-02-28 LAB — BASIC METABOLIC PANEL
Anion gap: 9 (ref 5–15)
BUN: 8 mg/dL (ref 8–23)
CO2: 33 mmol/L — ABNORMAL HIGH (ref 22–32)
Calcium: 8.7 mg/dL — ABNORMAL LOW (ref 8.9–10.3)
Chloride: 105 mmol/L (ref 98–111)
Creatinine, Ser: <0.3 mg/dL — ABNORMAL LOW (ref 0.61–1.24)
Glucose, Bld: 100 mg/dL — ABNORMAL HIGH (ref 70–99)
Potassium: 2.9 mmol/L — ABNORMAL LOW (ref 3.5–5.1)
Sodium: 147 mmol/L — ABNORMAL HIGH (ref 135–145)

## 2022-02-28 LAB — GLUCOSE, CAPILLARY
Glucose-Capillary: 101 mg/dL — ABNORMAL HIGH (ref 70–99)
Glucose-Capillary: 110 mg/dL — ABNORMAL HIGH (ref 70–99)
Glucose-Capillary: 116 mg/dL — ABNORMAL HIGH (ref 70–99)
Glucose-Capillary: 121 mg/dL — ABNORMAL HIGH (ref 70–99)
Glucose-Capillary: 151 mg/dL — ABNORMAL HIGH (ref 70–99)
Glucose-Capillary: 94 mg/dL (ref 70–99)
Glucose-Capillary: 96 mg/dL (ref 70–99)
Glucose-Capillary: 97 mg/dL (ref 70–99)

## 2022-02-28 LAB — CBC
HCT: 36.2 % — ABNORMAL LOW (ref 39.0–52.0)
Hemoglobin: 11.5 g/dL — ABNORMAL LOW (ref 13.0–17.0)
MCH: 27.6 pg (ref 26.0–34.0)
MCHC: 31.8 g/dL (ref 30.0–36.0)
MCV: 87 fL (ref 80.0–100.0)
Platelets: 256 10*3/uL (ref 150–400)
RBC: 4.16 MIL/uL — ABNORMAL LOW (ref 4.22–5.81)
RDW: 17.4 % — ABNORMAL HIGH (ref 11.5–15.5)
WBC: 7 10*3/uL (ref 4.0–10.5)
nRBC: 0 % (ref 0.0–0.2)

## 2022-02-28 LAB — C-REACTIVE PROTEIN: CRP: 6.2 mg/dL — ABNORMAL HIGH (ref <1.0)

## 2022-02-28 LAB — MAGNESIUM: Magnesium: 1.9 mg/dL (ref 1.7–2.4)

## 2022-02-28 LAB — SEDIMENTATION RATE: Sed Rate: 1 mm/hr (ref 0–20)

## 2022-02-28 MED ORDER — METOPROLOL TARTRATE 50 MG PO TABS
50.0000 mg | ORAL_TABLET | Freq: Two times a day (BID) | ORAL | Status: DC
  Administered 2022-02-28 – 2022-03-02 (×4): 50 mg
  Filled 2022-02-28 (×4): qty 1

## 2022-02-28 MED ORDER — METHYLPREDNISOLONE SODIUM SUCC 40 MG IJ SOLR
40.0000 mg | INTRAMUSCULAR | Status: DC
Start: 2022-02-28 — End: 2022-03-02
  Administered 2022-02-28 – 2022-03-01 (×2): 40 mg via INTRAVENOUS
  Filled 2022-02-28 (×2): qty 1

## 2022-02-28 MED ORDER — ENOXAPARIN SODIUM 60 MG/0.6ML IJ SOSY
0.5000 mg/kg | PREFILLED_SYRINGE | INTRAMUSCULAR | Status: DC
  Administered 2022-03-01 – 2022-03-02 (×2): 52.5 mg via SUBCUTANEOUS
  Filled 2022-02-28 (×2): qty 0.6

## 2022-02-28 MED ORDER — KATE FARMS STANDARD 1.4 PO LIQD
406.0000 mL | Freq: Four times a day (QID) | ORAL | Status: DC
  Administered 2022-02-28 – 2022-03-02 (×11): 406 mL
  Filled 2022-02-28: qty 650

## 2022-02-28 MED ORDER — DEXTROSE 50 % IV SOLN
25.0000 mL | INTRAVENOUS | Status: AC
  Administered 2022-02-28: 25 mL via INTRAVENOUS

## 2022-02-28 MED ORDER — DEXTROSE 10 % IV SOLN: INTRAVENOUS | Status: AC

## 2022-02-28 MED ORDER — PANTOPRAZOLE 2 MG/ML SUSPENSION
20.0000 mg | Freq: Two times a day (BID) | ORAL | Status: DC
Start: 2022-02-28 — End: 2022-03-02
  Administered 2022-02-28 – 2022-03-02 (×4): 20 mg
  Filled 2022-02-28 (×5): qty 20

## 2022-02-28 MED ORDER — SODIUM CHLORIDE 3 % IN NEBU
4.0000 mL | INHALATION_SOLUTION | Freq: Two times a day (BID) | RESPIRATORY_TRACT | Status: DC
  Administered 2022-02-28 – 2022-03-02 (×4): 4 mL via RESPIRATORY_TRACT
  Filled 2022-02-28 (×5): qty 4

## 2022-02-28 MED ORDER — TIZANIDINE HCL 2 MG PO TABS
6.0000 mg | ORAL_TABLET | Freq: Three times a day (TID) | ORAL | Status: DC
  Administered 2022-02-28 – 2022-03-02 (×5): 6 mg
  Filled 2022-02-28 (×8): qty 3

## 2022-02-28 MED ORDER — SALINE SPRAY 0.65 % NA SOLN
1.0000 | NASAL | Status: DC | PRN
  Administered 2022-02-28: 1 via NASAL
  Filled 2022-02-28: qty 44

## 2022-02-28 MED ORDER — POTASSIUM CHLORIDE 20 MEQ PO PACK
40.0000 meq | PACK | ORAL | Status: AC
  Administered 2022-02-28 (×2): 40 meq
  Filled 2022-02-28 (×2): qty 2

## 2022-02-28 MED ORDER — DULOXETINE HCL 30 MG PO CPEP: 60.0000 mg | ORAL_CAPSULE | Freq: Every day | ORAL | Status: DC
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
## 2022-02-28 NOTE — Plan of Care (Signed)
  Problem: Clinical Measurements: Goal: Respiratory complications will improve Outcome: Progressing Goal: Cardiovascular complication will be avoided Outcome: Progressing   Problem: Activity: Goal: Risk for activity intolerance will decrease Outcome: Not Progressing

## 2022-02-28 NOTE — TOC Progression Note (Addendum)
Transition of Care [facility]) - Progression Note    Patient Details  Name: Austin Conner MRN: [id number] Date of Birth: Aug 01, 1955  Transition of Care [facility]) CM/SW [place], LCSW Phone Number: 02/28/2022, 12:37 PM  Clinical Narrative:  PEG tube placed yesterday. Left messages for [employer] and [facility] representatives to notify. [employer] confirmed he qualifies for suctioning supplies and they are working on delivering to the home.  2:13 pm: [employer] representative will order tube feed supplies. [facility] does not have nursing availability. MD said patient should not require a nurse. Sister will do bolus feeds so MD feels it should be easy to teach that prior to discharge." [facility] representative is aware.  2:33 pm: Per MD, likely discharge Monday. Dietician will send him home with 3 days worth of tube feed formula. [employer] representative is aware. Submitted benefits check request for the [employer] formula. Probably will not receive information until Monday.  Expected Discharge Plan: [place] Barriers to Discharge: Continued Medical Work up  Expected Discharge Plan and Services Expected Discharge Plan: [place] arrangements for the past 2 months: Single Family Home                           HH Arranged: PT, RN [facility] Agency: [facility] Date [facility] Agency Contacted: 02/27/22 Time [place]: [phone] Representative spoke with at [place]: [place] Determinants of Health ([place]) Interventions    Readmission Risk Interventions     No data to display

## 2022-02-28 NOTE — Progress Notes (Signed)
Per Dr. Billie Ruddy. Okay to stop Peg tube low suction and start on tube feeds. Will continue to monitor

## 2022-02-28 NOTE — Progress Notes (Signed)
Hypoglycemic Event  CBG: 60  Treatment: D50 25 mL (12.5 gm)  Symptoms: None  Follow-up CBG: Time:2025 CBG Result:110  Possible Reasons for Event: Inadequate meal intake  Comments/MD notified:na    Joanne Gavel
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
## 2022-02-28 NOTE — Progress Notes (Signed)
SLP Follow Up Note  Patient Details Name: Austin Conner MRN: [id number] DOB: [birth year]  ST services have been following pt's chart. Per chart, pt has received PEG placement.   Please re-consult ST services when pt is appropriate for a repeat instrumental swallow study.   [name] B. Rutherford Nail, M.S., CCC-SLP, Mining engineer Certified Brain Injury [place]  [place] Office [phone] Ascom [phone] Fax [phone]

## 2022-03-01 ENCOUNTER — Inpatient Hospital Stay: Payer: Medicare Other

## 2022-03-01 DIAGNOSIS — J9601 Acute respiratory failure with hypoxia: Secondary | ICD-10-CM | POA: Diagnosis not present

## 2022-03-01 LAB — CBC
HCT: 37 % — ABNORMAL LOW (ref 39.0–52.0)
Hemoglobin: 12.1 g/dL — ABNORMAL LOW (ref 13.0–17.0)
MCH: 27.9 pg (ref 26.0–34.0)
MCHC: 32.7 g/dL (ref 30.0–36.0)
MCV: 85.3 fL (ref 80.0–100.0)
Platelets: 255 10*3/uL (ref 150–400)
RBC: 4.34 MIL/uL (ref 4.22–5.81)
RDW: 16.8 % — ABNORMAL HIGH (ref 11.5–15.5)
WBC: 5 10*3/uL (ref 4.0–10.5)
nRBC: 0 % (ref 0.0–0.2)

## 2022-03-01 LAB — GLUCOSE, CAPILLARY
Glucose-Capillary: 119 mg/dL — ABNORMAL HIGH (ref 70–99)
Glucose-Capillary: 128 mg/dL — ABNORMAL HIGH (ref 70–99)
Glucose-Capillary: 139 mg/dL — ABNORMAL HIGH (ref 70–99)
Glucose-Capillary: 153 mg/dL — ABNORMAL HIGH (ref 70–99)
Glucose-Capillary: 164 mg/dL — ABNORMAL HIGH (ref 70–99)
Glucose-Capillary: 246 mg/dL — ABNORMAL HIGH (ref 70–99)

## 2022-03-01 LAB — BASIC METABOLIC PANEL
Anion gap: 7 (ref 5–15)
BUN: 12 mg/dL (ref 8–23)
CO2: 33 mmol/L — ABNORMAL HIGH (ref 22–32)
Calcium: 8.7 mg/dL — ABNORMAL LOW (ref 8.9–10.3)
Chloride: 105 mmol/L (ref 98–111)
Creatinine, Ser: 0.31 mg/dL — ABNORMAL LOW (ref 0.61–1.24)
GFR, Estimated: >60 mL/min (ref >60)
Glucose, Bld: 131 mg/dL — ABNORMAL HIGH (ref 70–99)
Potassium: 3.9 mmol/L (ref 3.5–5.1)
Sodium: 145 mmol/L (ref 135–145)

## 2022-03-01 LAB — PHOSPHORUS: Phosphorus: 1.3 mg/dL — ABNORMAL LOW (ref 2.5–4.6)

## 2022-03-01 LAB — HEMOGLOBIN A1C
Hgb A1c MFr Bld: 4.9 % (ref 4.8–5.6)
Mean Plasma Glucose: 93.93 mg/dL

## 2022-03-01 LAB — MAGNESIUM: Magnesium: 1.8 mg/dL (ref 1.7–2.4)

## 2022-03-01 MED ORDER — POLYETHYLENE GLYCOL 3350 17 G PO PACK: 17.0000 g | PACK | Freq: Two times a day (BID) | ORAL | Status: DC | PRN

## 2022-03-01 MED ORDER — METHOCARBAMOL 500 MG PO TABS: 500.0000 mg | ORAL_TABLET | Freq: Once | ORAL | Status: DC

## 2022-03-01 MED ORDER — BISACODYL 10 MG RE SUPP
10.0000 mg | Freq: Every day | RECTAL | Status: DC | PRN
Start: 2022-03-01 — End: 2022-03-02
  Administered 2022-03-01: 10 mg via RECTAL
  Filled 2022-03-01: qty 1

## 2022-03-01 MED ORDER — INSULIN ASPART 100 UNIT/ML IJ SOLN
0.0000 [IU] | INTRAMUSCULAR | Status: DC
  Administered 2022-03-01 (×2): 3 [IU] via SUBCUTANEOUS
  Administered 2022-03-01: 5 [IU] via SUBCUTANEOUS
  Administered 2022-03-01: 2 [IU] via SUBCUTANEOUS
  Administered 2022-03-02 (×3): 3 [IU] via SUBCUTANEOUS
  Administered 2022-03-02: 2 [IU] via SUBCUTANEOUS
  Filled 2022-03-01 (×7): qty 1

## 2022-03-01 MED ORDER — DOCUSATE SODIUM 50 MG/5ML PO LIQD: 100.0000 mg | Freq: Two times a day (BID) | ORAL | Status: DC | PRN

## 2022-03-01 MED ORDER — POTASSIUM PHOSPHATES 15 MMOLE/5ML IV SOLN
30.0000 mmol | Freq: Once | INTRAVENOUS | Status: AC
  Administered 2022-03-01: 30 mmol via INTRAVENOUS
  Filled 2022-03-01: qty 10

## 2022-03-01 MED ORDER — DANTROLENE SODIUM 100 MG PO CAPS
100.0000 mg | ORAL_CAPSULE | Freq: Two times a day (BID) | ORAL | Status: DC
Start: 2022-03-01 — End: 2022-03-02
  Administered 2022-03-01 – 2022-03-02 (×2): 100 mg
  Filled 2022-03-01 (×4): qty 1
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
## 2022-03-01 NOTE — TOC Progression Note (Signed)
Transition of Care [facility]) - Progression Note    Patient Details  Name: Austin Conner MRN: [id number] Date of Birth: 01-Mar-1956  [facility]) CM/SW Contact  Austin Price, RN Phone Number: 03/01/2022, 10:29 AM  Clinical Narrative:  8/6: EDD Monday. Per prior CSW notes, set up with [facility]. DME home suction for secretions ordered and [employer] was notified. Dietician providing 3 days of tube feedings for PEG placed 8/4.  Requested [place] order by provider in anticipation of discharge Monday 03/02/22. Austin Davies RN CM    Expected Discharge Plan: [place] Barriers to Discharge: Continued Medical Work up  Expected Discharge Plan and Services Expected Discharge Plan: [place] arrangements for the past 2 months: Single Family Home                           HH Arranged: PT, RN [facility] Agency: [facility] Date [facility] Agency Contacted: 02/27/22 Time [place]: [phone] Representative spoke with at [place]: Austin Conner   Social Determinants of Health ([place]) Interventions    Readmission Risk Interventions     No data to display

## 2022-03-01 NOTE — Progress Notes (Signed)
PULMONOLOGY         Date: 03/01/2022,   MRN# [id number] KASEN ADDUCI 11/28/1955     AdmissionWeight: 126.4 kg (09/2021)                 CurrentWeight: 102.5 kg  Referring provider: Dr Billie Ruddy   CHIEF COMPLAINT:   Aspiration of foreign body   HISTORY OF PRESENT ILLNESS   This is a pleasant 66 year old male with a history of essential hypertension, aortic aneurysm, recurrent aspiration, right-sided hemiplegia history of traumatic brain injury, restless leg syndrome, history of seizures, recent episodes of aspiration status post swallow evaluation with notable microaspiration/silent aspiration and recent placement of PEG tube with PEG feeds.  Patient is here with findings of aspiration pneumonia and is being treated medically and had CT chest done with notable debris in the right lower bronchus.  There is also consolidation throughout essentially the entire right lower lobe with patchy areas of hypoenhancement.  Findings are consistent with pneumonia and aspiration into the right lower lobe.  Pulmonary consultation placed for additional evaluation of aspiration of foreign body.  03/01/22- patient s/p CXR with improvement.  He had metaneb recruitment and hypertonic saline with good expectoration of phlegm and feels less dyspneic.  He is smiling and feels better. Patient is cleared from pulmonary for dc home. If he requires bronchoscopy we can order this for outpatient.  Starting tommorow will start pred 50 with tapering by 5m daily.   PAST MEDICAL HISTORY   Past Medical History:  Diagnosis Date   BPH (benign prostatic hyperplasia)    Dysphagia    GERD (gastroesophageal reflux disease)    Hypertension    Restless leg syndrome    [place]    [place], remote; had trach but this is reversed; has ambulatory dysfunction, LUE contracture     SURGICAL HISTORY   Past Surgical History:  Procedure Laterality Date   BPH TERP     brain injury       CHOLECYSTECTOMY     HERNIA REPAIR     riht inguinal hernia   IR GASTROSTOMY TUBE MOD SED  02/27/2022     FAMILY HISTORY   Family History  Problem Relation Age of Onset   Hypertension Mother    Alzheimer's disease Mother    Hypertension Father    Hypertension Sister      SOCIAL HISTORY   Social History   Tobacco Use   Smoking status: Never   Smokeless tobacco: Never  Vaping Use   Vaping Use: Never used  Substance Use Topics   Alcohol use: Never   Drug use: Never     MEDICATIONS    Home Medication:    Current Medication:  Current Facility-Administered Medications:    0.9 %  sodium chloride infusion, , Intravenous, PRN, LEnzo Bi MD, Last Rate: 10 mL/hr at 02/27/22 2150, New Bag at 02/27/22 2150   acetaminophen (TYLENOL) tablet 650 mg, 650 mg, Oral, Q6H PRN **OR** acetaminophen (TYLENOL) suppository 650 mg, 650 mg, Rectal, Q6H PRN, Agbata, Tochukwu, MD   albuterol (PROVENTIL) (2.5 MG/3ML) 0.083% nebulizer solution 2.5 mg, 2.5 mg, Nebulization, TID PRN, Agbata, Tochukwu, MD, 2.5 mg at 03/01/22 0811   Ampicillin-Sulbactam (UNASYN) 3 g in sodium chloride 0.9 % 100 mL IVPB, 3 g, Intravenous, Q6H, LEnzo Bi MD, Last Rate: 200 mL/hr at 03/01/22 1352, 3 g at 03/01/22 1352   antiseptic oral rinse (BIOTENE) solution 15 mL, 15 mL, Mouth Rinse, QID, Mahan, Kasie J,  NP, 15 mL at 03/01/22 1343   bisacodyl (DULCOLAX) suppository 10 mg, 10 mg, Rectal, Daily PRN, Enzo Bi, MD   docusate (COLACE) 50 MG/5ML liquid 100 mg, 100 mg, Per Tube, BID PRN, Enzo Bi, MD   enoxaparin (LOVENOX) injection 52.5 mg, 0.5 mg/kg, Subcutaneous, Q24H, Chinita Greenland A, RPH, 52.5 mg at 03/01/22 1018   feeding supplement (KATE FARMS STANDARD 1.4) liquid 406 mL, 406 mL, Per Tube, QID, Enzo Bi, MD, 406 mL at 03/01/22 1343   feeding supplement (PROSource TF) liquid 45 mL, 45 mL, Per Tube, BID, Enzo Bi, MD, 45 mL at 03/01/22 1022   free water 210 mL, 210 mL, Per Tube, QID, Enzo Bi, MD, 210 mL at  03/01/22 1343   hydrALAZINE (APRESOLINE) injection 10 mg, 10 mg, Intravenous, Q6H PRN, Agbata, Tochukwu, MD   insulin aspart (novoLOG) injection 0-15 Units, 0-15 Units, Subcutaneous, Q4H, Sharion Settler, NP, 3 Units at 03/01/22 0411   methocarbamol (ROBAXIN) 500 mg in dextrose 5 % 50 mL IVPB, 500 mg, Intravenous, Q6H PRN, Agbata, Tochukwu, MD, Last Rate: 110 mL/hr at 02/25/22 2216, 500 mg at 02/25/22 2216   methylPREDNISolone sodium succinate (SOLU-MEDROL) 40 mg/mL injection 40 mg, 40 mg, Intravenous, Q24H, Lanney Gins, Omarius Grantham, MD, 40 mg at 02/28/22 1642   metoprolol tartrate (LOPRESSOR) tablet 50 mg, 50 mg, Per Tube, BID, Enzo Bi, MD, 50 mg at 03/01/22 1023   ondansetron (ZOFRAN) tablet 4 mg, 4 mg, Oral, Q6H PRN **OR** ondansetron (ZOFRAN) injection 4 mg, 4 mg, Intravenous, Q6H PRN, Agbata, Tochukwu, MD   Oral care mouth rinse, 15 mL, Mouth Rinse, 4 times per day, Agbata, Tochukwu, MD, 15 mL at 03/01/22 1244   Oral care mouth rinse, 15 mL, Mouth Rinse, PRN, Agbata, Tochukwu, MD   pantoprazole sodium (PROTONIX) 40 mg/20 mL oral suspension 20 mg, 20 mg, Per Tube, BID, Enzo Bi, MD, 20 mg at 03/01/22 1027   polyethylene glycol (MIRALAX / GLYCOLAX) packet 17 g, 17 g, Per Tube, BID PRN, Enzo Bi, MD   potassium PHOSPHATE 30 mmol in dextrose 5 % 500 mL infusion, 30 mmol, Intravenous, Once, Enzo Bi, MD, Last Rate: 85 mL/hr at 03/01/22 1335, 30 mmol at 03/01/22 1335   sodium chloride (OCEAN) 0.65 % nasal spray 1 spray, 1 spray, Each Nare, PRN, Enzo Bi, MD, 1 spray at 02/28/22 1359   sodium chloride HYPERTONIC 3 % nebulizer solution 4 mL, 4 mL, Nebulization, BID, Lanney Gins, Jun Rightmyer, MD, 4 mL at 03/01/22 1224   tiZANidine (ZANAFLEX) tablet 6 mg, 6 mg, Per Tube, TID, Enzo Bi, MD, 6 mg at 03/01/22 1132    ALLERGIES   Ciprofloxacin     REVIEW OF SYSTEMS    Review of Systems:  Gen:  Denies  fever, sweats, chills weigh loss  HEENT: Denies blurred vision, double vision, ear pain, eye pain,  hearing loss, nose bleeds, sore throat Cardiac:  No dizziness, chest pain or heaviness, chest tightness,edema Resp:   reports dyspnea chronically  Gi: Denies swallowing difficulty, stomach pain, nausea or vomiting, diarrhea, constipation, bowel incontinence Gu:  Denies bladder incontinence, burning urine Ext:   Denies Joint pain, stiffness or swelling Skin: Denies  skin rash, easy bruising or bleeding or hives Endoc:  Denies polyuria, polydipsia , polyphagia or weight change Psych:   Denies depression, insomnia or hallucinations   Other:  All other systems negative   VS: BP 130/76 (BP Location: Right Arm)   Pulse 75   Temp 97.8 F (36.6 C) (Oral)   Resp 16  Ht 6' 4"  (1.93 m)   Wt 102.5 kg   SpO2 93%   BMI 27.51 kg/m      PHYSICAL EXAM    GENERAL:NAD, no fevers, chills, no weakness no fatigue HEAD: Normocephalic, atraumatic.  EYES: Pupils equal, round, reactive to light. Extraocular muscles intact. No scleral icterus.  MOUTH: Moist mucosal membrane. Dentition intact. No abscess noted.  EAR, NOSE, THROAT: Clear without exudates. No external lesions.  NECK: Supple. No thyromegaly. No nodules. No JVD.  PULMONARY: decreased breath sounds with mild rhonchi worse at bases bilaterally.  CARDIOVASCULAR: S1 and S2. Regular rate and rhythm. No murmurs, rubs, or gallops. No edema. Pedal pulses 2+ bilaterally.  GASTROINTESTINAL: Soft, nontender, nondistended. No masses. Positive bowel sounds. No hepatosplenomegaly.  MUSCULOSKELETAL: No swelling, clubbing, or edema. Range of motion full in all extremities.  NEUROLOGIC: Cranial nerves II through XII are intact. No gross focal neurological deficits. Sensation intact. Reflexes intact.  SKIN: No ulceration, lesions, rashes, or cyanosis. Skin warm and dry. Turgor intact.  PSYCHIATRIC: Mood, affect within normal limits. The patient is awake, alert and oriented x 3. Insight, judgment intact.       IMAGING     ASSESSMENT/PLAN    Aspiration of Foreign Body  - There is endoluminal debris in the right lower lobe bronchus consistent with aspiration of foreign body. -During my evaluation patient does have mildly rhonchorous breath sounds over is nonlabored and has been weaned off of supplemental oxygen currently on room air. -We discussed and reviewed CT chest together with findings mostly of bronchitic changes bilaterally worse after right upper lobe and consolidation of the right lower lobe these findings are consistent with aspiration pneumonia and since patient is currently improved clinically we have agreed on medical management at this time with consideration of bronchoscopy with airway inspection and aspiration of foreign body on outpatient basis. -Additionally will order hypertonic saline and flutter valve with MetaNeb therapy for recruitment to help expectorate endobronchial debris.     Aspiration pneumonitis -CRP/ESR -Antimicrobials, currently on Unasyn -starting solumedrol 40 iv and upon dc will initiate prednisone 50 mg once daily with tapering by 5 mg/day.   Multiple comorbid conditions    - Per [facility] hospitalist service     Thank you for allowing me to participate in the care of this patient.   Patient/Family are satisfied with care plan and all questions have been answered.    Provider disclosure: Patient with at least one acute or chronic illness or injury that poses a threat to life or bodily function and is being managed actively during this encounter.  All of the below services have been performed independently by signing provider:  review of prior documentation from internal and or external health records.  Review of previous and current lab results.  Interview and comprehensive assessment during patient visit today. Review of current and previous chest radiographs/CT scans. Discussion of management and test interpretation with health care team and patient/family.   This document was prepared  using Dragon voice recognition software and may include unintentional dictation errors.     Ottie Glazier, M.D.  Division of Pulmonary & Critical Care Medicine

## 2022-03-02 DIAGNOSIS — J9601 Acute respiratory failure with hypoxia: Secondary | ICD-10-CM | POA: Diagnosis not present

## 2022-03-02 LAB — BASIC METABOLIC PANEL
Anion gap: 9 (ref 5–15)
BUN: 16 mg/dL (ref 8–23)
CO2: 32 mmol/L (ref 22–32)
Calcium: 8.6 mg/dL — ABNORMAL LOW (ref 8.9–10.3)
Chloride: 103 mmol/L (ref 98–111)
Creatinine, Ser: <0.3 mg/dL — ABNORMAL LOW (ref 0.61–1.24)
Glucose, Bld: 141 mg/dL — ABNORMAL HIGH (ref 70–99)
Potassium: 4.3 mmol/L (ref 3.5–5.1)
Sodium: 144 mmol/L (ref 135–145)

## 2022-03-02 LAB — CULTURE, BLOOD (ROUTINE X 2): Culture: NO GROWTH

## 2022-03-02 LAB — GLUCOSE, CAPILLARY
Glucose-Capillary: 131 mg/dL — ABNORMAL HIGH (ref 70–99)
Glucose-Capillary: 147 mg/dL — ABNORMAL HIGH (ref 70–99)
Glucose-Capillary: 159 mg/dL — ABNORMAL HIGH (ref 70–99)
Glucose-Capillary: 178 mg/dL — ABNORMAL HIGH (ref 70–99)
Glucose-Capillary: 93 mg/dL (ref 70–99)
Glucose-Capillary: 98 mg/dL (ref 70–99)

## 2022-03-02 LAB — CBC
HCT: 36 % — ABNORMAL LOW (ref 39.0–52.0)
Hemoglobin: 11.7 g/dL — ABNORMAL LOW (ref 13.0–17.0)
MCH: 27.7 pg (ref 26.0–34.0)
MCHC: 32.5 g/dL (ref 30.0–36.0)
MCV: 85.3 fL (ref 80.0–100.0)
Platelets: 263 10*3/uL (ref 150–400)
RBC: 4.22 MIL/uL (ref 4.22–5.81)
RDW: 16.9 % — ABNORMAL HIGH (ref 11.5–15.5)
WBC: 6.9 10*3/uL (ref 4.0–10.5)
nRBC: 0 % (ref 0.0–0.2)

## 2022-03-02 LAB — MAGNESIUM: Magnesium: 2 mg/dL (ref 1.7–2.4)

## 2022-03-02 LAB — PHOSPHORUS: Phosphorus: 3 mg/dL (ref 2.5–4.6)

## 2022-03-02 MED ORDER — TIZANIDINE HCL 4 MG PO TABS: 6.0000 mg | ORAL_TABLET | Freq: Three times a day (TID) | ORAL | 2 refills | Status: AC

## 2022-03-02 MED ORDER — PREDNISOLONE 15 MG/5ML PO SOLN: ORAL | 0 refills | Status: DC

## 2022-03-02 MED ORDER — VITAMIN B-12 1000 MCG PO TABS: 1000.0000 ug | ORAL_TABLET | Freq: Every day | ORAL | Status: DC

## 2022-03-02 MED ORDER — DICLOFENAC SODIUM 1 % EX GEL: 2.0000 g | Freq: Four times a day (QID) | CUTANEOUS | Status: AC

## 2022-03-02 MED ORDER — FREE WATER: 210.0000 mL | Freq: Four times a day (QID) | Status: AC

## 2022-03-02 MED ORDER — AMOXICILLIN-POT CLAVULANATE 875-125 MG PO TABS: 1.0000 | ORAL_TABLET | Freq: Two times a day (BID) | ORAL | Status: DC

## 2022-03-02 MED ORDER — TIZANIDINE HCL 6 MG PO CAPS: 6.0000 mg | ORAL_CAPSULE | Freq: Three times a day (TID) | ORAL | 2 refills | Status: DC

## 2022-03-02 MED ORDER — POLYETHYLENE GLYCOL 3350 17 GM/SCOOP PO POWD: 17.0000 g | ORAL | Status: AC

## 2022-03-02 MED ORDER — DULOXETINE HCL 60 MG PO CPEP: 60.0000 mg | ORAL_CAPSULE | Freq: Every day | ORAL | 1 refills | Status: AC

## 2022-03-02 MED ORDER — METOPROLOL TARTRATE 50 MG PO TABS: 50.0000 mg | ORAL_TABLET | Freq: Two times a day (BID) | ORAL | Status: AC

## 2022-03-02 MED ORDER — BIOTENE DRY MOUTH MT LIQD
15.0000 mL | OROMUCOSAL | 0 refills | Status: AC | PRN
Start: 2022-03-02 — End: ?

## 2022-03-02 MED ORDER — PREDNISONE 5 MG/5ML PO SOLN
50.0000 mg | Freq: Once | ORAL | Status: AC
  Administered 2022-03-02: 50 mg
  Filled 2022-03-02: qty 50

## 2022-03-02 MED ORDER — KATE FARMS STANDARD 1.4 PO LIQD: 406.0000 mL | Freq: Four times a day (QID) | ORAL | Status: AC

## 2022-03-02 MED ORDER — AMOXICILLIN-POT CLAVULANATE 875-125 MG PO TABS: 1.0000 | ORAL_TABLET | Freq: Two times a day (BID) | ORAL | 0 refills | Status: AC

## 2022-03-02 MED ORDER — DANTROLENE SODIUM 100 MG PO CAPS: 100.0000 mg | ORAL_CAPSULE | Freq: Two times a day (BID) | ORAL | 2 refills | Status: AC

## 2022-03-02 MED ORDER — CRANBERRY 250 MG PO CAPS: 250.0000 mg | ORAL_CAPSULE | Freq: Three times a day (TID) | ORAL | Status: AC

## 2022-03-02 MED ORDER — TIZANIDINE HCL 6 MG PO CAPS
6.0000 mg | ORAL_CAPSULE | Freq: Three times a day (TID) | ORAL | 11 refills | Status: DC
Start: 2022-03-02 — End: 2022-03-02

## 2022-03-02 MED ORDER — DANTROLENE SODIUM 100 MG PO CAPS: 100.0000 mg | ORAL_CAPSULE | Freq: Two times a day (BID) | ORAL | Status: DC

## 2022-03-02 MED ORDER — PROSOURCE TF PO LIQD: 45.0000 mL | Freq: Two times a day (BID) | ORAL | Status: AC

## 2022-03-02 MED ORDER — LOSARTAN POTASSIUM 100 MG PO TABS: ORAL_TABLET | ORAL | 1 refills | Status: DC

## 2022-03-02 MED ORDER — ACETAMINOPHEN 325 MG PO TABS: 650.0000 mg | ORAL_TABLET | Freq: Four times a day (QID) | ORAL | 0 refills | Status: AC | PRN

## 2022-03-02 MED ORDER — AMOXICILLIN-POT CLAVULANATE 400-57 MG/5ML PO SUSR
875.0000 mg | Freq: Two times a day (BID) | ORAL | Status: DC
Start: 2022-03-02 — End: 2022-03-02
  Administered 2022-03-02: 875 mg
  Filled 2022-03-02: qty 10.94

## 2022-03-02 MED ORDER — METOPROLOL TARTRATE 50 MG PO TABS: 50.0000 mg | ORAL_TABLET | Freq: Two times a day (BID) | ORAL | 1 refills | Status: DC

## 2022-03-02 MED ORDER — DAILY VITES PO TABS: 1.0000 | ORAL_TABLET | Freq: Every day | ORAL | Status: AC

## 2022-03-02 MED ORDER — PANTOPRAZOLE SODIUM 40 MG PO PACK: 20.0000 mg | PACK | Freq: Two times a day (BID) | ORAL | 2 refills | Status: DC

## 2022-03-02 NOTE — TOC Progression Note (Addendum)
Transition of Care [facility]) - Progression Note    Patient Details  Name: Austin Conner MRN: [id number] Date of Birth: January 08, 1956  Transition of Care [facility]) CM/SW [place], [place] Phone Number: 03/02/2022, 10:32 AM  Clinical Narrative:     Per Thedore Mins with [employer] patient's home feeds will total $84 a month that's with pro source and [employer] farms. [employer] is $56, pro source is $6.80, and then the supplies are $21.48  Thedore Mins reports [employer] is reaching out to patient/family today to discuss costs and ensure no barriers with them affording this prior to discharge today.   Merleen Nicely with [facility] informed of patient's dc today. HH orders are in.  Expected Discharge Plan: [place] Barriers to Discharge: Continued Medical Work up  Expected Discharge Plan and Services Expected Discharge Plan: [place] arrangements for the past 2 months: Single Family Home                 DME Arranged: Tube feeding, [place] DME Agency: [employer]       HH Arranged: PT, OT, Speech Therapy, Nurse's Aide HH Agency: [facility] Date [place] Agency Contacted: 02/27/22 Time [place]: [phone] Representative spoke with at [place]: [place] Determinants of Health ([place]) Interventions    Readmission Risk Interventions     No data to display

## 2022-03-02 NOTE — Discharge Summary (Incomplete)
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 Anemia work up No results for input(s): "VITAMINB12", "FOLATE", "FERRITIN", "TIBC", "IRON", "RETICCTPCT" in the last 72 hours. Urinalysis    Component Value Date/Time   COLORURINE YELLOW (A) 02/25/2022 0540   APPEARANCEUR CLEAR (A) 02/25/2022 0540   LABSPEC 1.008 02/25/2022 0540   PHURINE 7.0 02/25/2022 0540   GLUCOSEU NEGATIVE 02/25/2022 0540   HGBUR NEGATIVE 02/25/2022 0540   BILIRUBINUR NEGATIVE 02/25/2022 0540   BILIRUBINUR negative 11/21/2021 1317   KETONESUR 20 (A) 02/25/2022 0540   PROTEINUR NEGATIVE 02/25/2022 0540   UROBILINOGEN 0.2 11/21/2021 1317   NITRITE NEGATIVE 02/25/2022 0540   LEUKOCYTESUR NEGATIVE 02/25/2022 0540   Sepsis Labs Recent Labs  Lab 02/27/22 0410 02/28/22 0525 03/01/22 0518 03/02/22 0342  WBC 9.5 7.0 5.0 6.9   Microbiology Recent Results (from the past 240 hour(s))  Blood Culture (routine x 2)     Status: None   Collection Time: 02/25/22  5:40 AM   Specimen:  BLOOD  Result Value Ref Range Status   Specimen Description BLOOD RIGHT HAND  Final   Special Requests  Final    BOTTLES DRAWN AEROBIC AND ANAEROBIC Blood Culture results may not be optimal due to an excessive volume of blood received in culture bottles   Culture   Final    NO GROWTH 5 DAYS Performed at [facility], [address] [place], [place] [address]    Report Status 03/02/2022 FINAL  Final  Urine Culture     Status: Abnormal   Collection Time: 02/25/22  5:40 AM   Specimen: In/Out Cath Urine  Result Value Ref Range Status   Specimen Description   Final    IN/OUT CATH URINE Performed at [facility], [address], [place], [place] [address]    Special Requests   Final    NONE Performed at [facility], [address] [place], [place] [address]    Culture MULTIPLE SPECIES PRESENT, SUGGEST RECOLLECTION (A)  Final   Report Status 02/26/2022 FINAL  Final  Culture, blood (Routine X 2) w Reflex to ID Panel     Status: Abnormal   Collection Time: 02/25/22  4:31 PM   Specimen: BLOOD  Result Value Ref Range Status   Specimen Description   Final    BLOOD BLOOD LEFT HAND Performed at [facility], [address], [place], [place] [address]    Special Requests   Final    BOTTLES DRAWN AEROBIC ONLY Blood Culture results may not be optimal due to an inadequate volume of blood received in culture bottles Performed at [facility], [address], [place], [place] [address]    Culture  Setup Time   Final    GRAM POSITIVE COCCI AEROBIC BOTTLE ONLY CRITICAL RESULT CALLED TO, READ BACK BY AND VERIFIED WITH: [facility] MITCHELL TFTDDU 2025 02/26/22 HNM    Culture (A)  Final    STAPHYLOCOCCUS CAPITIS STAPHYLOCOCCUS CAPRAE THE SIGNIFICANCE OF ISOLATING THIS ORGANISM FROM A SINGLE SET OF BLOOD CULTURES WHEN MULTIPLE SETS ARE DRAWN IS UNCERTAIN. PLEASE NOTIFY THE MICROBIOLOGY DEPARTMENT WITHIN ONE WEEK IF SPECIATION AND SENSITIVITIES ARE  REQUIRED. Performed at [facility], [place] [address] [place], [place] [address]    Report Status 02/28/2022 FINAL  Final  Blood Culture ID Panel (Reflexed)     Status: Abnormal   Collection Time: 02/25/22  4:31 PM  Result Value Ref Range Status   Enterococcus faecalis NOT DETECTED NOT DETECTED Final   Enterococcus Faecium NOT DETECTED NOT DETECTED Final   Listeria monocytogenes NOT DETECTED NOT DETECTED Final   Staphylococcus species DETECTED (A) NOT DETECTED Final    Comment: CRITICAL RESULT CALLED TO, READ BACK BY AND VERIFIED WITH: DEVAN MITCHELL [id number] [id number] 02/26/22 HNM    Staphylococcus aureus (BCID) NOT DETECTED NOT DETECTED Final   Staphylococcus epidermidis NOT DETECTED NOT DETECTED Final   Staphylococcus lugdunensis NOT DETECTED NOT DETECTED Final   Streptococcus species NOT DETECTED NOT DETECTED Final   Streptococcus agalactiae NOT DETECTED NOT DETECTED Final   Streptococcus pneumoniae NOT DETECTED NOT DETECTED Final   Streptococcus pyogenes NOT DETECTED NOT DETECTED Final   A.calcoaceticus-baumannii NOT DETECTED NOT DETECTED Final   Bacteroides fragilis NOT DETECTED NOT DETECTED Final   Enterobacterales NOT DETECTED NOT DETECTED Final   Enterobacter cloacae complex NOT DETECTED NOT DETECTED Final   Escherichia coli NOT DETECTED NOT DETECTED Final   Klebsiella aerogenes NOT DETECTED NOT DETECTED Final   Klebsiella oxytoca NOT DETECTED NOT DETECTED Final   Klebsiella pneumoniae NOT DETECTED NOT DETECTED Final   Proteus species NOT DETECTED NOT DETECTED Final  Salmonella species NOT DETECTED NOT DETECTED Final   Serratia marcescens NOT DETECTED NOT DETECTED Final   Haemophilus influenzae NOT DETECTED NOT DETECTED Final   Neisseria meningitidis NOT DETECTED NOT DETECTED Final   Pseudomonas aeruginosa NOT DETECTED NOT DETECTED Final   Stenotrophomonas maltophilia NOT DETECTED NOT DETECTED Final   Candida albicans NOT DETECTED NOT DETECTED Final   Candida auris NOT  DETECTED NOT DETECTED Final   Candida glabrata NOT DETECTED NOT DETECTED Final   Candida krusei NOT DETECTED NOT DETECTED Final   Candida parapsilosis NOT DETECTED NOT DETECTED Final   Candida tropicalis NOT DETECTED NOT DETECTED Final   Cryptococcus neoformans/gattii NOT DETECTED NOT DETECTED Final    Comment: Performed at [facility], [address] [place], [place] [address]     Total time spend on discharging this patient, including the last patient exam, discussing the hospital stay, instructions for ongoing care as it relates to all pertinent caregivers, as well as preparing the medical discharge records, prescriptions, and/or referrals as applicable, is 60 minutes.    Enzo Bi, MD  [facility] 03/02/2022, 5:46 PM
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
## 2022-03-02 NOTE — Care Management Important Message (Signed)
Important Message  Patient Details  Name: Austin Conner MRN: [id number] Date of Birth: 1955/12/31   Medicare Important Message Given:  Yes     Dannette Barbara 03/02/2022, 12:19 PM

## 2022-03-02 NOTE — Progress Notes (Signed)
Brief Nutrition Follow-Up Note  Case discussed with TOC and Dr. Billie Ruddy. Pt sister concerned about cost of TF and supplies and would like discuss other options for TF.   Spoke with pt sister at bedside. Reviewed formulary options with her. Discussed that Dillard [place] is a specialty formula and is typically more expensive than standard formulas. RD reviewed other standard formulas, including Osmolite 1.5, which RD recommended if choosing not to use [employer]. Reviewed both formulas with her and gave her time to decide; RD returned one hour later for decision. Pt sister would like to continue with [employer].   Per TOC, [employer] is requesting 3 day supply (15 cartons) of formula. Hospital currently only has 7 containers of TF available. RD reached out to [employer] Rep [facility]) to see if samples could be provided to pt to help meet need. Rep agreeable to send a case of samples. RD informed pt sister, MD, TOC, and RN of plan. Plan to discharge home today.   TF regimen for home as below:  406 ml (1.25 cartons) [name] Standard 1.4 4 times daily via g-tube    45 ml Prosource TF BID.   105 ml free water flush before and after each feeding administration     Tube feeding regimen provides 2315 kcal (100% of needs), 122 grams of protein, and 1170 ml of H2O. Total free water: 2010 ml daily  Loistine Chance, RD, LDN, [place] Registered Dietitian II Certified Diabetes Care and Education Specialist Please refer to [facility] for RD and/or RD on-call/weekend/after hours pager

## 2022-03-03 ENCOUNTER — Encounter: Payer: Self-pay | Admitting: Nurse Practitioner

## 2022-03-03 ENCOUNTER — Telehealth: Payer: Self-pay

## 2022-03-03 DIAGNOSIS — R051 Acute cough: Secondary | ICD-10-CM

## 2022-03-03 MED ORDER — GUAIFENESIN 200 MG/5ML PO LIQD: 600.0000 mg | Freq: Two times a day (BID) | ORAL | 0 refills | Status: DC

## 2022-03-03 NOTE — Telephone Encounter (Signed)
Contacted [employer] and spoke with Tawana who reports there is a note that the pt has been d/c and they have the machine and are awaiting a rep to contact to set up with pt and sister. Number is [phone].  Contacted [name] and advised of suction machine. She reported pt has been taking mucinex but it has been in a pill form. She would like to use liquid due to pt's feeding tube but is unsure which type to buy. She reports he does not do very well with mucinex DM, so she has been giving him regular mucinex but having to crush the pills. She is requesting PCP suggestion on which type of liquid mucinex she should buy. She said a msg could be sent via mychart with the suggestion.

## 2022-03-03 NOTE — Telephone Encounter (Signed)
Conversation concerning pt status was with pt's sister, [name]  Transition Care Management Follow-up Telephone Call Date of discharge and from where: 8/7, [facility] How have you been since you were released from the hospital? Doing better since able to cough up phlegm which is currently yellow and thick but doing better overall Any questions or concerns? No  Items Reviewed: Did the pt receive and understand the discharge instructions provided? Yes  Medications obtained and verified? Yes  Other? No  Any new allergies since your discharge? No  Dietary orders reviewed? Yes Do you have support at home? Yes   Home Care and Equipment/Supplies: Were home health services ordered? yes If so, what is the name of the agency? [facility]  Has the agency set up a time to come to the patient's home? yes Were any new equipment or medical supplies ordered?  Yes: Suction Machine What is the name of the medical supply agency? [employer] Were you able to get the supplies/equipment? No, requested this nurse contact [employer] for information Do you have any questions related to the use of the equipment or supplies? No  Functional Questionnaire: (I = Independent and D = Dependent) ADLs: D  Bathing/Dressing- D  Meal Prep- D  Eating- D  Maintaining continence- D  Transferring/Ambulation- D  Managing Meds- D  Follow up appointments reviewed:  PCP Hospital f/u appt confirmed? Yes  Scheduled to see Romilda Garret, NP on 03/09/22 @ 2:40. [place] [facility] f/u appt confirmed? No  Sister will contact pulmonologist and make apt within 2 weeks for f/u Are transportation arrangements needed? No  If their condition worsens, is the pt aware to call PCP or go to the Emergency Dept.? Yes Was the patient provided with contact information for the PCP's office or ED? Yes Was to pt encouraged to call back with questions or concerns? Yes

## 2022-03-03 NOTE — Telephone Encounter (Signed)
TCM Done

## 2022-03-04 NOTE — Telephone Encounter (Signed)
I called pt's insurance to check on the process, I talked to rep [place], she stated a PA is not required for [place], [id number], [id number], [id number], [id number], [phone], [phone]. Reference # for today's call is [id number].

## 2022-03-04 NOTE — Telephone Encounter (Signed)
I called pt's sister to get pt scheduled for Xeomin Injection scheduled.

## 2022-03-05 DIAGNOSIS — M6281 Muscle weakness (generalized): Secondary | ICD-10-CM | POA: Diagnosis not present

## 2022-03-05 DIAGNOSIS — R1311 Dysphagia, oral phase: Secondary | ICD-10-CM | POA: Diagnosis not present

## 2022-03-05 DIAGNOSIS — S062X9D Diffuse traumatic brain injury with loss of consciousness of unspecified duration, subsequent encounter: Secondary | ICD-10-CM | POA: Diagnosis not present

## 2022-03-05 DIAGNOSIS — M24542 Contracture, left hand: Secondary | ICD-10-CM | POA: Diagnosis not present

## 2022-03-06 ENCOUNTER — Telehealth: Payer: Self-pay | Admitting: Nurse Practitioner

## 2022-03-06 NOTE — Telephone Encounter (Signed)
This is fine but may need MD to start the home health

## 2022-03-06 NOTE — Telephone Encounter (Signed)
Home Health verbal orders Caller Name: Stephanie Agency Name: [facility]  Callback number: [phone]  Requesting OT  Frequency: 1 time a week for 6 weeks  Please forward to [facility] pool or providers CMA  

## 2022-03-06 NOTE — Telephone Encounter (Signed)
Stephanie advised.

## 2022-03-06 NOTE — Telephone Encounter (Signed)
Please ok those verbal orders  

## 2022-03-08 NOTE — Telephone Encounter (Signed)
Can you guys please do his PA? It looks like one that can be done online like Jinny Blossom showed [place] in the meeting. I am sure the note about it pending approval is not correct. Thanks!

## 2022-03-09 ENCOUNTER — Telehealth: Payer: Self-pay

## 2022-03-09 ENCOUNTER — Inpatient Hospital Stay: Payer: Medicare Other | Admitting: Nurse Practitioner

## 2022-03-09 NOTE — Telephone Encounter (Signed)
Received approval through cover my meds for this med   (Key: [id number])  This request has been approved. [employer] [place] will send a letter to the member and you regarding this decision. Please see additional information at the bottom of this request.  I called pt's insurance and spoke with [facility] in medical claims. Provided J code for Xeomin (B8466) and was advised PA was not needed pt is covered through B/B/  Ref # for this call [id number]  Pt ok to be scheduled under Dr. Krista Blue only.

## 2022-03-09 NOTE — Telephone Encounter (Signed)
Called and spoke with pt's sister, Xeomin scheduled for 04/01/22 at 3:30 pm with Dr. Krista Blue.
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
## 2022-03-10 ENCOUNTER — Other Ambulatory Visit: Payer: Self-pay

## 2022-03-10 ENCOUNTER — Emergency Department: Payer: Medicare Other

## 2022-03-10 ENCOUNTER — Inpatient Hospital Stay
Admission: EM | Admit: 2022-03-10 | Discharge: 2022-03-13 | DRG: 177 | Disposition: A | Discharge status: Home Health | Payer: Medicare Other | Attending: Internal Medicine | Admitting: Internal Medicine

## 2022-03-10 ENCOUNTER — Encounter: Payer: Self-pay | Admitting: Emergency Medicine

## 2022-03-10 DIAGNOSIS — Z6830 Body mass index (BMI) 30.0-30.9, adult: Secondary | ICD-10-CM | POA: Diagnosis not present

## 2022-03-10 DIAGNOSIS — N400 Enlarged prostate without lower urinary tract symptoms: Secondary | ICD-10-CM | POA: Diagnosis not present

## 2022-03-10 DIAGNOSIS — R531 Weakness: Secondary | ICD-10-CM

## 2022-03-10 DIAGNOSIS — R051 Acute cough: Secondary | ICD-10-CM

## 2022-03-10 DIAGNOSIS — Z20822 Contact with and (suspected) exposure to covid-19: Secondary | ICD-10-CM | POA: Diagnosis not present

## 2022-03-10 DIAGNOSIS — Z881 Allergy status to other antibiotic agents status: Secondary | ICD-10-CM | POA: Diagnosis not present

## 2022-03-10 DIAGNOSIS — M62838 Other muscle spasm: Secondary | ICD-10-CM | POA: Diagnosis present

## 2022-03-10 DIAGNOSIS — I10 Essential (primary) hypertension: Secondary | ICD-10-CM | POA: Diagnosis present

## 2022-03-10 DIAGNOSIS — Z8249 Family history of ischemic heart disease and other diseases of the circulatory system: Secondary | ICD-10-CM | POA: Diagnosis not present

## 2022-03-10 DIAGNOSIS — G8114 Spastic hemiplegia affecting left nondominant side: Secondary | ICD-10-CM | POA: Diagnosis present

## 2022-03-10 DIAGNOSIS — J181 Lobar pneumonia, unspecified organism: Secondary | ICD-10-CM | POA: Diagnosis not present

## 2022-03-10 DIAGNOSIS — J690 Pneumonitis due to inhalation of food and vomit: Principal | ICD-10-CM | POA: Diagnosis present

## 2022-03-10 DIAGNOSIS — E669 Obesity, unspecified: Secondary | ICD-10-CM | POA: Diagnosis not present

## 2022-03-10 DIAGNOSIS — Z7401 Bed confinement status: Secondary | ICD-10-CM

## 2022-03-10 DIAGNOSIS — Z931 Gastrostomy status: Secondary | ICD-10-CM | POA: Diagnosis not present

## 2022-03-10 DIAGNOSIS — R0602 Shortness of breath: Secondary | ICD-10-CM | POA: Diagnosis not present

## 2022-03-10 DIAGNOSIS — Z87898 Personal history of other specified conditions: Secondary | ICD-10-CM

## 2022-03-10 DIAGNOSIS — Z79899 Other long term (current) drug therapy: Secondary | ICD-10-CM

## 2022-03-10 DIAGNOSIS — J189 Pneumonia, unspecified organism: Secondary | ICD-10-CM | POA: Diagnosis not present

## 2022-03-10 DIAGNOSIS — G8250 Quadriplegia, unspecified: Secondary | ICD-10-CM | POA: Diagnosis not present

## 2022-03-10 DIAGNOSIS — G2581 Restless legs syndrome: Secondary | ICD-10-CM | POA: Diagnosis not present

## 2022-03-10 DIAGNOSIS — E041 Nontoxic single thyroid nodule: Secondary | ICD-10-CM | POA: Diagnosis not present

## 2022-03-10 DIAGNOSIS — R0689 Other abnormalities of breathing: Secondary | ICD-10-CM | POA: Diagnosis not present

## 2022-03-10 DIAGNOSIS — Z820 Family history of epilepsy and other diseases of the nervous system: Secondary | ICD-10-CM

## 2022-03-10 DIAGNOSIS — J9601 Acute respiratory failure with hypoxia: Secondary | ICD-10-CM | POA: Diagnosis not present

## 2022-03-10 DIAGNOSIS — K219 Gastro-esophageal reflux disease without esophagitis: Secondary | ICD-10-CM | POA: Diagnosis not present

## 2022-03-10 DIAGNOSIS — T17908A Unspecified foreign body in respiratory tract, part unspecified causing other injury, initial encounter: Secondary | ICD-10-CM | POA: Diagnosis present

## 2022-03-10 DIAGNOSIS — Z87820 Personal history of traumatic brain injury: Secondary | ICD-10-CM | POA: Diagnosis not present

## 2022-03-10 LAB — BASIC METABOLIC PANEL
Anion gap: 8 (ref 5–15)
BUN: 22 mg/dL (ref 8–23)
CO2: 29 mmol/L (ref 22–32)
Calcium: 8.9 mg/dL (ref 8.9–10.3)
Chloride: 103 mmol/L (ref 98–111)
Creatinine, Ser: 0.31 mg/dL — ABNORMAL LOW (ref 0.61–1.24)
GFR, Estimated: >60 mL/min (ref >60)
Glucose, Bld: 173 mg/dL — ABNORMAL HIGH (ref 70–99)
Potassium: 4.4 mmol/L (ref 3.5–5.1)
Sodium: 140 mmol/L (ref 135–145)

## 2022-03-10 LAB — CBC WITH DIFFERENTIAL/PLATELET
Abs Immature Granulocytes: 0.1 10*3/uL — ABNORMAL HIGH (ref 0.00–0.07)
Basophils Absolute: 0 10*3/uL (ref 0.0–0.1)
Basophils Relative: 0 %
Eosinophils Absolute: 0 10*3/uL (ref 0.0–0.5)
Eosinophils Relative: 0 %
HCT: 40 % (ref 39.0–52.0)
Hemoglobin: 13 g/dL (ref 13.0–17.0)
Immature Granulocytes: 1 %
Lymphocytes Relative: 7 %
Lymphs Abs: 0.6 10*3/uL — ABNORMAL LOW (ref 0.7–4.0)
MCH: 28.4 pg (ref 26.0–34.0)
MCHC: 32.5 g/dL (ref 30.0–36.0)
MCV: 87.3 fL (ref 80.0–100.0)
Monocytes Absolute: 0.1 10*3/uL (ref 0.1–1.0)
Monocytes Relative: 2 %
Neutro Abs: 8.4 10*3/uL — ABNORMAL HIGH (ref 1.7–7.7)
Neutrophils Relative %: 90 %
Platelets: 364 10*3/uL (ref 150–400)
RBC: 4.58 MIL/uL (ref 4.22–5.81)
RDW: 18.3 % — ABNORMAL HIGH (ref 11.5–15.5)
WBC: 9.3 10*3/uL (ref 4.0–10.5)
nRBC: 0 % (ref 0.0–0.2)

## 2022-03-10 LAB — TROPONIN I (HIGH SENSITIVITY)
Troponin I (High Sensitivity): 7 ng/L (ref <18)
Troponin I (High Sensitivity): 9 ng/L (ref <18)

## 2022-03-10 LAB — SARS CORONAVIRUS 2 BY RT PCR: SARS Coronavirus 2 by RT PCR: NEGATIVE

## 2022-03-10 LAB — LACTIC ACID, PLASMA: Lactic Acid, Venous: 0.9 mmol/L (ref 0.5–1.9)

## 2022-03-10 LAB — BRAIN NATRIURETIC PEPTIDE: B Natriuretic Peptide: 34 pg/mL (ref 0.0–100.0)

## 2022-03-10 MED ORDER — ADULT MULTIVITAMIN W/MINERALS CH
1.0000 | ORAL_TABLET | Freq: Every day | ORAL | Status: DC
Start: 2022-03-11 — End: 2022-03-13
  Administered 2022-03-11 – 2022-03-12 (×2): 1
  Filled 2022-03-10 (×2): qty 1

## 2022-03-10 MED ORDER — POLYETHYLENE GLYCOL 3350 17 GM/SCOOP PO POWD
17.0000 g | ORAL | Status: DC
Start: 2022-03-10 — End: 2022-03-10
  Filled 2022-03-10: qty 255

## 2022-03-10 MED ORDER — SODIUM CHLORIDE 0.9 % IV SOLN
2.0000 g | INTRAVENOUS | Status: DC
  Administered 2022-03-11 – 2022-03-12 (×2): 2 g via INTRAVENOUS
  Filled 2022-03-10 (×3): qty 20

## 2022-03-10 MED ORDER — ACETAMINOPHEN 325 MG PO TABS
650.0000 mg | ORAL_TABLET | Freq: Four times a day (QID) | ORAL | Status: DC | PRN
Start: 2022-03-10 — End: 2022-03-13

## 2022-03-10 MED ORDER — LOSARTAN POTASSIUM 50 MG PO TABS
100.0000 mg | ORAL_TABLET | Freq: Every day | ORAL | Status: DC
  Administered 2022-03-11 – 2022-03-12 (×2): 100 mg
  Filled 2022-03-10 (×2): qty 2

## 2022-03-10 MED ORDER — ENOXAPARIN SODIUM 40 MG/0.4ML IJ SOSY
40.0000 mg | PREFILLED_SYRINGE | Freq: Every day | INTRAMUSCULAR | Status: DC
  Administered 2022-03-10 – 2022-03-12 (×3): 40 mg via SUBCUTANEOUS
  Filled 2022-03-10 (×2): qty 0.4

## 2022-03-10 MED ORDER — METOPROLOL TARTRATE 50 MG PO TABS
50.0000 mg | ORAL_TABLET | Freq: Two times a day (BID) | ORAL | Status: DC
Start: 2022-03-10 — End: 2022-03-13
  Administered 2022-03-11 – 2022-03-12 (×4): 50 mg
  Filled 2022-03-10 (×4): qty 1

## 2022-03-10 MED ORDER — VANCOMYCIN HCL 2000 MG/400ML IV SOLN: 2000.0000 mg | Freq: Once | INTRAVENOUS | Status: DC

## 2022-03-10 MED ORDER — ACETAMINOPHEN 325 MG PO TABS
650.0000 mg | ORAL_TABLET | Freq: Four times a day (QID) | ORAL | Status: DC | PRN
Start: 2022-03-10 — End: 2022-03-10

## 2022-03-10 MED ORDER — KATE FARMS STANDARD 1.4 PO LIQD
406.0000 mL | Freq: Four times a day (QID) | ORAL | Status: DC
  Administered 2022-03-11 – 2022-03-12 (×4): 406 mL
  Filled 2022-03-10: qty 650

## 2022-03-10 MED ORDER — HYDRALAZINE HCL 20 MG/ML IJ SOLN: 5.0000 mg | Freq: Three times a day (TID) | INTRAMUSCULAR | Status: DC | PRN

## 2022-03-10 MED ORDER — ONDANSETRON HCL 4 MG PO TABS: 4.0000 mg | ORAL_TABLET | Freq: Four times a day (QID) | ORAL | Status: DC | PRN

## 2022-03-10 MED ORDER — SENNOSIDES-DOCUSATE SODIUM 8.6-50 MG PO TABS: 1.0000 | ORAL_TABLET | Freq: Every evening | ORAL | Status: DC | PRN

## 2022-03-10 MED ORDER — ALBUTEROL SULFATE (2.5 MG/3ML) 0.083% IN NEBU: 2.5000 mg | INHALATION_SOLUTION | Freq: Three times a day (TID) | RESPIRATORY_TRACT | Status: DC | PRN

## 2022-03-10 MED ORDER — GUAIFENESIN 100 MG/5ML PO LIQD
200.0000 mg | Freq: Four times a day (QID) | ORAL | Status: DC
  Administered 2022-03-11 – 2022-03-12 (×5): 200 mg
  Filled 2022-03-10 (×3): qty 10
  Filled 2022-03-10: qty 20
  Filled 2022-03-10 (×2): qty 10

## 2022-03-10 MED ORDER — VITAMIN B-12 1000 MCG PO TABS
1000.0000 ug | ORAL_TABLET | Freq: Every day | ORAL | Status: DC
Start: 2022-03-11 — End: 2022-03-13
  Administered 2022-03-11 – 2022-03-12 (×2): 1000 ug
  Filled 2022-03-10 (×2): qty 1

## 2022-03-10 MED ORDER — DANTROLENE SODIUM 100 MG PO CAPS
100.0000 mg | ORAL_CAPSULE | Freq: Two times a day (BID) | ORAL | Status: DC
  Administered 2022-03-11 – 2022-03-12 (×4): 100 mg
  Filled 2022-03-10 (×4): qty 1

## 2022-03-10 MED ORDER — TIZANIDINE HCL 4 MG PO TABS
6.0000 mg | ORAL_TABLET | Freq: Three times a day (TID) | ORAL | Status: DC
  Administered 2022-03-11 – 2022-03-12 (×6): 6 mg
  Filled 2022-03-10 (×2): qty 3
  Filled 2022-03-10 (×5): qty 1

## 2022-03-10 MED ORDER — PIPERACILLIN-TAZOBACTAM 3.375 G IVPB 30 MIN
3.3750 g | Freq: Once | INTRAVENOUS | Status: AC
  Administered 2022-03-10: 3.375 g via INTRAVENOUS
  Filled 2022-03-10: qty 50

## 2022-03-10 MED ORDER — LORAZEPAM 2 MG/ML IJ SOLN: 2.0000 mg | INTRAMUSCULAR | Status: DC | PRN

## 2022-03-10 MED ORDER — VANCOMYCIN HCL IN DEXTROSE 1-5 GM/200ML-% IV SOLN
1000.0000 mg | Freq: Once | INTRAVENOUS | Status: AC
  Administered 2022-03-10: 1000 mg via INTRAVENOUS
  Filled 2022-03-10: qty 200

## 2022-03-10 MED ORDER — POLYETHYLENE GLYCOL 3350 17 G PO PACK
17.0000 g | PACK | ORAL | Status: DC
  Filled 2022-03-10: qty 1

## 2022-03-10 MED ORDER — ACETAMINOPHEN 325 MG RE SUPP: 650.0000 mg | Freq: Four times a day (QID) | RECTAL | Status: DC | PRN

## 2022-03-10 MED ORDER — ONDANSETRON HCL 4 MG/2ML IJ SOLN
4.0000 mg | Freq: Four times a day (QID) | INTRAMUSCULAR | Status: DC | PRN
Start: 2022-03-10 — End: 2022-03-13

## 2022-03-10 MED ORDER — DULOXETINE HCL 30 MG PO CPEP
60.0000 mg | ORAL_CAPSULE | Freq: Every day | ORAL | Status: DC
  Administered 2022-03-11: 60 mg via ORAL
  Filled 2022-03-10: qty 2

## 2022-03-10 MED ORDER — METRONIDAZOLE 500 MG/100ML IV SOLN
500.0000 mg | Freq: Two times a day (BID) | INTRAVENOUS | Status: DC
  Administered 2022-03-11: 500 mg via INTRAVENOUS
  Filled 2022-03-10: qty 100

## 2022-03-10 MED ORDER — BIOTENE DRY MOUTH MT LIQD
15.0000 mL | OROMUCOSAL | Status: DC | PRN
Start: 2022-03-10 — End: 2022-03-13

## 2022-03-10 MED ORDER — PANTOPRAZOLE 2 MG/ML SUSPENSION
20.0000 mg | Freq: Two times a day (BID) | ORAL | Status: DC
Start: 2022-03-10 — End: 2022-03-13
  Administered 2022-03-11 – 2022-03-12 (×4): 20 mg
  Filled 2022-03-10 (×6): qty 20

## 2022-03-10 MED ORDER — DICLOFENAC SODIUM 1 % EX GEL
2.0000 g | Freq: Four times a day (QID) | CUTANEOUS | Status: DC
  Administered 2022-03-11 – 2022-03-12 (×4): 2 g via TOPICAL
  Filled 2022-03-10: qty 100

## 2022-03-10 MED ORDER — PREDNISOLONE 15 MG/5ML PO SOLN
5.0000 mg | Freq: Every day | ORAL | Status: DC
  Administered 2022-03-11 – 2022-03-12 (×2): 5.1 mg
  Filled 2022-03-10 (×2): qty 1.7
  Filled 2022-03-10: qty 5

## 2022-03-10 NOTE — Progress Notes (Signed)
Patient NT suctioned for large amount of white thick sputum, collected and sent to lab. Patient oxygen increased during suctioning with returning to 2lpm [place] after suctioning with saturations increasing to 96%, HR 90, RR 22.  Patient states breathing is much better now after suctioning.

## 2022-03-10 NOTE — ED Triage Notes (Signed)
Pt RA O2 90% in triage.

## 2022-03-10 NOTE — Assessment & Plan Note (Addendum)
MRSA PCR negative.  Sputum for Gram stain culture in progress.  Recurrent aspiration pneumonia, and acute episode may have been secondary to mucous plug given normal procalcitonin.  That said, CT noted persistent multilobar pneumonia and sputum culture positive for Citrobacter koseri.  Patient able to be weaned off of oxygen.  Patient lives with sister who cares for him, she is a former Marine scientist.  She received information on nasal suction and received equipment for suction catheters.  Discharged on 3 more days of Vantin to complete 7 days of therapy.

## 2022-03-10 NOTE — Assessment & Plan Note (Addendum)
-   Tizanidine 6 mg per tube 3 times daily, dantrolene 100 mg per tube twice daily resumed

## 2022-03-10 NOTE — Assessment & Plan Note (Addendum)
-   Losartan 100 mg nightly per tube resumed, Toprol tartrate 50 mg per tube, twice daily resumed - Hydralazine 5 mg IV every 8 hours as needed for SBP greater than 180, 4 days ordered
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
## 2022-03-10 NOTE — Assessment & Plan Note (Addendum)
-   Per sister, patient's last seizure was in 2018 with the sudden discontinuation of baclofen.  Patient is currently not on baclofen however has not had any recurrent seizure since. - Per sister patient is currently not taking any antiseizure medications - Ativan 2 mg IV every 4 hours as needed for seizure, 3 doses ordered

## 2022-03-10 NOTE — ED Notes (Signed)
2 L [place] saturation holding 95%

## 2022-03-10 NOTE — ED Notes (Signed)
MD to bedside pt with large amount of sputum in lower airway O2 saturation 89-90% on 5L [place]. RT to bedside for NP suction. Pt tolerated well. Large amount of sputum suctioned. Will monitor.

## 2022-03-10 NOTE — Assessment & Plan Note (Signed)
PPI ?
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
## 2022-03-11 DIAGNOSIS — T17908A Unspecified foreign body in respiratory tract, part unspecified causing other injury, initial encounter: Secondary | ICD-10-CM | POA: Diagnosis present

## 2022-03-11 DIAGNOSIS — Z820 Family history of epilepsy and other diseases of the nervous system: Secondary | ICD-10-CM | POA: Diagnosis not present

## 2022-03-11 DIAGNOSIS — E669 Obesity, unspecified: Secondary | ICD-10-CM

## 2022-03-11 DIAGNOSIS — Z20822 Contact with and (suspected) exposure to covid-19: Secondary | ICD-10-CM | POA: Diagnosis not present

## 2022-03-11 DIAGNOSIS — Z87820 Personal history of traumatic brain injury: Secondary | ICD-10-CM | POA: Diagnosis not present

## 2022-03-11 DIAGNOSIS — Z7401 Bed confinement status: Secondary | ICD-10-CM | POA: Diagnosis not present

## 2022-03-11 DIAGNOSIS — M62838 Other muscle spasm: Secondary | ICD-10-CM | POA: Diagnosis not present

## 2022-03-11 DIAGNOSIS — G8114 Spastic hemiplegia affecting left nondominant side: Secondary | ICD-10-CM

## 2022-03-11 DIAGNOSIS — E041 Nontoxic single thyroid nodule: Secondary | ICD-10-CM | POA: Diagnosis not present

## 2022-03-11 DIAGNOSIS — Z931 Gastrostomy status: Secondary | ICD-10-CM | POA: Diagnosis not present

## 2022-03-11 DIAGNOSIS — G2581 Restless legs syndrome: Secondary | ICD-10-CM | POA: Diagnosis not present

## 2022-03-11 DIAGNOSIS — G8250 Quadriplegia, unspecified: Secondary | ICD-10-CM | POA: Diagnosis not present

## 2022-03-11 DIAGNOSIS — K219 Gastro-esophageal reflux disease without esophagitis: Secondary | ICD-10-CM | POA: Diagnosis not present

## 2022-03-11 DIAGNOSIS — J9601 Acute respiratory failure with hypoxia: Secondary | ICD-10-CM | POA: Diagnosis not present

## 2022-03-11 DIAGNOSIS — J690 Pneumonitis due to inhalation of food and vomit: Secondary | ICD-10-CM | POA: Diagnosis not present

## 2022-03-11 DIAGNOSIS — Z79899 Other long term (current) drug therapy: Secondary | ICD-10-CM | POA: Diagnosis not present

## 2022-03-11 DIAGNOSIS — I10 Essential (primary) hypertension: Secondary | ICD-10-CM | POA: Diagnosis not present

## 2022-03-11 DIAGNOSIS — Z8249 Family history of ischemic heart disease and other diseases of the circulatory system: Secondary | ICD-10-CM | POA: Diagnosis not present

## 2022-03-11 DIAGNOSIS — N400 Enlarged prostate without lower urinary tract symptoms: Secondary | ICD-10-CM | POA: Diagnosis not present

## 2022-03-11 DIAGNOSIS — Z881 Allergy status to other antibiotic agents status: Secondary | ICD-10-CM | POA: Diagnosis not present

## 2022-03-11 DIAGNOSIS — Z6830 Body mass index (BMI) 30.0-30.9, adult: Secondary | ICD-10-CM | POA: Diagnosis not present

## 2022-03-11 LAB — BASIC METABOLIC PANEL
Anion gap: 5 (ref 5–15)
BUN: 16 mg/dL (ref 8–23)
CO2: 32 mmol/L (ref 22–32)
Calcium: 8.4 mg/dL — ABNORMAL LOW (ref 8.9–10.3)
Chloride: 102 mmol/L (ref 98–111)
Creatinine, Ser: <0.3 mg/dL — ABNORMAL LOW (ref 0.61–1.24)
Glucose, Bld: 108 mg/dL — ABNORMAL HIGH (ref 70–99)
Potassium: 3.5 mmol/L (ref 3.5–5.1)
Sodium: 139 mmol/L (ref 135–145)

## 2022-03-11 LAB — CBC
HCT: 34.4 % — ABNORMAL LOW (ref 39.0–52.0)
Hemoglobin: 11 g/dL — ABNORMAL LOW (ref 13.0–17.0)
MCH: 28 pg (ref 26.0–34.0)
MCHC: 32 g/dL (ref 30.0–36.0)
MCV: 87.5 fL (ref 80.0–100.0)
Platelets: 345 10*3/uL (ref 150–400)
RBC: 3.93 MIL/uL — ABNORMAL LOW (ref 4.22–5.81)
RDW: 17.9 % — ABNORMAL HIGH (ref 11.5–15.5)
WBC: 14.5 10*3/uL — ABNORMAL HIGH (ref 4.0–10.5)
nRBC: 0 % (ref 0.0–0.2)

## 2022-03-11 LAB — LACTIC ACID, PLASMA: Lactic Acid, Venous: 1 mmol/L (ref 0.5–1.9)

## 2022-03-11 LAB — MRSA NEXT GEN BY PCR, NASAL: MRSA by PCR Next Gen: NOT DETECTED

## 2022-03-11 LAB — PROCALCITONIN: Procalcitonin: <0.1 ng/mL

## 2022-03-11 MED ORDER — ALBUTEROL SULFATE (2.5 MG/3ML) 0.083% IN NEBU
2.5000 mg | INHALATION_SOLUTION | RESPIRATORY_TRACT | Status: DC | PRN
Start: 2022-03-11 — End: 2022-03-13

## 2022-03-11 MED ORDER — SODIUM CHLORIDE 0.9 % IV SOLN
500.0000 mg | INTRAVENOUS | Status: DC
  Administered 2022-03-11 – 2022-03-12 (×2): 500 mg via INTRAVENOUS
  Filled 2022-03-11 (×2): qty 5

## 2022-03-11 NOTE — ED Notes (Signed)
Informed RN bed assigned 

## 2022-03-11 NOTE — Assessment & Plan Note (Signed)
Outpatient ultrasound

## 2022-03-11 NOTE — ED Notes (Signed)
Called Dietary regarding feeding supplement.  Sts they will send it up.

## 2022-03-11 NOTE — ED Notes (Signed)
[name]/caregiver sts he can only tolerate vegan feeding supplements.  Sts he cannot tolerate milk or lactose free supplements.
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
## 2022-03-11 NOTE — ED Notes (Signed)
Lab made aware of add-on procalcitonin.

## 2022-03-11 NOTE — ED Notes (Signed)
Followed up w/ Dietary again d/t supplement not arriving. Sts they will send it up.

## 2022-03-11 NOTE — ED Notes (Signed)
Pt and family refusing night meds and feedings due to wanting to sleep. Pt and family educated on importance of continuing medications as prescribed. Pt continues to refuse these things.

## 2022-03-11 NOTE — Assessment & Plan Note (Signed)
Meets criteria with BMI greater than 30 ?

## 2022-03-12 DIAGNOSIS — E669 Obesity, unspecified: Secondary | ICD-10-CM | POA: Diagnosis not present

## 2022-03-12 DIAGNOSIS — J690 Pneumonitis due to inhalation of food and vomit: Secondary | ICD-10-CM | POA: Diagnosis not present

## 2022-03-12 DIAGNOSIS — G8114 Spastic hemiplegia affecting left nondominant side: Secondary | ICD-10-CM | POA: Diagnosis not present

## 2022-03-12 DIAGNOSIS — M62838 Other muscle spasm: Secondary | ICD-10-CM | POA: Diagnosis not present

## 2022-03-12 DIAGNOSIS — J9601 Acute respiratory failure with hypoxia: Secondary | ICD-10-CM | POA: Diagnosis not present

## 2022-03-12 MED ORDER — CEFPODOXIME PROXETIL 200 MG PO TABS: 200.0000 mg | ORAL_TABLET | Freq: Two times a day (BID) | ORAL | 0 refills | Status: DC

## 2022-03-12 MED ORDER — GUAIFENESIN 200 MG/5ML PO LIQD: 600.0000 mg | Freq: Two times a day (BID) | ORAL | 0 refills | Status: DC

## 2022-03-12 MED ORDER — PROSOURCE TF20 ENFIT COMPATIBL EN LIQD
60.0000 mL | Freq: Two times a day (BID) | ENTERAL | Status: DC
  Administered 2022-03-12: 60 mL
  Filled 2022-03-12: qty 60

## 2022-03-12 NOTE — Care Management Important Message (Signed)
Important Message  Patient Details  Name: Austin Conner MRN: [id number] Date of Birth: 29-Mar-1956   Medicare Important Message Given:  N/A - LOS <3 / Initial given by admissions     Dannette Barbara 03/12/2022, 11:52 AM

## 2022-03-12 NOTE — Plan of Care (Signed)
  Problem: Health Behavior/Discharge Planning: Goal: Ability to manage health-related needs will improve Outcome: Progressing   Problem: Clinical Measurements: Goal: Ability to maintain clinical measurements within normal limits will improve Outcome: Progressing   Problem: Clinical Measurements: Goal: Will remain free from infection Outcome: Progressing   

## 2022-03-12 NOTE — TOC Progression Note (Signed)
Transition of Care [facility]) - Progression Note    Patient Details  Name: Austin Conner MRN: [id number] Date of Birth: [birth year]  Transition of Care [facility]) CM/SW Contact  Beverly Sessions, RN Phone Number: 03/12/2022, 3:54 PM  Clinical Narrative:      Admitted for: acute hypoxic respiratory event Admitted from: home with wife  [id number]  Current home health/prior home health/DME:Hospital bed, lift, tube feeds, motorized WC, RW Open with [facility] PT OT ST  Will require EMS transport at discharge [place] notified of discharge  Nasotracheal suctioning taught by RT.  12 french caths ordered through [employer].  Bedside RN to send home t3 caths with patient        Expected Discharge Plan and Services           Expected Discharge Date: 03/12/22                                     Social Determinants of Health (SDOH) Interventions    Readmission Risk Interventions    03/12/2022    3:53 PM  Readmission Risk Prevention Plan  Transportation Screening Complete  Medication Review [employer]) Complete  SW Recovery Care/Counseling Consult Complete  [place] Not Applicable

## 2022-03-12 NOTE — Progress Notes (Signed)
Pt's family member instructed on NT suctioning. Wife felt comfortable after instruction and Case management made aware.

## 2022-03-12 NOTE — Discharge Summary (Signed)
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 not intended to diagnose MRSA infection nor to guide or monitor treatment for MRSA infections. Test performance is not FDA approved in patients less than 34 years old. Performed at [facility], [address] [place], [place] [address]   Culture, Respiratory w Gram Stain     Status: None (Preliminary result)   Collection Time: 03/10/22  6:08 PM   Specimen: Tracheal Aspirate  Result Value Ref Range Status   Specimen Description   Final    TRACHEAL ASPIRATE Performed at [facility], [address] [place], [place] [address]    Special Requests   Final    NONE Performed at [facility], [address] [place], [place] [address]    Gram Stain   Final    ABUNDANT WBC PRESENT, PREDOMINANTLY PMN ABUNDANT GRAM NEGATIVE RODS RARE GRAM POSITIVE COCCI IN PAIRS    Culture   Final    ABUNDANT CITROBACTER KOSERI SUSCEPTIBILITIES TO FOLLOW Performed at [facility], [address] [place], [place] [address]    Report Status PENDING  Incomplete  SARS Coronavirus 2 by RT PCR (hospital order, performed in [facility] hospital lab) *cepheid single result test*  Anterior Nasal Swab     Status: None   Collection Time: 03/10/22  6:49 PM   Specimen: Anterior Nasal Swab  Result Value Ref Range Status   SARS Coronavirus 2 by RT PCR NEGATIVE NEGATIVE Final    Comment: (NOTE) SARS-CoV-2 target nucleic acids are NOT DETECTED.  The SARS-CoV-2 RNA is generally detectable in upper and lower respiratory specimens during the acute phase of infection. The lowest concentration of SARS-CoV-2 viral copies this assay can detect is 250 copies / mL. A negative result does not preclude SARS-CoV-2 infection and should not be used as the sole basis for treatment or other patient management decisions.  A negative result may occur with improper specimen collection / handling, submission of specimen other than nasopharyngeal swab, presence of viral mutation(s) within the areas targeted by this assay, and inadequate number of viral copies (<250 copies / mL). A negative result must be combined with clinical observations, patient history, and epidemiological information.  Fact Sheet for  Patients:   https://www.patel.info/  Fact Sheet for Healthcare Providers: https://hall.com/  This test is not yet approved or  cleared by the [place] FDA and has been authorized for detection and/or diagnosis of SARS-CoV-2 by FDA under an Emergency Use Authorization (EUA).  This EUA will remain in effect (meaning this test can be used) for the duration of the COVID-19 declaration under Section 564(b)(1) of the Act, 21 U.S.C. section 360bbb-3(b)(1), unless the authorization is terminated or revoked sooner.  Performed at [facility], [address] [place], [place] [address]   Blood culture (routine x 2)     Status: None (Preliminary result)   Collection Time: 03/10/22  6:49 PM   Specimen: BLOOD  Result Value Ref Range Status   Specimen Description BLOOD BLOOD RIGHT FOREARM  Final   Special Requests   Final    BOTTLES  DRAWN AEROBIC AND ANAEROBIC Blood Culture adequate volume   Culture   Final    NO GROWTH 2 DAYS Performed at [facility], [address] [place], [place] [address]    Report Status PENDING  Incomplete  Blood culture (routine x 2)     Status: None (Preliminary result)   Collection Time: 03/10/22  6:50 PM   Specimen: BLOOD  Result Value Ref Range Status   Specimen Description BLOOD BLOOD LEFT FOREARM  Final   Special Requests   Final    BOTTLES DRAWN AEROBIC AND ANAEROBIC Blood Culture results may not be optimal due to an excessive volume of blood received in culture bottles   Culture   Final    NO GROWTH 2 DAYS Performed at [facility], [address] [place], [place] [address]    Report Status PENDING  Incomplete    Labs: CBC: Recent Labs  Lab 03/10/22 1505 03/11/22 0556  WBC 9.3 14.5*  NEUTROABS 8.4*  --   HGB 13.0 11.0*  HCT 40.0 34.4*  MCV 87.3 87.5  PLT 364 283   Basic Metabolic Panel: Recent Labs  Lab 03/10/22 1505 03/11/22 0556  NA 140 139  K 4.4 3.5  CL 103 102  CO2 29 32  GLUCOSE 173* 108*  BUN 22 16  CREATININE 0.31* <0.30*  CALCIUM 8.9 8.4*   Liver Function Tests: No results for input(s): "AST", "ALT", "ALKPHOS", "BILITOT", "PROT", "ALBUMIN" in the last 168 hours. CBG: No results for input(s): "GLUCAP" in the last 168 hours.  Discharge time spent: less than 30 minutes.  Signed: Annita Brod, MD [facility] 03/12/2022

## 2022-03-13 ENCOUNTER — Telehealth: Payer: Self-pay

## 2022-03-13 ENCOUNTER — Inpatient Hospital Stay: Payer: Medicare Other | Admitting: Nurse Practitioner

## 2022-03-13 LAB — CULTURE, RESPIRATORY W GRAM STAIN

## 2022-03-13 NOTE — Telephone Encounter (Signed)
Noted. Thanks.

## 2022-03-13 NOTE — Telephone Encounter (Signed)
Conversation was with pt's sister, [name]. She has transportation set up for hospital f/u apt on 8/21. She reports the last apt he had the transportation company backed out at the last minute saying they did not have enough employees.   Transition Care Management Follow-up Telephone Call Date of discharge and from where: [facility] 8/18 How have you been since you were released from the hospital? Both are tired, but pt is doing better since home Any questions or concerns? No  Items Reviewed: Did the pt receive and understand the discharge instructions provided? Yes  Medications obtained and verified? Yes  Other? No  Any new allergies since your discharge? No  Dietary orders reviewed? No Do you have support at home? Yes   Home Care and Equipment/Supplies: Were home health services ordered? yes If so, what is the name of the agency? [facility] Has the agency set up a time to come to the patient's home? yes Were any new equipment or medical supplies ordered?  No What is the name of the medical supply agency? N/a Were you able to get the supplies/equipment? not applicable Do you have any questions related to the use of the equipment or supplies? No  Functional Questionnaire: (I = Independent and D = Dependent) ADLs: D  Bathing/Dressing- D  Meal Prep- D  Eating- D  Maintaining continence- D  Transferring/Ambulation- D  Managing Meds- D  Follow up appointments reviewed:  PCP Hospital f/u appt confirmed? Yes  Scheduled to see Romilda Garret, NP on 03/16/22 @ 1140. [facility] f/u appt confirmed? No  Sister will contact pulmonary. Are transportation arrangements needed? No  If their condition worsens, is the pt aware to call PCP or go to the Emergency Dept.? Yes Was the patient provided with contact information for the PCP's office or ED? Yes Was to pt encouraged to call back with questions or concerns? Yes

## 2022-03-13 NOTE — Progress Notes (Signed)
Pt discharge packet was given to the EMS with family member by the pt bedside. Pt personal belongings were packed by the family member. Pt was A/O, RA and pt breathing was symmetrical and unlabored. EMS received pt previous vital signs. PIV removed by AM nurse which was clean, dry, intact.

## 2022-03-15 LAB — CULTURE, BLOOD (ROUTINE X 2)
Culture: NO GROWTH
Culture: NO GROWTH
Special Requests: ADEQUATE

## 2022-03-16 ENCOUNTER — Encounter: Payer: Self-pay | Admitting: Nurse Practitioner

## 2022-03-16 ENCOUNTER — Ambulatory Visit (INDEPENDENT_AMBULATORY_CARE_PROVIDER_SITE_OTHER): Payer: Medicare Other | Admitting: Nurse Practitioner

## 2022-03-16 VITALS — BP 132/94 | HR 68 | Temp 97.5°F

## 2022-03-16 DIAGNOSIS — J690 Pneumonitis due to inhalation of food and vomit: Secondary | ICD-10-CM | POA: Diagnosis not present

## 2022-03-16 DIAGNOSIS — G8114 Spastic hemiplegia affecting left nondominant side: Secondary | ICD-10-CM

## 2022-03-16 DIAGNOSIS — Z760 Encounter for issue of repeat prescription: Secondary | ICD-10-CM | POA: Diagnosis not present

## 2022-03-16 DIAGNOSIS — S069XAS Unspecified intracranial injury with loss of consciousness status unknown, sequela: Secondary | ICD-10-CM | POA: Diagnosis not present

## 2022-03-16 DIAGNOSIS — Z931 Gastrostomy status: Secondary | ICD-10-CM | POA: Insufficient documentation

## 2022-03-16 DIAGNOSIS — Z09 Encounter for follow-up examination after completed treatment for conditions other than malignant neoplasm: Secondary | ICD-10-CM | POA: Insufficient documentation

## 2022-03-16 MED ORDER — PANTOPRAZOLE SODIUM 40 MG PO PACK: 20.0000 mg | PACK | Freq: Two times a day (BID) | ORAL | 2 refills | Status: AC

## 2022-03-16 NOTE — Assessment & Plan Note (Addendum)
Patient here today after follow-up for follow-up for 2 hospital admissions doing with acute respiratory failure secondary to aspiration pneumonia.  Patient subsequently had a PEG tube placed.  Did review both admission the ED and discharge notes along with labs and scans.  We will hold patient's duloxetine has not been able to take it as of late as the crystals will clump up.  Patient states that 6 of her restless leg syndrome.  Patient's iron level has been low did refer to heme-onc but has not been seen yet.  Patient is can restart an over-the-counter vitamin has small amounts of iron.  We will see how he tolerates that prior to making medication changes

## 2022-03-16 NOTE — Assessment & Plan Note (Signed)
Continue following with neurology as scheduled.  Getting injections within the next few weeks

## 2022-03-16 NOTE — Progress Notes (Signed)
Established Patient Office Visit  Subjective   Patient ID: Austin Conner, male    DOB: 1956-04-08  Age: 66 y.o. MRN: [id number]  Chief Complaint  Patient presents with   Hospitalization Follow-up    HPI  Patients sister Austin Conner at bedside to help with [facility] follow up: Patient was in the hospital on 02/25/2022 after patient Sister Austin Conner checked his O2 saturation was 81% on room air and they did do a portable view x-ray that showed atelectasis or infiltrates at the lung bases increased on the right side.  Patient was placed in the hospital for admission.  Patient was discharged on 03/02/2022 at the time of discharge patient was on room air they do have been following up with Dr. Lanney Gins, when allergy he also had a PEG tube placed due to to silent aspiration.  Patient presented to the emergency department on 03/10/2022 when patient's caregiver noticed he was having increased secretions with a cough and checked his oxygen saturation which is around 88% while in the emergency department and had increased work of breathing and was requiring 4 L of nasal cannula oxygen.  They did do a CT Noncon demonstrating ongoing lower lobe opacities consistent with aspiration he was started on vancomycin and Zosyn and admitted to the hospital.  She was discharged on 03/12/2022 with resume home health PT/OT/speech therapy.  He was discharged on Ceftin approximating for 3 days to complete a 7-day course.  Patient is here today for emergency department follow-up  CT Noncon chest recommended 9-monthfollow-up with him for incomplete resolution.  This shows stable cardiomegaly and a 2 cm incidental right thyroid nodule recommended ultrasound follow-up     Review of Systems  Constitutional:  Negative for chills and fever.  Respiratory:  Positive for cough. Negative for shortness of breath.   Cardiovascular:  Negative for chest pain.  Gastrointestinal:  Negative for abdominal pain, constipation, diarrhea, nausea  and vomiting.  Neurological:  Positive for focal weakness.      Objective:     BP (!) 132/94   Pulse 68   Temp (!) 97.5 F (36.4 C)   SpO2 95%    Physical Exam Vitals and nursing note reviewed.  Constitutional:      Appearance: Normal appearance.     Comments: Patient in stretcher.   Cardiovascular:     Rate and Rhythm: Normal rate and regular rhythm.     Heart sounds: Normal heart sounds.  Pulmonary:     Effort: Pulmonary effort is normal.     Comments: Decreased in RLL Abdominal:     General: Bowel sounds are normal. There is no distension.     Comments: PEG tube in place  Lymphadenopathy:     Cervical: No cervical adenopathy.  Neurological:     Mental Status: He is alert.      No results found for any visits on 03/16/22.    The 10-year ASCVD risk score (Arnett DK, et al., 2019) is: 17.7%    Assessment & Plan:   Problem List Items Addressed This Visit       Respiratory   Aspiration pneumonia ([facility] - Primary    Patient has had recurrent hospitalizations for aspiration pneumonia.  As of late he has had a PEG tube placed.  Follow-up in office today.  Per last note pulmonology wanted patient to use at home incentive spirometry was not discharged with 1.  DME order given today      Relevant Orders   For  home use only DME Other see comment   CBC   Comprehensive metabolic panel     Nervous and Auditory   Spastic hemiplegia affecting left nondominant side (HCC)    Continue following with neurology as scheduled.  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  Patient subsequently had a PEG tube placed.  Did review both admission the ED and discharge notes along with labs and scans.  We will hold patient's duloxetine  has not been able to take it as of late as the crystals will clump up.  Patient states that 6 of her restless leg syndrome.  Patient's iron level has been low did refer to heme-onc but has not been seen yet.  Patient is can restart an over-the-counter vitamin has small amounts of iron.  We will see how he tolerates that prior to making medication changes      Relevant Orders   CBC   Comprehensive metabolic panel   S/P percutaneous endoscopic gastrostomy (PEG) tube placement [facility])   Relevant Orders   Ambulatory referral to Gastroenterology   Other Visit Diagnoses     Medication refill       Relevant Medications   pantoprazole sodium (PROTONIX) 40 mg       Return in about 3 months (around 06/16/2022) for recheck on chronic conditions.    Romilda Garret, NP

## 2022-03-16 NOTE — Patient Instructions (Signed)
Nice to see you guys today I want to see him in 3 months to see how he is doing. We will try and get the CT scan before you see me in office Follow up sooner if you need me I resent the protonix that way the paperwork will go to me instead of the hospital doctor  I want you to use the Incentive spirometer 3-4 times a day

## 2022-03-16 NOTE — Assessment & Plan Note (Signed)
Patient has had recurrent hospitalizations for aspiration pneumonia.  As of late he has had a PEG tube placed.  Follow-up in office today.  Per last note pulmonology wanted patient to use at home incentive spirometry was not discharged with 1.  DME order given today

## 2022-03-17 ENCOUNTER — Other Ambulatory Visit: Payer: Self-pay | Admitting: Nurse Practitioner

## 2022-03-17 ENCOUNTER — Telehealth: Payer: Self-pay | Admitting: Nurse Practitioner

## 2022-03-17 DIAGNOSIS — G2581 Restless legs syndrome: Secondary | ICD-10-CM

## 2022-03-17 DIAGNOSIS — Z7401 Bed confinement status: Secondary | ICD-10-CM

## 2022-03-17 DIAGNOSIS — E042 Nontoxic multinodular goiter: Secondary | ICD-10-CM

## 2022-03-17 DIAGNOSIS — S069X0S Unspecified intracranial injury without loss of consciousness, sequela: Secondary | ICD-10-CM

## 2022-03-17 DIAGNOSIS — Z87820 Personal history of traumatic brain injury: Secondary | ICD-10-CM

## 2022-03-17 DIAGNOSIS — Z9181 History of falling: Secondary | ICD-10-CM

## 2022-03-17 DIAGNOSIS — Z9049 Acquired absence of other specified parts of digestive tract: Secondary | ICD-10-CM

## 2022-03-17 DIAGNOSIS — J690 Pneumonitis due to inhalation of food and vomit: Secondary | ICD-10-CM | POA: Diagnosis not present

## 2022-03-17 DIAGNOSIS — Z931 Gastrostomy status: Secondary | ICD-10-CM

## 2022-03-17 DIAGNOSIS — K317 Polyp of stomach and duodenum: Secondary | ICD-10-CM

## 2022-03-17 DIAGNOSIS — G4089 Other seizures: Secondary | ICD-10-CM

## 2022-03-17 DIAGNOSIS — R1310 Dysphagia, unspecified: Secondary | ICD-10-CM

## 2022-03-17 DIAGNOSIS — D509 Iron deficiency anemia, unspecified: Secondary | ICD-10-CM | POA: Diagnosis not present

## 2022-03-17 DIAGNOSIS — Z7951 Long term (current) use of inhaled steroids: Secondary | ICD-10-CM

## 2022-03-17 DIAGNOSIS — K219 Gastro-esophageal reflux disease without esophagitis: Secondary | ICD-10-CM

## 2022-03-17 DIAGNOSIS — I712 Thoracic aortic aneurysm, without rupture, unspecified: Secondary | ICD-10-CM

## 2022-03-17 DIAGNOSIS — N400 Enlarged prostate without lower urinary tract symptoms: Secondary | ICD-10-CM | POA: Diagnosis not present

## 2022-03-17 DIAGNOSIS — G8194 Hemiplegia, unspecified affecting left nondominant side: Secondary | ICD-10-CM

## 2022-03-17 DIAGNOSIS — I10 Essential (primary) hypertension: Secondary | ICD-10-CM | POA: Diagnosis not present

## 2022-03-17 NOTE — Telephone Encounter (Signed)
Spoke with Austin Conner and discussed medication. She is wondering if patient can just not take Cymbalta at this time and nothing else in place of it either. This medication has been making patient drowsy since taking this medication and Austin Conner wants him to be aware of things and drugged out. Is this ok?

## 2022-03-17 NOTE — Telephone Encounter (Signed)
Spoke yesterday about Austin Conner ot being able to give Austin Conner his Cymbalta (duloxetine) due to the crystals clumping in his tube. I did not think about this but they make a tablet version of Effexor (venlafaxine) that we can switch him to if they are ok with that. It is in the same class of medication and kinda like a brother to Cymbalta

## 2022-03-17 NOTE — Progress Notes (Signed)
Error

## 2022-03-18 ENCOUNTER — Telehealth: Payer: Self-pay | Admitting: Nurse Practitioner

## 2022-03-18 NOTE — Telephone Encounter (Signed)
[place] Name: [facility] Agency Name: Lanny Cramp Callback Phone #: [phone] SECURE LINE  Service Requested: OT (examples: OT/PT/Skilled Nursing/Social Work/Speech Therapy/Wound Care)  Frequency of Visits: 1 time 6

## 2022-03-18 NOTE — Telephone Encounter (Signed)
[name] advised.

## 2022-03-18 NOTE — Telephone Encounter (Signed)
Not taking the medication is fine. Sometimes people feel bad coming off the medication. Just monitor how he is doing. Some symptoms are headache and nausea

## 2022-03-18 NOTE — Telephone Encounter (Signed)
Please verbally ok those orders 

## 2022-03-19 ENCOUNTER — Telehealth: Payer: Self-pay

## 2022-03-19 ENCOUNTER — Telehealth: Payer: Self-pay | Admitting: Nurse Practitioner

## 2022-03-19 DIAGNOSIS — R051 Acute cough: Secondary | ICD-10-CM

## 2022-03-19 MED ORDER — GUAIFENESIN 200 MG/5ML PO LIQD: 600.0000 mg | Freq: Two times a day (BID) | ORAL | 0 refills | Status: AC

## 2022-03-19 NOTE — Telephone Encounter (Signed)
We can repeat for another 10 days and then reassess. Script sent in

## 2022-03-19 NOTE — Telephone Encounter (Signed)
[name] advised.

## 2022-03-19 NOTE — Telephone Encounter (Signed)
Referral faxed over as requested

## 2022-03-19 NOTE — Telephone Encounter (Signed)
Called and spoke with stephaine gave verbal okay

## 2022-03-19 NOTE — Telephone Encounter (Signed)
[name] states that while at the hospital patient's Guaifenesin was written to take for 10 days on the prescription, 10th day will be this Sunday 27th. [name] wanted to check and see if patient needs to take this for 10 more days to get his lungs more time to clear up the mucus?

## 2022-03-19 NOTE — Telephone Encounter (Signed)
Patient sister [name] called in regarding a referral. She stated she spoke with Dr. Lanney Gins office and the referral needs to be sent over to the attention of Ulis Rias at [phone]. Stated they get a lot of fax everyday but can be sent directly to her so he can be scheduled. Thank you!

## 2022-03-20 ENCOUNTER — Telehealth: Payer: Self-pay

## 2022-03-20 NOTE — Telephone Encounter (Signed)
Left detailed message on voicemail as instructed below.

## 2022-03-20 NOTE — Telephone Encounter (Signed)
Yes please thank you.  Update [place] with what they find

## 2022-03-20 NOTE — Telephone Encounter (Signed)
Received a call from [facility]. Patient reported a wound, Selena wants verbal okay to go out to see and treat the wound if possible.

## 2022-03-25 ENCOUNTER — Telehealth: Payer: Self-pay | Admitting: Nurse Practitioner

## 2022-03-25 NOTE — Telephone Encounter (Signed)
Home Health verbal orders [place] Name: Austin Conner number: [phone]  Requesting OT/PT/Skilled nursing/Social Work/Speech:skilled nursing  Reason:wound on left buttocks Treat with zinc paste,dressing  Frequency:once wk for 9 wks  Please forward to [facility] pool or providers CMA

## 2022-03-25 NOTE — Telephone Encounter (Signed)
Please ok those verbal orders Cc to pcp

## 2022-03-25 NOTE — Telephone Encounter (Signed)
VO given to [place]

## 2022-03-27 ENCOUNTER — Telehealth: Payer: Self-pay

## 2022-03-27 NOTE — Telephone Encounter (Signed)
PA for Pantoprazole packets submitted via covermymeds.  Key: [id number] Pending decision.

## 2022-03-27 NOTE — Telephone Encounter (Signed)
Pt was seen by Hematology [place] 02/26/2022 CT scan completed on 03/10/2022  Nothing further needed.

## 2022-04-01 ENCOUNTER — Ambulatory Visit: Payer: Self-pay | Admitting: Neurology

## 2022-04-01 ENCOUNTER — Encounter: Payer: Self-pay | Admitting: Nurse Practitioner

## 2022-04-01 VITALS — BP 155/81 | HR 77

## 2022-04-01 DIAGNOSIS — R1311 Dysphagia, oral phase: Secondary | ICD-10-CM | POA: Diagnosis not present

## 2022-04-01 DIAGNOSIS — M24542 Contracture, left hand: Secondary | ICD-10-CM | POA: Diagnosis not present

## 2022-04-01 DIAGNOSIS — M6281 Muscle weakness (generalized): Secondary | ICD-10-CM | POA: Diagnosis not present

## 2022-04-01 DIAGNOSIS — G8250 Quadriplegia, unspecified: Secondary | ICD-10-CM | POA: Diagnosis not present

## 2022-04-01 DIAGNOSIS — J690 Pneumonitis due to inhalation of food and vomit: Secondary | ICD-10-CM

## 2022-04-01 DIAGNOSIS — S0990XS Unspecified injury of head, sequela: Secondary | ICD-10-CM

## 2022-04-01 DIAGNOSIS — M62838 Other muscle spasm: Secondary | ICD-10-CM

## 2022-04-01 DIAGNOSIS — S069XAS Unspecified intracranial injury with loss of consciousness status unknown, sequela: Secondary | ICD-10-CM

## 2022-04-01 DIAGNOSIS — S062X9D Diffuse traumatic brain injury with loss of consciousness of unspecified duration, subsequent encounter: Secondary | ICD-10-CM | POA: Diagnosis not present

## 2022-04-01 DIAGNOSIS — G8114 Spastic hemiplegia affecting left nondominant side: Secondary | ICD-10-CM

## 2022-04-01 MED ORDER — INCOBOTULINUMTOXINA 100 UNITS IM SOLR
600.0000 [IU] | INTRAMUSCULAR | Status: AC
  Administered 2022-04-01: 600 [IU] via INTRAMUSCULAR

## 2022-04-01 MED ORDER — INCOBOTULINUMTOXINA 100 UNITS IM SOLR: 600.0000 [IU] | INTRAMUSCULAR | Status: AC

## 2022-04-01 NOTE — Telephone Encounter (Signed)
Is this ok? I can place the order if approved. I will need to know which diagnoses to use

## 2022-04-01 NOTE — Progress Notes (Signed)
Xeomin 100 x 6 vials  [id number] Exp-02-2024 B/B

## 2022-04-01 NOTE — Telephone Encounter (Signed)
I am ok for you to place this order.   We can do the ones she asked for and give her 30 day supply with one catheter as needed  We can use the dx of aspiration, quadriplegia, and late effect of a traumatic brain injury
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
## 2022-04-01 NOTE — Telephone Encounter (Signed)
PA approved thru 03/28/2023. PA authorization faxed over to pharmacy

## 2022-04-02 ENCOUNTER — Telehealth: Payer: Self-pay | Admitting: Nurse Practitioner

## 2022-04-02 NOTE — Telephone Encounter (Signed)
Order faxed over to [facility] and Ms [name] advised

## 2022-04-02 NOTE — Telephone Encounter (Signed)
Pt sister [name] called in wants to know if the home health nurse can give pt the flu shot and would he need a scrip for it . Please advise [phone]

## 2022-04-02 NOTE — Telephone Encounter (Signed)
Called [name] but voicemail was not set up, sent mychart message to advise that patient should get Flu shot and if Home health need order for this to let us know.

## 2022-04-02 NOTE — Telephone Encounter (Signed)
Spoke with patient and relayed message below. She will reach back out to [facility] to check about the flu shot. Thank you!

## 2022-04-03 DIAGNOSIS — J690 Pneumonitis due to inhalation of food and vomit: Secondary | ICD-10-CM | POA: Diagnosis not present

## 2022-04-05 DIAGNOSIS — M24542 Contracture, left hand: Secondary | ICD-10-CM | POA: Diagnosis not present

## 2022-04-05 DIAGNOSIS — R1311 Dysphagia, oral phase: Secondary | ICD-10-CM | POA: Diagnosis not present

## 2022-04-05 DIAGNOSIS — Z930 Tracheostomy status: Secondary | ICD-10-CM | POA: Diagnosis not present

## 2022-04-05 DIAGNOSIS — S062X9D Diffuse traumatic brain injury with loss of consciousness of unspecified duration, subsequent encounter: Secondary | ICD-10-CM | POA: Diagnosis not present

## 2022-04-06 NOTE — Telephone Encounter (Signed)
Pt Sister [name] call in requesting a call back regarding pt hospital bed # [phone]

## 2022-04-08 DIAGNOSIS — T17908A Unspecified foreign body in respiratory tract, part unspecified causing other injury, initial encounter: Secondary | ICD-10-CM | POA: Diagnosis not present

## 2022-04-08 DIAGNOSIS — J90 Pleural effusion, not elsewhere classified: Secondary | ICD-10-CM | POA: Diagnosis not present

## 2022-04-08 DIAGNOSIS — J9811 Atelectasis: Secondary | ICD-10-CM | POA: Diagnosis not present

## 2022-04-08 DIAGNOSIS — J849 Interstitial pulmonary disease, unspecified: Secondary | ICD-10-CM | POA: Diagnosis not present

## 2022-04-08 DIAGNOSIS — R053 Chronic cough: Secondary | ICD-10-CM | POA: Diagnosis not present

## 2022-04-10 ENCOUNTER — Telehealth: Payer: Self-pay | Admitting: Nurse Practitioner

## 2022-04-10 NOTE — Telephone Encounter (Signed)
If I signed it it should have gone through,  my in box was done before I left the office. If not, I did not get it

## 2022-04-10 NOTE — Telephone Encounter (Signed)
Tika from [facility] was following up on a HH order that should of been signed by Dr. Glori Bickers. It was sent over on 04/03/2022 and order number was [id number]. Thank you!

## 2022-04-15 ENCOUNTER — Telehealth: Payer: Self-pay

## 2022-04-15 ENCOUNTER — Telehealth: Payer: Self-pay | Admitting: Nurse Practitioner

## 2022-04-15 DIAGNOSIS — Z931 Gastrostomy status: Secondary | ICD-10-CM

## 2022-04-15 NOTE — Addendum Note (Signed)
Addended by: Kris Mouton on: 04/15/2022 03:02 PM   Modules accepted: Orders

## 2022-04-15 NOTE — Telephone Encounter (Signed)
Patient sister [name] called in and stated they need a script for Tri-Flo Suction Catheter 10 FR. She stated it can be sent over to [facility]. It can be faxed over to [phone] Attn: Kasandra Knudsen. Thank you!

## 2022-04-15 NOTE — Telephone Encounter (Signed)
Spoke with [facility] to see if they received our referral from August for PEG tube management. Per their office they did not receive this referral but also was advised they would not be seen a patient for this it would need to go to surgeon office. Pended referral is ok to proceed.

## 2022-04-15 NOTE — Telephone Encounter (Signed)
Spoke with [name]. [facility] was not able to help with suction catheter and would like to try [facility]. Order re faxed to them

## 2022-04-17 NOTE — Telephone Encounter (Signed)
signed

## 2022-04-20 ENCOUNTER — Ambulatory Visit: Payer: Medicare Other | Admitting: Nurse Practitioner

## 2022-04-21 ENCOUNTER — Encounter: Payer: Self-pay | Admitting: Nurse Practitioner

## 2022-04-23 NOTE — Telephone Encounter (Signed)
They resent it and form placed in your inbox to sign

## 2022-04-24 ENCOUNTER — Ambulatory Visit: Payer: Medicare Other | Admitting: Nurse Practitioner

## 2022-04-24 ENCOUNTER — Telehealth: Payer: Self-pay | Admitting: Nurse Practitioner

## 2022-04-24 NOTE — Telephone Encounter (Signed)
Colletta Maryland from [facility] called and stated patient missed appointment on yesterday and do a recertification visit for next week for occupational therapy. Call back number [phone].

## 2022-04-24 NOTE — Telephone Encounter (Signed)
I will cc to pcp

## 2022-04-24 NOTE — Telephone Encounter (Signed)
Done and in IN box Cc to pcp

## 2022-04-24 NOTE — Telephone Encounter (Signed)
Form faxed and sent to scanning

## 2022-04-27 ENCOUNTER — Telehealth: Payer: Self-pay | Admitting: Nurse Practitioner

## 2022-04-27 DIAGNOSIS — Z23 Encounter for immunization: Secondary | ICD-10-CM

## 2022-04-27 MED ORDER — FLUZONE HIGH-DOSE 0.5 ML IM SUSY: 0.5000 mL | PREFILLED_SYRINGE | Freq: Once | INTRAMUSCULAR | 0 refills | Status: AC

## 2022-04-27 NOTE — Telephone Encounter (Signed)
Patient sister [name] called in and stated that [facility] can administer the flu shot for [place]. She stated they will need a order in order to do it. It can be sent over to [facility], but she didn't have the fax number. The script can be sent over to [place], [place]. Thank you!

## 2022-04-27 NOTE — Telephone Encounter (Signed)
Order signed and flu vaccine sent to pharmacy

## 2022-04-27 NOTE — Telephone Encounter (Signed)
Please review, I pended Flu vaccine script and order for the flu vaccine. Order needs to be faxed to [facility] [phone]

## 2022-04-28 ENCOUNTER — Telehealth: Payer: Self-pay | Admitting: Nurse Practitioner

## 2022-04-28 NOTE — Telephone Encounter (Signed)
Alma Friendly from [facility] called and stated that Romilda Garret can now sign off on [facility] orders. She stated an OT order was sent over that needs his signature. The order number is [id number]. Thank you!

## 2022-04-28 NOTE — Telephone Encounter (Signed)
Noted, will keep an eye out on it

## 2022-04-29 ENCOUNTER — Telehealth: Payer: Self-pay | Admitting: Nurse Practitioner

## 2022-04-29 MED ORDER — FLUZONE HIGH-DOSE 0.5 ML IM SUSY: 0.5000 mL | PREFILLED_SYRINGE | Freq: Once | INTRAMUSCULAR | 0 refills | Status: AC

## 2022-04-29 NOTE — Telephone Encounter (Signed)
RX sent to [facility] pharmacy and [name] advised of this on her voicemail.

## 2022-04-29 NOTE — Telephone Encounter (Signed)
Kim form [facility] called in stating that he will not use there service because he has one. Thank you!

## 2022-04-29 NOTE — Telephone Encounter (Signed)
[name], [name] called stating there was a prescription for the pt's flu shot at [facility]. [facility] stated they couldn't release prescription to pt or pt's sister but they administer their own flu clinic there. [name] requested for prescription to be sent to [facility] in [place]. Call back # [phone]

## 2022-04-29 NOTE — Telephone Encounter (Signed)
Noted  

## 2022-05-01 DIAGNOSIS — M24542 Contracture, left hand: Secondary | ICD-10-CM | POA: Diagnosis not present

## 2022-05-01 DIAGNOSIS — M6281 Muscle weakness (generalized): Secondary | ICD-10-CM | POA: Diagnosis not present

## 2022-05-01 DIAGNOSIS — R1311 Dysphagia, oral phase: Secondary | ICD-10-CM | POA: Diagnosis not present

## 2022-05-01 DIAGNOSIS — S062X9D Diffuse traumatic brain injury with loss of consciousness of unspecified duration, subsequent encounter: Secondary | ICD-10-CM | POA: Diagnosis not present

## 2022-05-04 ENCOUNTER — Telehealth: Payer: Self-pay | Admitting: Nurse Practitioner

## 2022-05-04 NOTE — Telephone Encounter (Signed)
Home Health verbal orders Caller Name: [place] Name: [facility] number: [phone]  Requesting Skilled nursing  Frequency: 1x a week for 4 weeks  Please forward to [facility] pool or providers CMA

## 2022-05-05 ENCOUNTER — Ambulatory Visit (INDEPENDENT_AMBULATORY_CARE_PROVIDER_SITE_OTHER): Payer: Medicare Other

## 2022-05-05 ENCOUNTER — Telehealth: Payer: Self-pay | Admitting: Nurse Practitioner

## 2022-05-05 VITALS — Ht 76.0 in | Wt 225.0 lb

## 2022-05-05 DIAGNOSIS — Z0000 Encounter for general adult medical examination without abnormal findings: Secondary | ICD-10-CM

## 2022-05-05 DIAGNOSIS — R1312 Dysphagia, oropharyngeal phase: Secondary | ICD-10-CM

## 2022-05-05 DIAGNOSIS — Z931 Gastrostomy status: Secondary | ICD-10-CM

## 2022-05-05 NOTE — Telephone Encounter (Signed)
Called [name] with [facility] to find out if that is something they can help with. Left detailed message.  [place] Name: [facility] number: [phone]

## 2022-05-05 NOTE — Patient Instructions (Signed)
Austin Conner , Thank you for taking time to come for your Medicare Wellness Visit. I appreciate your ongoing commitment to your health goals. Please review the following plan we discussed and let me know if I can assist you in the future.   Screening recommendations/referrals: Colonoscopy: sister states had in 2017 or 2018 Recommended yearly ophthalmology/optometry visit for glaucoma screening and checkup Recommended yearly dental visit for hygiene and checkup  Vaccinations: Influenza vaccine: completed 04/30/2022 Pneumococcal vaccine: due Tdap vaccine: completed 10/16/2014, due 10/15/2024 Shingles vaccine: discussed   Covid-19:  06/12/2020, 10/26/2019, 10/05/2019  Advanced directives: Please bring a copy of your POA (Power of Attorney) and/or Living Will to your next appointment.   Conditions/risks identified: none  Next appointment: Follow up in one year for your annual wellness visit.   Preventive Care 65 Years and Older, Male Preventive care refers to lifestyle choices and visits with your health care provider that can promote health and wellness. What does preventive care include? A yearly physical exam. This is also called an annual well check. Dental exams once or twice a year. Routine eye exams. Ask your health care provider how often you should have your eyes checked. Personal lifestyle choices, including: Daily care of your teeth and gums. Regular physical activity. Eating a healthy diet. Avoiding tobacco and drug use. Limiting alcohol use. Practicing safe sex. Taking low doses of aspirin every day. Taking vitamin and mineral supplements as recommended by your health care provider. What happens during an annual well check? The services and screenings done by your health care provider during your annual well check will depend on your age, overall health, lifestyle risk factors, and family history of disease. Counseling  Your health care provider may ask you questions about  your: Alcohol use. Tobacco use. Drug use. Emotional well-being. Home and relationship well-being. Sexual activity. Eating habits. History of falls. Memory and ability to understand (cognition). Work and work Statistician. Screening  You may have the following tests or measurements: Height, weight, and BMI. Blood pressure. Lipid and cholesterol levels. These may be checked every 5 years, or more frequently if you are over 65 years old. Skin check. Lung cancer screening. You may have this screening every year starting at age 7 if you have a 30-pack-year history of smoking and currently smoke or have quit within the past 15 years. Fecal occult blood test (FOBT) of the stool. You may have this test every year starting at age 20. Flexible sigmoidoscopy or colonoscopy. You may have a sigmoidoscopy every 5 years or a colonoscopy every 10 years starting at age 58. Prostate cancer screening. Recommendations will vary depending on your family history and other risks. Hepatitis C blood test. Hepatitis B blood test. Sexually transmitted disease (STD) testing. Diabetes screening. This is done by checking your blood sugar (glucose) after you have not eaten for a while (fasting). You may have this done every 1-3 years. Abdominal aortic aneurysm (AAA) screening. You may need this if you are a current or former smoker. Osteoporosis. You may be screened starting at age 15 if you are at high risk. Talk with your health care provider about your test results, treatment options, and if necessary, the need for more tests. Vaccines  Your health care provider may recommend certain vaccines, such as: Influenza vaccine. This is recommended every year. Tetanus, diphtheria, and acellular pertussis (Tdap, Td) vaccine. You may need a Td booster every 10 years. Zoster vaccine. You may need this after age 15. Pneumococcal 13-valent conjugate (PCV13) vaccine.  One dose is recommended after age 10. Pneumococcal  polysaccharide (PPSV23) vaccine. One dose is recommended after age 59. Talk to your health care provider about which screenings and vaccines you need and how often you need them. This information is not intended to replace advice given to you by your health care provider. Make sure you discuss any questions you have with your health care provider. Document Released: 08/09/2015 Document Revised: 04/01/2016 Document Reviewed: 05/14/2015 Elsevier Interactive Patient Education  2017 [employer] Prevention in the Home Falls can cause injuries. They can happen to people of all ages. There are many things you can do to make your home safe and to help prevent falls. What can I do on the outside of my home? Regularly fix the edges of walkways and driveways and fix any cracks. Remove anything that might make you trip as you walk through a door, such as a raised step or threshold. Trim any bushes or trees on the path to your home. Use bright outdoor lighting. Clear any walking paths of anything that might make someone trip, such as rocks or tools. Regularly check to see if handrails are loose or broken. Make sure that both sides of any steps have handrails. Any raised decks and porches should have guardrails on the edges. Have any leaves, snow, or ice cleared regularly. Use sand or salt on walking paths during winter. Clean up any spills in your garage right away. This includes oil or grease spills. What can I do in the bathroom? Use night lights. Install grab bars by the toilet and in the tub and shower. Do not use towel bars as grab bars. Use non-skid mats or decals in the tub or shower. If you need to sit down in the shower, use a plastic, non-slip stool. Keep the floor dry. Clean up any water that spills on the floor as soon as it happens. Remove soap buildup in the tub or shower regularly. Attach bath mats securely with double-sided non-slip rug tape. Do not have throw rugs and other  things on the floor that can make you trip. What can I do in the bedroom? Use night lights. Make sure that you have a light by your bed that is easy to reach. Do not use any sheets or blankets that are too big for your bed. They should not hang down onto the floor. Have a firm chair that has side arms. You can use this for support while you get dressed. Do not have throw rugs and other things on the floor that can make you trip. What can I do in the kitchen? Clean up any spills right away. Avoid walking on wet floors. Keep items that you use a lot in easy-to-reach places. If you need to reach something above you, use a strong step stool that has a grab bar. Keep electrical cords out of the way. Do not use floor polish or wax that makes floors slippery. If you must use wax, use non-skid floor wax. Do not have throw rugs and other things on the floor that can make you trip. What can I do with my stairs? Do not leave any items on the stairs. Make sure that there are handrails on both sides of the stairs and use them. Fix handrails that are broken or loose. Make sure that handrails are as long as the stairways. Check any carpeting to make sure that it is firmly attached to the stairs. Fix any carpet that is loose or  worn. Avoid having throw rugs at the top or bottom of the stairs. If you do have throw rugs, attach them to the floor with carpet tape. Make sure that you have a light switch at the top of the stairs and the bottom of the stairs. If you do not have them, ask someone to add them for you. What else can I do to help prevent falls? Wear shoes that: Do not have high heels. Have rubber bottoms. Are comfortable and fit you well. Are closed at the toe. Do not wear sandals. If you use a stepladder: Make sure that it is fully opened. Do not climb a closed stepladder. Make sure that both sides of the stepladder are locked into place. Ask someone to hold it for you, if possible. Clearly  mark and make sure that you can see: Any grab bars or handrails. First and last steps. Where the edge of each step is. Use tools that help you move around (mobility aids) if they are needed. These include: Canes. Walkers. Scooters. Crutches. Turn on the lights when you go into a dark area. Replace any light bulbs as soon as they burn out. Set up your furniture so you have a clear path. Avoid moving your furniture around. If any of your floors are uneven, fix them. If there are any pets around you, be aware of where they are. Review your medicines with your doctor. Some medicines can make you feel dizzy. This can increase your chance of falling. Ask your doctor what other things that you can do to help prevent falls. This information is not intended to replace advice given to you by your health care provider. Make sure you discuss any questions you have with your health care provider. Document Released: 05/09/2009 Document Revised: 12/19/2015 Document Reviewed: 08/17/2014 Elsevier Interactive Patient Education  2017 [employer].

## 2022-05-05 NOTE — Telephone Encounter (Signed)
Patient sister [name] called in and stated that [facility] cannot manage his G-Tube. She was wanting to know who can manage his G-tube and his nutrition needs. They stated since he had it done at [facility] there should be someone there that can manage it. Please advise. Thank you!

## 2022-05-05 NOTE — Telephone Encounter (Signed)
I am not sure how to find this out. GI at [facility] does not follow patient for this and General surgery does not either.
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
## 2022-05-06 NOTE — Addendum Note (Signed)
Addended by: Michela Pitcher on: 05/06/2022 05:06 PM   Modules accepted: Orders

## 2022-05-06 NOTE — Telephone Encounter (Signed)
We can await the call from [facility]. I did sign an order for the food and pump the other day.   I have talked to out GI and when they manage it, it is from a procedural stand point like endoscopies and such.  Has she not gotten his nutrition?

## 2022-05-06 NOTE — Telephone Encounter (Signed)
Verbal orders ok

## 2022-05-06 NOTE — Telephone Encounter (Signed)
LMTCB

## 2022-05-06 NOTE — Telephone Encounter (Signed)
[name] given verbal orders

## 2022-05-06 NOTE — Telephone Encounter (Signed)
Spoke with Austin Conner today, he spoke with one of the GI providers that advised him they would help with managing G tube and patient if he needs procedures like endoscopy. There is no routine management except for changing the G tube about 3 months and it should be done through Interventional Imaging. Austin Conner with [facility] stated they do not do G tube management aside from showing and discussing how to take care of it that Austin Conner already knows and is doing.  Spoke with Austin Conner explained all of the above. She is concerned that patient is not getting enough nutrients for his body and would like to try nutrition consultation. Advised we have nutrition office that is usually deals with diabetes but may be able to help after reviewing patient's chart. She agreed to trying this. Please refer to nutrition at lifestyle nutrition office through [facility].

## 2022-05-07 ENCOUNTER — Other Ambulatory Visit: Payer: Self-pay | Admitting: Nurse Practitioner

## 2022-05-07 DIAGNOSIS — M62838 Other muscle spasm: Secondary | ICD-10-CM

## 2022-05-11 ENCOUNTER — Telehealth: Payer: Self-pay | Admitting: Nurse Practitioner

## 2022-05-11 NOTE — Telephone Encounter (Signed)
Patient sister called in stating that patient received flu shot on October 5th,,and now he's experiencing diarrhea,fever,cold symptoms,cough. She would like to know is there anything that could be called in for the patient ? Patient gets transported by ambulance to appointments,so it would be difficult for him to come in for an appointment.

## 2022-05-11 NOTE — Telephone Encounter (Signed)
Spoke with Austin Conner, patient had flu shot on 04/30/22, since then-last week had several episodes of diarrhea which has stopped now, had more chest congestion, clammy feeling, temp 99, more labor breathing on 10/12 and 10/13. Patient feels better now, over the weekend improved. Covid tests at home expired so this was not done. Just has more congestion-phlegm producing. Patient has been taking the scheduled Mucinex 10 cc in the morning. Temp today was 98.6. Any suggestions on what else he can take or any other recommendations.

## 2022-05-12 ENCOUNTER — Telehealth: Payer: Self-pay | Admitting: Nurse Practitioner

## 2022-05-12 DIAGNOSIS — Z931 Gastrostomy status: Secondary | ICD-10-CM

## 2022-05-12 MED ORDER — IPRATROPIUM-ALBUTEROL 0.5-2.5 (3) MG/3ML IN SOLN: 3.0000 mL | Freq: Four times a day (QID) | RESPIRATORY_TRACT | 0 refills | Status: AC | PRN

## 2022-05-12 NOTE — Addendum Note (Signed)
Addended by: Michela Pitcher on: 05/12/2022 12:40 PM   Modules accepted: Orders

## 2022-05-12 NOTE — Telephone Encounter (Signed)
Placed the referral for nutrition and they messaged me and stated they did not offer that service. I messaged back to ask which clinic would and did not get a response. Can we see where the referral needs to go please

## 2022-05-12 NOTE — Telephone Encounter (Signed)
Sending to referral pool for assistance. Please update patient's care giver [name] with information found

## 2022-05-12 NOTE — Telephone Encounter (Signed)
Sorry to hear that. Mucinex is the only thing that can help with the congestion medication wise. I can change his nebulizer to a duoneb which has another agent in it that may help with the congestion slightly. This would replace the albuterol inhaled solution

## 2022-05-12 NOTE — Telephone Encounter (Signed)
Provider is trying to find a nutrition for the patient and his caregiver to help them with the amount of liquids/nutrient patient needs to intake now that he has G tube. Any information that could be found on that part?

## 2022-05-12 NOTE — Telephone Encounter (Signed)
Wife called back in ,she was waiting to hear back on any advice for patient as far anything to help with his mucus.

## 2022-05-12 NOTE — Telephone Encounter (Signed)
[name] -sister advised, would like to try Duoneb please send to [facility] pharmacy.

## 2022-05-14 NOTE — Addendum Note (Signed)
Addended by: Michela Pitcher on: 05/14/2022 08:05 AM   Modules accepted: Orders

## 2022-05-14 NOTE — Telephone Encounter (Signed)
HH order placed 

## 2022-05-17 ENCOUNTER — Encounter: Payer: Self-pay | Admitting: Nurse Practitioner

## 2022-05-18 NOTE — Telephone Encounter (Signed)
Spoke with [name] at [facility] and she states she can take a verbal order for UA and Urine culture and go to patient's home to collect and result. Advised I will let [name] review to see if we can do this for the patient and call her back.  [name], please see question about feet symptoms. Thank you

## 2022-05-18 NOTE — Telephone Encounter (Signed)
[name] gave verbal ok to get UA and Culture on the patient. [name] advised. [name] advised of the plan.   Catalina Antigua will respond in regards to the foot issues.

## 2022-05-20 ENCOUNTER — Encounter: Payer: Self-pay | Admitting: Nurse Practitioner

## 2022-05-21 NOTE — Telephone Encounter (Signed)
Orders faxed to [facility] at [phone]

## 2022-05-22 NOTE — Telephone Encounter (Signed)
Pt wife called in to know the status of the order for suction catheter . Please advise [phone]

## 2022-05-25 ENCOUNTER — Telehealth: Payer: Self-pay | Admitting: Nurse Practitioner

## 2022-05-25 DIAGNOSIS — N390 Urinary tract infection, site not specified: Secondary | ICD-10-CM

## 2022-05-25 NOTE — Telephone Encounter (Signed)
Left message for Austin Conner letting her know we have not received any results on the patient and to please look into this and check with their lab on results.

## 2022-05-25 NOTE — Telephone Encounter (Signed)
Marcie Bal from [facility] called over and stated that pateint had UA and CNS done on 05/20/2022 but haven't received any results. She was wanting to know if you have received anything. Please advise. Thank you!

## 2022-05-26 ENCOUNTER — Other Ambulatory Visit: Payer: Self-pay | Admitting: Nurse Practitioner

## 2022-05-26 DIAGNOSIS — I10 Essential (primary) hypertension: Secondary | ICD-10-CM

## 2022-05-26 MED ORDER — SULFAMETHOXAZOLE-TRIMETHOPRIM 800-160 MG PO TABS: 1.0000 | ORAL_TABLET | Freq: Two times a day (BID) | ORAL | 0 refills | Status: DC

## 2022-05-26 NOTE — Addendum Note (Signed)
Addended by: Michela Pitcher on: 05/26/2022 12:53 PM   Modules accepted: Orders

## 2022-05-26 NOTE — Telephone Encounter (Signed)
Left detailed message on Austin Conner office to call back and send results and to give [place] update on this. Called janet also but no answer

## 2022-05-26 NOTE — Telephone Encounter (Signed)
Received results and placed in Austin Conner's inbox to review.

## 2022-05-26 NOTE — Telephone Encounter (Signed)
Ms Parcel, patient's sister, advised.

## 2022-05-26 NOTE — Telephone Encounter (Signed)
Culture reviewed and antibiotic sent to the pharmacy. This will be twice a day for 7 days. This should be crushable if she has trouble we can try to get the liquid.

## 2022-05-27 ENCOUNTER — Telehealth: Payer: Self-pay | Admitting: Nurse Practitioner

## 2022-05-27 ENCOUNTER — Encounter: Payer: Self-pay | Admitting: Nurse Practitioner

## 2022-05-27 DIAGNOSIS — E041 Nontoxic single thyroid nodule: Secondary | ICD-10-CM

## 2022-05-27 DIAGNOSIS — Z8701 Personal history of pneumonia (recurrent): Secondary | ICD-10-CM

## 2022-05-27 MED ORDER — ACETYLCYSTEINE 10 % IN SOLN: 4.0000 mL | Freq: Three times a day (TID) | RESPIRATORY_TRACT | 0 refills | Status: AC

## 2022-05-27 NOTE — Telephone Encounter (Signed)
Di Kindle advised.

## 2022-05-27 NOTE — Addendum Note (Signed)
Addended by: Michela Pitcher on: 05/27/2022 04:32 PM   Modules accepted: Orders

## 2022-05-27 NOTE — Telephone Encounter (Signed)
Mucomyst inhalation sent to pharmacy

## 2022-05-27 NOTE — Telephone Encounter (Signed)
Home Health verbal orders Caller Name: [place] Name: [facility] number: [phone]  Requesting continuation of Speech therapy for swallowing.   Frequency: 1x a week every other week for 4 weeks.   Please forward to [facility] pool or providers CMA

## 2022-05-27 NOTE — Telephone Encounter (Signed)
Spoke with [employer]. Advised that Catalina Antigua prefers patient having CT scan to get a better picture of his lungs. I spoke with [name] and she is agreeable to proceeding with CT scan, she is aware that authorization has to be done on the scan and then they would be contacted to discuss scheduling.  [name] also said between Bactrim and Losartan it shows level 2 severity of interaction as an [place]

## 2022-05-27 NOTE — Telephone Encounter (Signed)
Verbal orders ok

## 2022-05-27 NOTE — Telephone Encounter (Signed)
[name] from [facility] called back in and stated she haven't found anything out. She is still waiting for the process on how to schedule with mobile x-ray. She was also wanting to know if you all knew anybody that does mobile x-rays. Thank you!

## 2022-05-27 NOTE — Telephone Encounter (Signed)
Austin Conner with [facility] called stating that there is medication called Mucomyst that gets put into nebulizer to help with extra secretion. Austin Conner will also check with [facility] supervisor to see if they can do mobile xray for the patient. [name] listened to patient's chest and it does have a lot of wetness in the chest and congestion, spitting up think mucus. No rattling but does sound congested a lot. She will call me back about mobile xray if they can do that for the patient.  [facility] pharmacy on file is correct for Mucomyst

## 2022-05-27 DEATH — deceased

## 2022-05-28 ENCOUNTER — Ambulatory Visit: Admission: RE | Admit: 2022-05-28 | Payer: Medicare Other | Source: Ambulatory Visit

## 2022-05-28 ENCOUNTER — Telehealth: Payer: Self-pay | Admitting: Nurse Practitioner

## 2022-05-28 NOTE — Telephone Encounter (Signed)
See other mychart message about this also.

## 2022-05-28 NOTE — Telephone Encounter (Signed)
I spoke with [name] (DPR signed) and [name] notified as instructed and [name] voiced understanding.[name] said pt is having a lot of problems breathing  but she also states he is not in distress at this time and is not to the point that he needs to go to ED. Pt is at room air pulse ox 92 - 94%. Pt is having a lot of thick secretions but [name] is suctioning pt;pt is not on oxygen at home. Pt is able to talk. [name] thinks that [facility] cancelled portable CXR with HH. I advised per note that [name] with [facility] was going to ck with [facility] supervisor to see if could do mobile xray and [name] also wants to know where we are at with getting pt the CT [facility] wanted. [name] wants our office to ck on status of CXR and CT. [name] said she does not feel like she should have to contact [facility] nurse to find out. [name] request cb after our office gets info. UC and ED precautions given and [name] voiced understanding. Sending note to Romilda Garret NP and Anastasiya CMA.

## 2022-05-28 NOTE — Addendum Note (Signed)
Addended by: Michela Pitcher on: 05/28/2022 07:45 AM   Modules accepted: Orders

## 2022-05-28 NOTE — Telephone Encounter (Signed)
[name] called to inform Charmian Muff that the pt didn't not attend his ct scan appt today, 05/28/22 due to transportation not picking the pt up. Pt has another appt scheduled for tomorrow, 05/29/22. Call back #[phone]

## 2022-05-28 NOTE — Telephone Encounter (Signed)
I did speak with [name] yesterday about not doing chest xray but doing a CT scan but she may not recall that. I will let her know this again but before I call, [name] did you want to change CT scan to STAT due to patient's symptoms and length of time it can take to get CT done or routine level?

## 2022-05-28 NOTE — Telephone Encounter (Signed)
noted 

## 2022-05-28 NOTE — Telephone Encounter (Signed)
Spoke with [facility]. Ok to change CT to STAT-working on this and will be in touch with [name] on this.

## 2022-05-28 NOTE — Telephone Encounter (Signed)
[name] called in and stated that patient is scheduled for CT scan this afternoon at 2:30 PM and the [place] will have to be done another day.

## 2022-05-28 NOTE — Telephone Encounter (Signed)
He also needs a [place] of his thyroid if we can get them both done at the same time  Noted about the level 2 interaction. No changed needed

## 2022-05-28 NOTE — Telephone Encounter (Signed)
Spoke with [employer]. He is not able to direct admit. If patient's oxygen level is low and he is in distress then he should go to ER. If he is [place] saw patient yesterday, then can try the Mucom[employer] and keep [place] updated. Sending to triage.

## 2022-05-28 NOTE — Telephone Encounter (Signed)
[name] would like to get CT scan done to get a better picture of patient's lungs vs Chest Xray and Home Health did not have any information on how to get mobile xray done. Patient also needs Ultrasound of Thyroid follow up done. Prior authorization is approved for CT scan and Ultrasound authorization is not needed. Since patient has to have transportation scheduled to take him for imaging [name] would need to call and coordinate this with the scheduling and EMS transportation. She would need to call [phone] for scheduling and also see if they can do both CT and [place] together at the same time. This would be done at [facility]. Please relay the information. Thank you

## 2022-05-28 NOTE — Addendum Note (Signed)
Addended by: Kris Mouton on: 05/28/2022 09:37 AM   Modules accepted: Orders

## 2022-05-28 NOTE — Telephone Encounter (Signed)
[name] notified as instructed and [name] voiced understanding. [name] said she would call [phone] for scheduling now; [name] will cb with appt info as FYI for [facility]. [name] was appreciative of call and the information. [name] said pt was doing OK right now. Sending note to [facility] CMA. Thank you.

## 2022-05-28 NOTE — Telephone Encounter (Signed)
Patient sister [name] called in and wanted to follow up on this. Thank you!

## 2022-05-29 ENCOUNTER — Other Ambulatory Visit: Payer: Self-pay | Admitting: Nurse Practitioner

## 2022-05-29 ENCOUNTER — Ambulatory Visit
Admission: RE | Admit: 2022-05-29 | Discharge: 2022-05-29 | Disposition: A | Discharge status: Home / Self Care | Payer: Medicare Other | Source: Ambulatory Visit | Attending: Nurse Practitioner | Admitting: Nurse Practitioner

## 2022-05-29 DIAGNOSIS — R918 Other nonspecific abnormal finding of lung field: Secondary | ICD-10-CM | POA: Diagnosis not present

## 2022-05-29 DIAGNOSIS — J189 Pneumonia, unspecified organism: Secondary | ICD-10-CM

## 2022-05-29 DIAGNOSIS — I7121 Aneurysm of the ascending aorta, without rupture: Secondary | ICD-10-CM | POA: Insufficient documentation

## 2022-05-29 DIAGNOSIS — Z8701 Personal history of pneumonia (recurrent): Secondary | ICD-10-CM | POA: Diagnosis present

## 2022-05-29 DIAGNOSIS — I517 Cardiomegaly: Secondary | ICD-10-CM | POA: Insufficient documentation

## 2022-05-29 MED ORDER — DOXYCYCLINE HYCLATE 100 MG PO TABS: 100.0000 mg | ORAL_TABLET | Freq: Two times a day (BID) | ORAL | 0 refills | Status: AC

## 2022-05-29 MED ORDER — AMOXICILLIN-POT CLAVULANATE 200-28.5 MG/5ML PO SUSR: 800.0000 mg | Freq: Two times a day (BID) | ORAL | 0 refills | Status: DC

## 2022-05-29 MED ORDER — AMOXICILLIN-POT CLAVULANATE 400-57 MG/5ML PO SUSR: 800.0000 mg | Freq: Two times a day (BID) | ORAL | 0 refills | Status: AC

## 2022-06-01 ENCOUNTER — Telehealth: Payer: Self-pay | Admitting: Nurse Practitioner

## 2022-06-01 ENCOUNTER — Encounter: Payer: Self-pay | Admitting: Nurse Practitioner

## 2022-06-01 NOTE — Telephone Encounter (Signed)
error 

## 2022-06-01 NOTE — Telephone Encounter (Signed)
Spoke with PCP and looked at everyone's schedule and there are no openings for today.

## 2022-06-01 NOTE — Telephone Encounter (Signed)
Patient sister [name] called in and stated that emergency transport will be at the house to pick up Austin Conner. She stated is there anyway to squeeze him in today. She is off today and can't take anymore days off. She would for someone to give her a call if this is ok. Thank you!

## 2022-06-02 ENCOUNTER — Emergency Department: Payer: Medicare Other

## 2022-06-02 ENCOUNTER — Inpatient Hospital Stay
Admission: EM | Admit: 2022-06-02 | Discharge: 2022-06-26 | DRG: 177 | Disposition: E | Payer: Medicare Other | Attending: Student in an Organized Health Care Education/Training Program | Admitting: Student in an Organized Health Care Education/Training Program

## 2022-06-02 DIAGNOSIS — Z881 Allergy status to other antibiotic agents status: Secondary | ICD-10-CM

## 2022-06-02 DIAGNOSIS — N400 Enlarged prostate without lower urinary tract symptoms: Secondary | ICD-10-CM | POA: Diagnosis present

## 2022-06-02 DIAGNOSIS — R1310 Dysphagia, unspecified: Secondary | ICD-10-CM | POA: Diagnosis present

## 2022-06-02 DIAGNOSIS — J690 Pneumonitis due to inhalation of food and vomit: Secondary | ICD-10-CM | POA: Diagnosis not present

## 2022-06-02 DIAGNOSIS — J810 Acute pulmonary edema: Secondary | ICD-10-CM | POA: Diagnosis present

## 2022-06-02 DIAGNOSIS — K219 Gastro-esophageal reflux disease without esophagitis: Secondary | ICD-10-CM | POA: Diagnosis present

## 2022-06-02 DIAGNOSIS — Z820 Family history of epilepsy and other diseases of the nervous system: Secondary | ICD-10-CM

## 2022-06-02 DIAGNOSIS — I10 Essential (primary) hypertension: Secondary | ICD-10-CM | POA: Diagnosis present

## 2022-06-02 DIAGNOSIS — Z8249 Family history of ischemic heart disease and other diseases of the circulatory system: Secondary | ICD-10-CM

## 2022-06-02 DIAGNOSIS — R0602 Shortness of breath: Secondary | ICD-10-CM | POA: Diagnosis not present

## 2022-06-02 DIAGNOSIS — J9601 Acute respiratory failure with hypoxia: Secondary | ICD-10-CM | POA: Diagnosis present

## 2022-06-02 DIAGNOSIS — Z931 Gastrostomy status: Secondary | ICD-10-CM

## 2022-06-02 DIAGNOSIS — J189 Pneumonia, unspecified organism: Secondary | ICD-10-CM

## 2022-06-02 DIAGNOSIS — Z1152 Encounter for screening for COVID-19: Secondary | ICD-10-CM

## 2022-06-02 DIAGNOSIS — G2581 Restless legs syndrome: Secondary | ICD-10-CM | POA: Diagnosis present

## 2022-06-02 DIAGNOSIS — Z79899 Other long term (current) drug therapy: Secondary | ICD-10-CM

## 2022-06-02 DIAGNOSIS — I469 Cardiac arrest, cause unspecified: Secondary | ICD-10-CM | POA: Diagnosis not present

## 2022-06-02 DIAGNOSIS — Z87820 Personal history of traumatic brain injury: Secondary | ICD-10-CM

## 2022-06-02 DIAGNOSIS — G8250 Quadriplegia, unspecified: Secondary | ICD-10-CM

## 2022-06-02 DIAGNOSIS — Z8701 Personal history of pneumonia (recurrent): Secondary | ICD-10-CM
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
## 2022-06-03 ENCOUNTER — Other Ambulatory Visit: Payer: Self-pay

## 2022-06-03 ENCOUNTER — Encounter: Payer: Self-pay | Admitting: Family Medicine

## 2022-06-03 DIAGNOSIS — G8250 Quadriplegia, unspecified: Secondary | ICD-10-CM | POA: Diagnosis present

## 2022-06-03 DIAGNOSIS — J810 Acute pulmonary edema: Secondary | ICD-10-CM | POA: Insufficient documentation

## 2022-06-03 DIAGNOSIS — J189 Pneumonia, unspecified organism: Secondary | ICD-10-CM | POA: Diagnosis not present

## 2022-06-03 DIAGNOSIS — I10 Essential (primary) hypertension: Secondary | ICD-10-CM

## 2022-06-03 DIAGNOSIS — J9601 Acute respiratory failure with hypoxia: Secondary | ICD-10-CM

## 2022-06-03 DIAGNOSIS — Z79899 Other long term (current) drug therapy: Secondary | ICD-10-CM | POA: Diagnosis not present

## 2022-06-03 DIAGNOSIS — I469 Cardiac arrest, cause unspecified: Secondary | ICD-10-CM | POA: Diagnosis not present

## 2022-06-03 DIAGNOSIS — Z87820 Personal history of traumatic brain injury: Secondary | ICD-10-CM | POA: Diagnosis not present

## 2022-06-03 DIAGNOSIS — G2581 Restless legs syndrome: Secondary | ICD-10-CM | POA: Diagnosis present

## 2022-06-03 DIAGNOSIS — Z931 Gastrostomy status: Secondary | ICD-10-CM | POA: Diagnosis not present

## 2022-06-03 DIAGNOSIS — Z1152 Encounter for screening for COVID-19: Secondary | ICD-10-CM | POA: Diagnosis not present

## 2022-06-03 DIAGNOSIS — Z820 Family history of epilepsy and other diseases of the nervous system: Secondary | ICD-10-CM | POA: Diagnosis not present

## 2022-06-03 DIAGNOSIS — Z8701 Personal history of pneumonia (recurrent): Secondary | ICD-10-CM | POA: Diagnosis not present

## 2022-06-03 DIAGNOSIS — Z881 Allergy status to other antibiotic agents status: Secondary | ICD-10-CM | POA: Diagnosis not present

## 2022-06-03 DIAGNOSIS — R0602 Shortness of breath: Secondary | ICD-10-CM | POA: Diagnosis present

## 2022-06-03 DIAGNOSIS — N400 Enlarged prostate without lower urinary tract symptoms: Secondary | ICD-10-CM | POA: Diagnosis present

## 2022-06-03 DIAGNOSIS — K219 Gastro-esophageal reflux disease without esophagitis: Secondary | ICD-10-CM | POA: Diagnosis present

## 2022-06-03 DIAGNOSIS — R1310 Dysphagia, unspecified: Secondary | ICD-10-CM | POA: Diagnosis present

## 2022-06-03 DIAGNOSIS — Z8249 Family history of ischemic heart disease and other diseases of the circulatory system: Secondary | ICD-10-CM | POA: Diagnosis not present

## 2022-06-03 DIAGNOSIS — J690 Pneumonitis due to inhalation of food and vomit: Secondary | ICD-10-CM | POA: Diagnosis present

## 2022-06-03 LAB — BASIC METABOLIC PANEL
Anion gap: 7 (ref 5–15)
BUN: 20 mg/dL (ref 8–23)
CO2: 35 mmol/L — ABNORMAL HIGH (ref 22–32)
Calcium: 8.9 mg/dL (ref 8.9–10.3)
Chloride: 100 mmol/L (ref 98–111)
Creatinine, Ser: 0.36 mg/dL — ABNORMAL LOW (ref 0.61–1.24)
GFR, Estimated: >60 mL/min (ref >60)
Glucose, Bld: 103 mg/dL — ABNORMAL HIGH (ref 70–99)
Potassium: 4 mmol/L (ref 3.5–5.1)
Sodium: 142 mmol/L (ref 135–145)

## 2022-06-03 LAB — COMPREHENSIVE METABOLIC PANEL
ALT: 13 U/L (ref 0–44)
AST: 19 U/L (ref 15–41)
Albumin: 3.5 g/dL (ref 3.5–5.0)
Alkaline Phosphatase: 57 U/L (ref 38–126)
Anion gap: 5 (ref 5–15)
BUN: 21 mg/dL (ref 8–23)
CO2: 35 mmol/L — ABNORMAL HIGH (ref 22–32)
Calcium: 9 mg/dL (ref 8.9–10.3)
Chloride: 98 mmol/L (ref 98–111)
Creatinine, Ser: <0.3 mg/dL — ABNORMAL LOW (ref 0.61–1.24)
Glucose, Bld: 104 mg/dL — ABNORMAL HIGH (ref 70–99)
Potassium: 4.1 mmol/L (ref 3.5–5.1)
Sodium: 138 mmol/L (ref 135–145)
Total Bilirubin: 0.6 mg/dL (ref 0.3–1.2)
Total Protein: 7.1 g/dL (ref 6.5–8.1)

## 2022-06-03 LAB — RESP PANEL BY RT-PCR (FLU A&B, COVID) ARPGX2
Influenza A by PCR: NEGATIVE
Influenza B by PCR: NEGATIVE
SARS Coronavirus 2 by RT PCR: NEGATIVE

## 2022-06-03 LAB — CBC
HCT: 38.9 % — ABNORMAL LOW (ref 39.0–52.0)
Hemoglobin: 12.4 g/dL — ABNORMAL LOW (ref 13.0–17.0)
MCH: 28.8 pg (ref 26.0–34.0)
MCHC: 31.9 g/dL (ref 30.0–36.0)
MCV: 90.5 fL (ref 80.0–100.0)
Platelets: 239 10*3/uL (ref 150–400)
RBC: 4.3 MIL/uL (ref 4.22–5.81)
RDW: 15.7 % — ABNORMAL HIGH (ref 11.5–15.5)
WBC: 8.4 10*3/uL (ref 4.0–10.5)
nRBC: 0 % (ref 0.0–0.2)

## 2022-06-03 LAB — CBC WITH DIFFERENTIAL/PLATELET
Abs Immature Granulocytes: 0.02 10*3/uL (ref 0.00–0.07)
Basophils Absolute: 0 10*3/uL (ref 0.0–0.1)
Basophils Relative: 0 %
Eosinophils Absolute: 0.2 10*3/uL (ref 0.0–0.5)
Eosinophils Relative: 2 %
HCT: 38.6 % — ABNORMAL LOW (ref 39.0–52.0)
Hemoglobin: 12.3 g/dL — ABNORMAL LOW (ref 13.0–17.0)
Immature Granulocytes: 0 %
Lymphocytes Relative: 12 %
Lymphs Abs: 1.1 10*3/uL (ref 0.7–4.0)
MCH: 28.6 pg (ref 26.0–34.0)
MCHC: 31.9 g/dL (ref 30.0–36.0)
MCV: 89.8 fL (ref 80.0–100.0)
Monocytes Absolute: 1 10*3/uL (ref 0.1–1.0)
Monocytes Relative: 12 %
Neutro Abs: 6.4 10*3/uL (ref 1.7–7.7)
Neutrophils Relative %: 74 %
Platelets: 231 10*3/uL (ref 150–400)
RBC: 4.3 MIL/uL (ref 4.22–5.81)
RDW: 15.7 % — ABNORMAL HIGH (ref 11.5–15.5)
WBC: 8.6 10*3/uL (ref 4.0–10.5)
nRBC: 0 % (ref 0.0–0.2)

## 2022-06-03 LAB — D-DIMER, QUANTITATIVE: D-Dimer, Quant: 0.59 ug/mL-FEU — ABNORMAL HIGH (ref 0.00–0.50)

## 2022-06-03 LAB — TROPONIN I (HIGH SENSITIVITY)
Troponin I (High Sensitivity): 11 ng/L (ref <18)
Troponin I (High Sensitivity): 8 ng/L (ref <18)

## 2022-06-03 LAB — BRAIN NATRIURETIC PEPTIDE: B Natriuretic Peptide: 61.9 pg/mL (ref 0.0–100.0)

## 2022-06-03 MED ORDER — GUAIFENESIN 100 MG/5ML PO LIQD: 400.0000 mg | Freq: Two times a day (BID) | ORAL | Status: DC

## 2022-06-03 MED ORDER — CYANOCOBALAMIN-METHYLCOBALAMIN 10000 (B12) MCG/2ML SL LIQD: 1000.0000 ug | Freq: Every day | SUBLINGUAL | Status: DC

## 2022-06-03 MED ORDER — METOPROLOL TARTRATE 50 MG PO TABS
50.0000 mg | ORAL_TABLET | Freq: Two times a day (BID) | ORAL | Status: DC
  Administered 2022-06-03 – 2022-06-04 (×3): 50 mg
  Filled 2022-06-03 (×4): qty 1

## 2022-06-03 MED ORDER — HYDROCOD POLI-CHLORPHE POLI ER 10-8 MG/5ML PO SUER: 5.0000 mL | Freq: Two times a day (BID) | ORAL | Status: DC | PRN

## 2022-06-03 MED ORDER — FUROSEMIDE 10 MG/ML IJ SOLN
20.0000 mg | Freq: Two times a day (BID) | INTRAMUSCULAR | Status: DC
  Administered 2022-06-03 – 2022-06-04 (×3): 20 mg via INTRAVENOUS
  Filled 2022-06-03 (×2): qty 2
  Filled 2022-06-03: qty 4

## 2022-06-03 MED ORDER — SODIUM CHLORIDE 0.9 % IV SOLN
2.0000 g | INTRAVENOUS | Status: DC
  Administered 2022-06-03 – 2022-06-04 (×2): 2 g via INTRAVENOUS
  Filled 2022-06-03 (×3): qty 20

## 2022-06-03 MED ORDER — GUAIFENESIN 100 MG/5ML PO LIQD: 5.0000 mL | Freq: Four times a day (QID) | ORAL | Status: DC | PRN

## 2022-06-03 MED ORDER — ALBUTEROL SULFATE (2.5 MG/3ML) 0.083% IN NEBU
2.5000 mg | INHALATION_SOLUTION | RESPIRATORY_TRACT | Status: DC | PRN
  Administered 2022-06-03 (×2): 2.5 mg via RESPIRATORY_TRACT
  Filled 2022-06-03 (×2): qty 3

## 2022-06-03 MED ORDER — ADULT MULTIVITAMIN W/MINERALS CH
1.0000 | ORAL_TABLET | Freq: Every day | ORAL | Status: DC
  Administered 2022-06-04: 1
  Filled 2022-06-03: qty 1

## 2022-06-03 MED ORDER — FAMOTIDINE 20 MG PO TABS
20.0000 mg | ORAL_TABLET | Freq: Two times a day (BID) | ORAL | Status: DC
  Administered 2022-06-03 – 2022-06-04 (×4): 20 mg
  Filled 2022-06-03 (×5): qty 1

## 2022-06-03 MED ORDER — KATE FARMS STANDARD 1.4 PO LIQD
406.0000 mL | Freq: Four times a day (QID) | ORAL | Status: DC
  Administered 2022-06-03 – 2022-06-04 (×4): 406 mL
  Filled 2022-06-03: qty 650

## 2022-06-03 MED ORDER — ONDANSETRON HCL 4 MG/2ML IJ SOLN: 4.0000 mg | Freq: Four times a day (QID) | INTRAMUSCULAR | Status: DC | PRN

## 2022-06-03 MED ORDER — SODIUM CHLORIDE 0.9 % IV SOLN
500.0000 mg | Freq: Once | INTRAVENOUS | Status: AC
  Administered 2022-06-03: 500 mg via INTRAVENOUS
  Filled 2022-06-03 (×2): qty 5

## 2022-06-03 MED ORDER — FREE WATER
210.0000 mL | Freq: Four times a day (QID) | Status: DC
  Administered 2022-06-03 – 2022-06-04 (×5): 210 mL
  Filled 2022-06-03 (×3): qty 210

## 2022-06-03 MED ORDER — ONDANSETRON HCL 4 MG PO TABS: 4.0000 mg | ORAL_TABLET | Freq: Four times a day (QID) | ORAL | Status: DC | PRN

## 2022-06-03 MED ORDER — GUAIFENESIN ER 600 MG PO TB12
600.0000 mg | ORAL_TABLET | Freq: Two times a day (BID) | ORAL | Status: DC
  Filled 2022-06-03: qty 1

## 2022-06-03 MED ORDER — MAGNESIUM HYDROXIDE 400 MG/5ML PO SUSP: 30.0000 mL | Freq: Every day | ORAL | Status: DC | PRN

## 2022-06-03 MED ORDER — ACETAMINOPHEN 650 MG RE SUPP: 650.0000 mg | Freq: Four times a day (QID) | RECTAL | Status: DC | PRN

## 2022-06-03 MED ORDER — TRAZODONE HCL 50 MG PO TABS: 25.0000 mg | ORAL_TABLET | Freq: Every evening | ORAL | Status: DC | PRN

## 2022-06-03 MED ORDER — IPRATROPIUM-ALBUTEROL 0.5-2.5 (3) MG/3ML IN SOLN
3.0000 mL | Freq: Four times a day (QID) | RESPIRATORY_TRACT | Status: DC
  Administered 2022-06-03 – 2022-06-04 (×4): 3 mL via RESPIRATORY_TRACT
  Filled 2022-06-03 (×3): qty 3

## 2022-06-03 MED ORDER — PANTOPRAZOLE SODIUM 40 MG PO PACK: 20.0000 mg | PACK | Freq: Two times a day (BID) | ORAL | Status: DC

## 2022-06-03 MED ORDER — PROSOURCE TF PO LIQD
45.0000 mL | Freq: Two times a day (BID) | ORAL | Status: DC
  Administered 2022-06-03 – 2022-06-04 (×3): 45 mL
  Filled 2022-06-03 (×5): qty 45

## 2022-06-03 MED ORDER — ACETYLCYSTEINE 20 % IN SOLN
4.0000 mL | Freq: Three times a day (TID) | RESPIRATORY_TRACT | Status: DC
  Administered 2022-06-03 – 2022-06-04 (×5): 4 mL via RESPIRATORY_TRACT
  Filled 2022-06-03 (×6): qty 4

## 2022-06-03 MED ORDER — SODIUM CHLORIDE 0.9 % IV SOLN
500.0000 mg | INTRAVENOUS | Status: DC
  Administered 2022-06-04: 500 mg via INTRAVENOUS
  Filled 2022-06-03 (×2): qty 5

## 2022-06-03 MED ORDER — SODIUM CHLORIDE 0.9 % IV SOLN
1.0000 g | Freq: Once | INTRAVENOUS | Status: AC
  Administered 2022-06-03: 1 g via INTRAVENOUS
  Filled 2022-06-03: qty 10

## 2022-06-03 MED ORDER — ENOXAPARIN SODIUM 60 MG/0.6ML IJ SOSY
0.5000 mg/kg | PREFILLED_SYRINGE | INTRAMUSCULAR | Status: DC
  Administered 2022-06-03 – 2022-06-04 (×2): 57.5 mg via SUBCUTANEOUS
  Filled 2022-06-03 (×2): qty 0.6

## 2022-06-03 MED ORDER — CYANOCOBALAMIN 1000 MCG/ML IJ SOLN
1000.0000 ug | Freq: Every day | INTRAMUSCULAR | Status: DC
  Administered 2022-06-03 – 2022-06-04 (×2): 1000 ug via INTRAMUSCULAR
  Filled 2022-06-03 (×3): qty 1

## 2022-06-03 MED ORDER — ACETAMINOPHEN 325 MG PO TABS
650.0000 mg | ORAL_TABLET | Freq: Four times a day (QID) | ORAL | Status: DC | PRN
  Administered 2022-06-04: 650 mg via ORAL
  Filled 2022-06-03: qty 2

## 2022-06-03 MED ORDER — FUROSEMIDE 10 MG/ML IJ SOLN
40.0000 mg | Freq: Once | INTRAMUSCULAR | Status: AC
  Administered 2022-06-03: 40 mg via INTRAVENOUS
  Filled 2022-06-03: qty 4

## 2022-06-03 MED ORDER — BIOTENE DRY MOUTH MT LIQD: 15.0000 mL | OROMUCOSAL | Status: DC | PRN

## 2022-06-03 MED ORDER — LOSARTAN POTASSIUM 50 MG PO TABS
100.0000 mg | ORAL_TABLET | Freq: Every day | ORAL | Status: DC
  Administered 2022-06-04: 100 mg via ORAL
  Filled 2022-06-03 (×2): qty 2

## 2022-06-03 MED ORDER — DICLOFENAC SODIUM 1 % EX GEL
2.0000 g | Freq: Four times a day (QID) | CUTANEOUS | Status: DC
  Filled 2022-06-03 (×2): qty 100

## 2022-06-03 MED ORDER — GUAIFENESIN 100 MG/5ML PO LIQD
5.0000 mL | ORAL | Status: DC | PRN
  Administered 2022-06-03: 5 mL
  Filled 2022-06-03: qty 10
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
## 2022-06-03 NOTE — Assessment & Plan Note (Addendum)
-   The patient will be placed on IV Rocephin and Zithromax. - Bronchodilator therapy as well as mucolytic therapy will be provided. - We will follow blood cultures. - O2 protocol will be followed. - The patient has been followed by Dr. Lanney Gins on outpatient basis. - We will obtain a pulmonary consult.  I notified him.

## 2022-06-03 NOTE — Assessment & Plan Note (Addendum)
-   This is likely secondary to community-acquired pneumonia as well as mild acute diastolic CHF though BNP is normal.  The later could be cor pulmonale. - The patient be admitted to a medical telemetry bed. - O2 protocol will be followed. - Management otherwise as below.

## 2022-06-03 NOTE — ED Notes (Signed)
Attempted IV access x 1- unsuccessful at this time, family member upset with patient being stuck multiple times, IV consult already in place.

## 2022-06-03 NOTE — ED Notes (Signed)
This RN went to speak to pt's sister due to her becoming upset that pt was still in the ER and had not been moved upstairs to IP unit. This RN offered to call and see where he was in the que and that if he needed to stay down here longer than I would get him a hospital bed for comfort. [name] given pt advocate phone number and asked to express her concerns with them. When this RN was leaving the room this RN was called by Network engineer and stated pt had a bed, family updated and were thankful. Primary RN updated.

## 2022-06-03 NOTE — Assessment & Plan Note (Signed)
-   We will continue antihypertensives. 

## 2022-06-03 NOTE — Assessment & Plan Note (Signed)
-   We will continue PPI therapy 
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
## 2022-06-04 DIAGNOSIS — I10 Essential (primary) hypertension: Secondary | ICD-10-CM | POA: Diagnosis not present

## 2022-06-04 DIAGNOSIS — J9601 Acute respiratory failure with hypoxia: Secondary | ICD-10-CM | POA: Diagnosis not present

## 2022-06-04 DIAGNOSIS — J810 Acute pulmonary edema: Secondary | ICD-10-CM

## 2022-06-04 DIAGNOSIS — G8250 Quadriplegia, unspecified: Secondary | ICD-10-CM

## 2022-06-04 LAB — CBC
HCT: 42 % (ref 39.0–52.0)
Hemoglobin: 13.1 g/dL (ref 13.0–17.0)
MCH: 28.9 pg (ref 26.0–34.0)
MCHC: 31.2 g/dL (ref 30.0–36.0)
MCV: 92.7 fL (ref 80.0–100.0)
Platelets: 269 10*3/uL (ref 150–400)
RBC: 4.53 MIL/uL (ref 4.22–5.81)
RDW: 15.6 % — ABNORMAL HIGH (ref 11.5–15.5)
WBC: 14.1 10*3/uL — ABNORMAL HIGH (ref 4.0–10.5)
nRBC: 0 % (ref 0.0–0.2)

## 2022-06-04 LAB — C-REACTIVE PROTEIN: CRP: 2.6 mg/dL — ABNORMAL HIGH (ref <1.0)

## 2022-06-04 LAB — COMPREHENSIVE METABOLIC PANEL
ALT: 13 U/L (ref 0–44)
AST: 19 U/L (ref 15–41)
Albumin: 3.6 g/dL (ref 3.5–5.0)
Alkaline Phosphatase: 60 U/L (ref 38–126)
Anion gap: 7 (ref 5–15)
BUN: 26 mg/dL — ABNORMAL HIGH (ref 8–23)
CO2: 40 mmol/L — ABNORMAL HIGH (ref 22–32)
Calcium: 9.4 mg/dL (ref 8.9–10.3)
Chloride: 96 mmol/L — ABNORMAL LOW (ref 98–111)
Creatinine, Ser: <0.3 mg/dL — ABNORMAL LOW (ref 0.61–1.24)
Glucose, Bld: 159 mg/dL — ABNORMAL HIGH (ref 70–99)
Potassium: 3.9 mmol/L (ref 3.5–5.1)
Sodium: 143 mmol/L (ref 135–145)
Total Bilirubin: 0.6 mg/dL (ref 0.3–1.2)
Total Protein: 7.9 g/dL (ref 6.5–8.1)

## 2022-06-04 LAB — LACTIC ACID, PLASMA
Lactic Acid, Venous: 0.6 mmol/L (ref 0.5–1.9)
Lactic Acid, Venous: 0.6 mmol/L (ref 0.5–1.9)

## 2022-06-04 MED ORDER — TORSEMIDE 20 MG PO TABS
20.0000 mg | ORAL_TABLET | Freq: Every day | ORAL | Status: DC
  Administered 2022-06-04: 20 mg
  Filled 2022-06-04: qty 1

## 2022-06-04 MED ORDER — TIZANIDINE HCL 2 MG PO TABS
6.0000 mg | ORAL_TABLET | Freq: Three times a day (TID) | ORAL | Status: DC
  Administered 2022-06-04 (×2): 6 mg via ORAL
  Filled 2022-06-04 (×4): qty 3

## 2022-06-04 MED ORDER — IPRATROPIUM-ALBUTEROL 0.5-2.5 (3) MG/3ML IN SOLN
3.0000 mL | Freq: Three times a day (TID) | RESPIRATORY_TRACT | Status: DC
  Administered 2022-06-04 (×2): 3 mL via RESPIRATORY_TRACT
  Filled 2022-06-04 (×2): qty 3

## 2022-06-04 MED ORDER — IPRATROPIUM-ALBUTEROL 0.5-2.5 (3) MG/3ML IN SOLN: 3.0000 mL | Freq: Four times a day (QID) | RESPIRATORY_TRACT | Status: DC | PRN

## 2022-06-04 MED ORDER — AMOXICILLIN-POT CLAVULANATE 875-125 MG PO TABS
1.0000 | ORAL_TABLET | Freq: Two times a day (BID) | ORAL | Status: DC
  Administered 2022-06-04: 1
  Filled 2022-06-04: qty 1

## 2022-06-04 MED ORDER — GUAIFENESIN 100 MG/5ML PO LIQD: 10.0000 mL | Freq: Four times a day (QID) | ORAL | Status: DC | PRN

## 2022-06-04 MED ORDER — DANTROLENE SODIUM 100 MG PO CAPS
100.0000 mg | ORAL_CAPSULE | Freq: Two times a day (BID) | ORAL | Status: DC
  Administered 2022-06-04 (×2): 100 mg
  Filled 2022-06-04 (×4): qty 1

## 2022-06-04 MED ORDER — TORSEMIDE 20 MG PO TABS: 10.0000 mg | ORAL_TABLET | Freq: Two times a day (BID) | ORAL | Status: DC

## 2022-06-09 LAB — CULTURE, BLOOD (ROUTINE X 2)
Culture: NO GROWTH
Culture: NO GROWTH
Special Requests: ADEQUATE
Special Requests: ADEQUATE

## 2022-06-22 ENCOUNTER — Ambulatory Visit: Payer: Medicare Other | Admitting: Nurse Practitioner

## 2022-06-24 ENCOUNTER — Ambulatory Visit: Payer: Medicare Other | Admitting: Neurology

## 2022-06-24 ENCOUNTER — Ambulatory Visit: Payer: Medicare Other | Admitting: Nurse Practitioner

## 2022-06-26 NOTE — Progress Notes (Signed)
Sister, primary caregiver, concerned about patient not being on home medications. MD notified and concerns addressed.

## 2022-06-26 NOTE — Progress Notes (Signed)
       CROSS COVER NOTE  NAME: Austin Conner MRN: [id number] DOB : 01/17/1956    Time of Service   04-Nov-2329  HPI/Events of Note   Patient went into asystole arrest. ACLS initiated but as requested to stop by POA  (sister) Fredric Mare at bedside.  Timeo f death 11-04-2336       Kathlene Cote NP [facility]
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
## 2022-06-26 NOTE — Progress Notes (Signed)
2328/10/24- This RN noted cardiac rhythm of asystole on telemetry monitor at nurse's station. This RN into patient's room. Telemetry monitor on patient and working correctly. No pulse upon palpation. CPR initiated and code blue called. Pt sister [name], [place], also at bedside. POA requested CPR be discontinued. Time of death called at October 24, 2336 by B. Randol Kern, NP.
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
## 2022-06-26 NOTE — Progress Notes (Signed)
Patient with decreased communication than at baseline, also febrile and treated. Continues with excessive secretions and requiring suctioning. MD notified , new orders as noted.

## 2022-06-26 NOTE — Progress Notes (Incomplete)
PROGRESS NOTE  Austin Conner    DOB: 1956-02-08, 66 y.o.  HYW:[id number]    Code Status: Full Code   DOA: 05/31/2022   LOS: 0   Brief hospital course  Austin Conner is a 66 y.o. male with a PMH significant for TBI and quadriplegia, RLS, hypertension, GERD and dysphagia s/p PEG tube placement.  They presented from home where they live with their sister to the ED on 06/16/2022 with dyspnea x several days. Also had associated productive cough and worsening generalized edema.He was treated outpatient recently for UTI with bactrim and for CAP with augmentin and doxycycline x4 days without improvement.   In the ED, it was found that they had new oxygen requirement of of 2L to maintain O2 saturation >92%. RR was 28 on presentation. Labs revealed a CO2 of 35 with otherwise unremarkable CMP.  CBC showed mild anemia.  Influenza antigens and COVID-19 PCR came back negative.   Significant findings included: EKG showed normal sinus rhythm with a rate of 78 with PACs, biatrial enlargement, left axis deviation and poor R wave progression. Imaging: Portable chest ray showed mild cardiomegaly with vascular congestion.  They were initially treated with IV cefepime and zithromax.   Patient was admitted to medicine service for further workup and management of acute hypoxic respiratory failure as outlined in detail below. Pulmonology was consulted on admission, per family request.  06/03/22 -stable  Assessment & Plan  Principal Problem:   Acute hypoxic respiratory failure ([place]) Active Problems:   CAP (community acquired pneumonia)   Essential hypertension   GERD without esophagitis   Acute hypoxic respiratory failure  CAP  pulmonary vascular edema- stable on 2L [place]. Decreased breath sounds at bases. Caregiver at bedside endorses that he is sedated this morning from prior administration of robitussin. Echo from 09/2021 shows E 60-65%, on home diuretics. - wean oxygen to room air as tolerated - continue IV  Rocephin and Zithromax. - continue IV diuresis - Bronchodilator therapy as well as mucolytic therapy will be provided. - follow sputum cultures. - wean to room air as tolerated - pulmonology consulted, appreciate recs   Essential hypertension -  continue home antihypertensives.  Chronic spastic quadriplegia  - continue home meds - PT/OT   GERD without esophagitis - continue home PPI therapy  Body mass index is 30.94 kg/m.  VTE ppx: lovenox  Diet:     Diet   Diet Heart Room service appropriate? Yes; Fluid consistency: Thin   Consultants: Pulmonology  Subjective 06/03/22    Pt reports no complaints at this time. His caregiver (sister) at bedside had several concerns and questions which were addressed at time of encounter. She expresses strong interest in seeing pulmonology.    Objective   Vitals:   05/28/2022 2352 06/03/22 0030 06/03/22 0200 06/03/22 0330  BP:  125/74 127/80 126/82  Pulse:  71 71 75  Resp:  (!) 27 (!) 21 20  Temp:    98.1 F (36.7 C)  TempSrc:    Oral  SpO2:  98% 97% 96%  Weight: 115.3 kg     Height: '6\' 4"'$  (1.93 m)       Intake/Output Summary (Last 24 hours) at 06/03/2022 0816 Last data filed at 06/03/2022 0703 Gross per 24 hour  Intake --  Output 1825 ml  Net -1825 ml   Filed Weights   06/06/2022 2352  Weight: 115.3 kg     Physical Exam:  General: awake, alert, NAD HEENT: atraumatic, clear conjunctiva, anicteric sclera, MMM,  hearing grossly normal Respiratory: normal respiratory effort. No wheezing. Decreased lung sounds bilateral bases, left worse than right Cardiovascular: quick capillary refill, normal S1/S2, RRR, no JVD, murmurs Nervous: alert, not following commands. Aware of my presence.  Extremities: generalized non-pitting edema Skin: dry, intact, normal temperature, normal color. No rashes, lesions or ulcers on exposed skin  Labs   I have personally reviewed the following labs and imaging studies CBC    Component Value  Date/Time   WBC 8.4 06/03/2022 0518   RBC 4.30 06/03/2022 0518   HGB 12.4 (L) 06/03/2022 0518   HCT 38.9 (L) 06/03/2022 0518   PLT 239 06/03/2022 0518   MCV 90.5 06/03/2022 0518   MCH 28.8 06/03/2022 0518   MCHC 31.9 06/03/2022 0518   RDW 15.7 (H) 06/03/2022 0518   LYMPHSABS 1.1 05/30/2022 2333   MONOABS 1.0 06/08/2022 2333   EOSABS 0.2 06/01/2022 2333   BASOSABS 0.0 06/11/2022 2333      Latest Ref Rng & Units 06/03/2022    5:18 AM 06/03/2022    3:36 AM 03/11/2022    5:56 AM  BMP  Glucose 70 - 99 mg/dL 103  104  108   BUN 8 - 23 mg/dL '20  21  16   '$ Creatinine 0.61 - 1.24 mg/dL 0.36  <0.30  <0.30   Sodium 135 - 145 mmol/L 142  138  139   Potassium 3.5 - 5.1 mmol/L 4.0  4.1  3.5   Chloride 98 - 111 mmol/L 100  98  102   CO2 22 - 32 mmol/L 35  35  32   Calcium 8.9 - 10.3 mg/dL 8.9  9.0  8.4     DG Chest Portable 1 View  Result Date: 06/03/2022 CLINICAL DATA:  Shortness of breath. EXAM: PORTABLE CHEST 1 VIEW COMPARISON:  Chest radiograph dated 03/10/2022 and CT dated 05/29/2022. FINDINGS: There is mild cardiomegaly with vascular congestion. Bibasilar atelectasis. Developing infiltrate is not excluded. No pneumothorax. No acute osseous pathology. IMPRESSION: Mild cardiomegaly with vascular congestion. Electronically Signed   By: Anner Crete M.D.   On: 06/03/2022 00:26    Disposition Plan & Communication  Patient status: Inpatient  Admitted From: Home Planned disposition location: Home Anticipated discharge date: 11/10 pending able to wean from oxygen  Family Communication: sister at bedside    Author: Richarda Osmond, DO [facility] 06/03/2022, 8:16 AM   Available by Epic secure chat 7AM-7PM. If 7PM-7AM, please contact night-coverage.  [facility] contact information found on [email or web address].

## 2022-06-26 DEATH — deceased

## 2023-11-05 IMAGING — CR DG CHEST 2V
1 series · 2 of 2 positions shown · non-contrast
Comparison: None

CLINICAL DATA: Cough, weakness

EXAM:
CHEST - 2 VIEW

[Series 1: left lateral · 0.14mm/px · 2 of 2 slices shown]
[im 1/2]
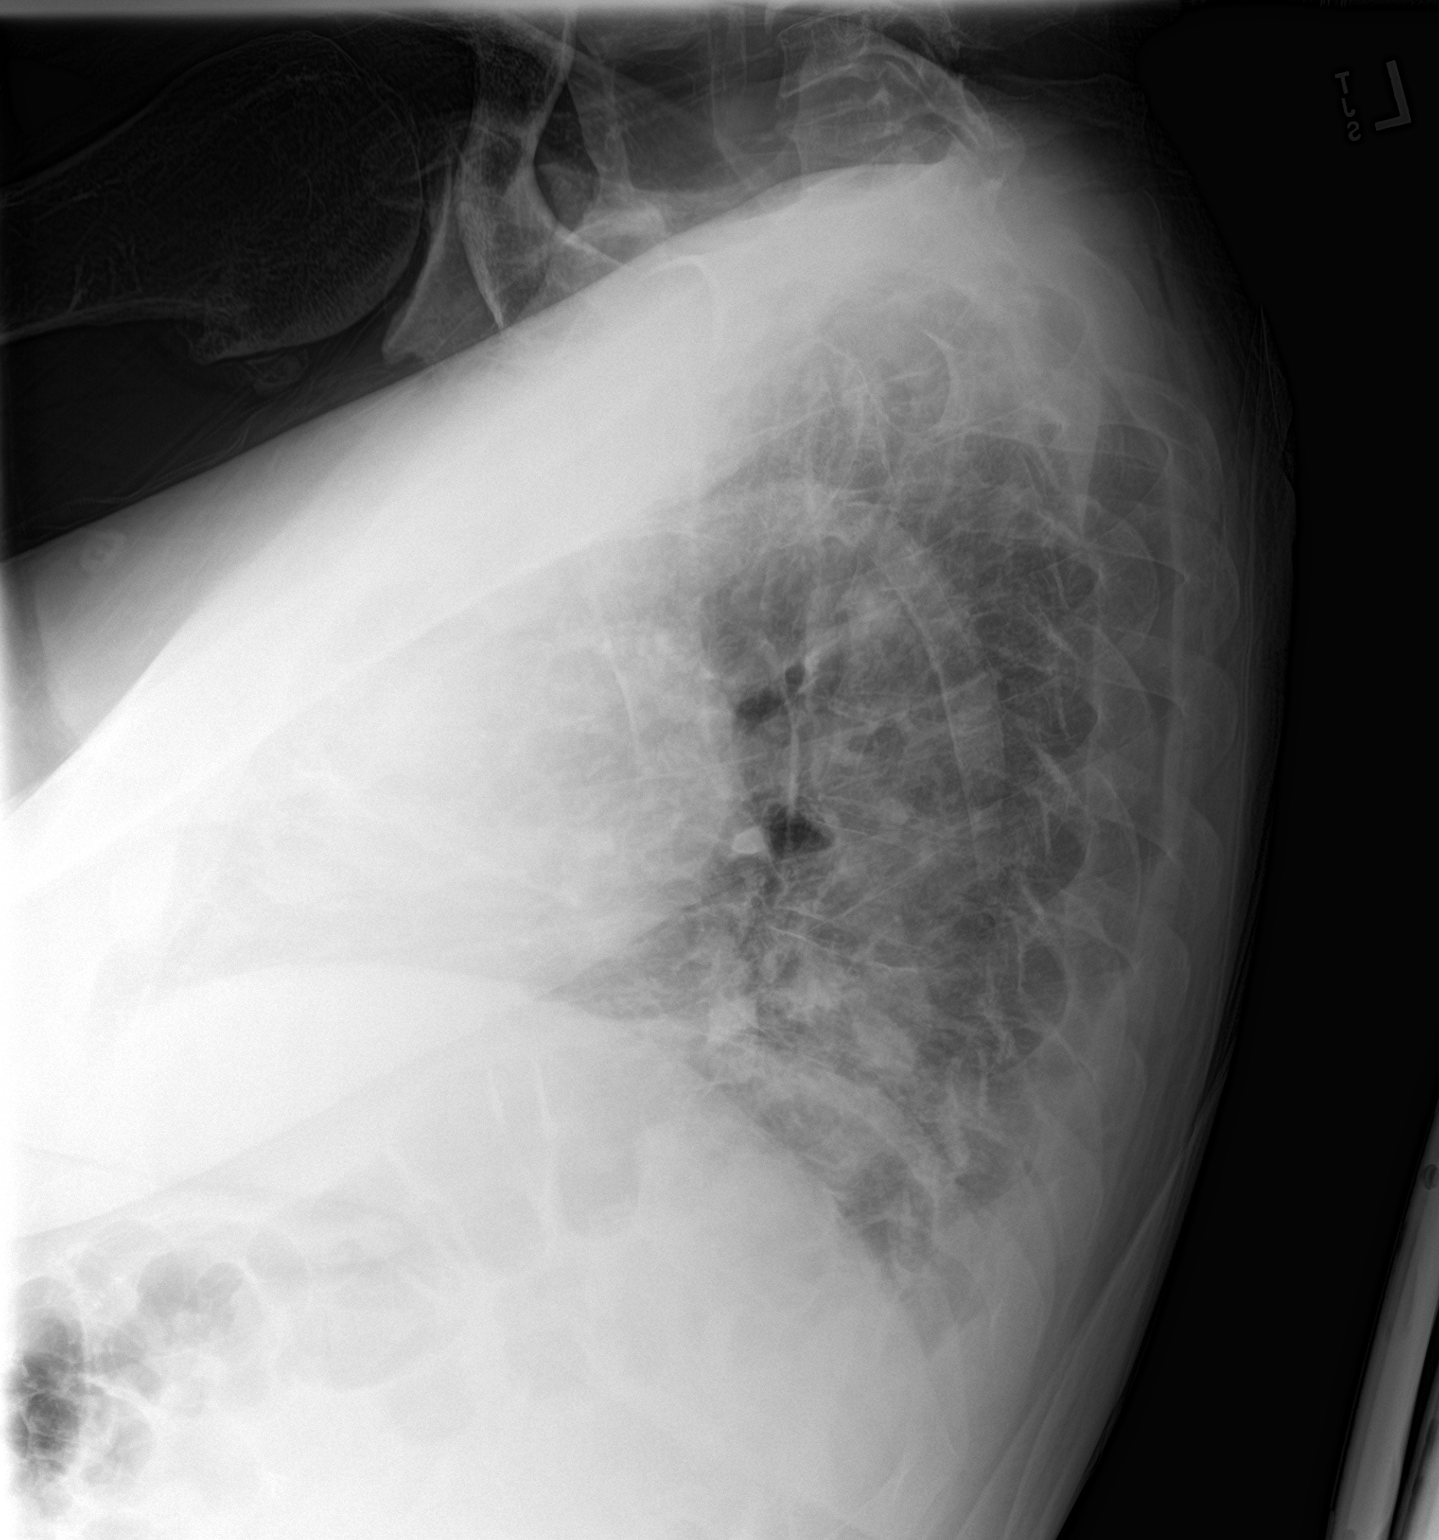
[im 2/2]
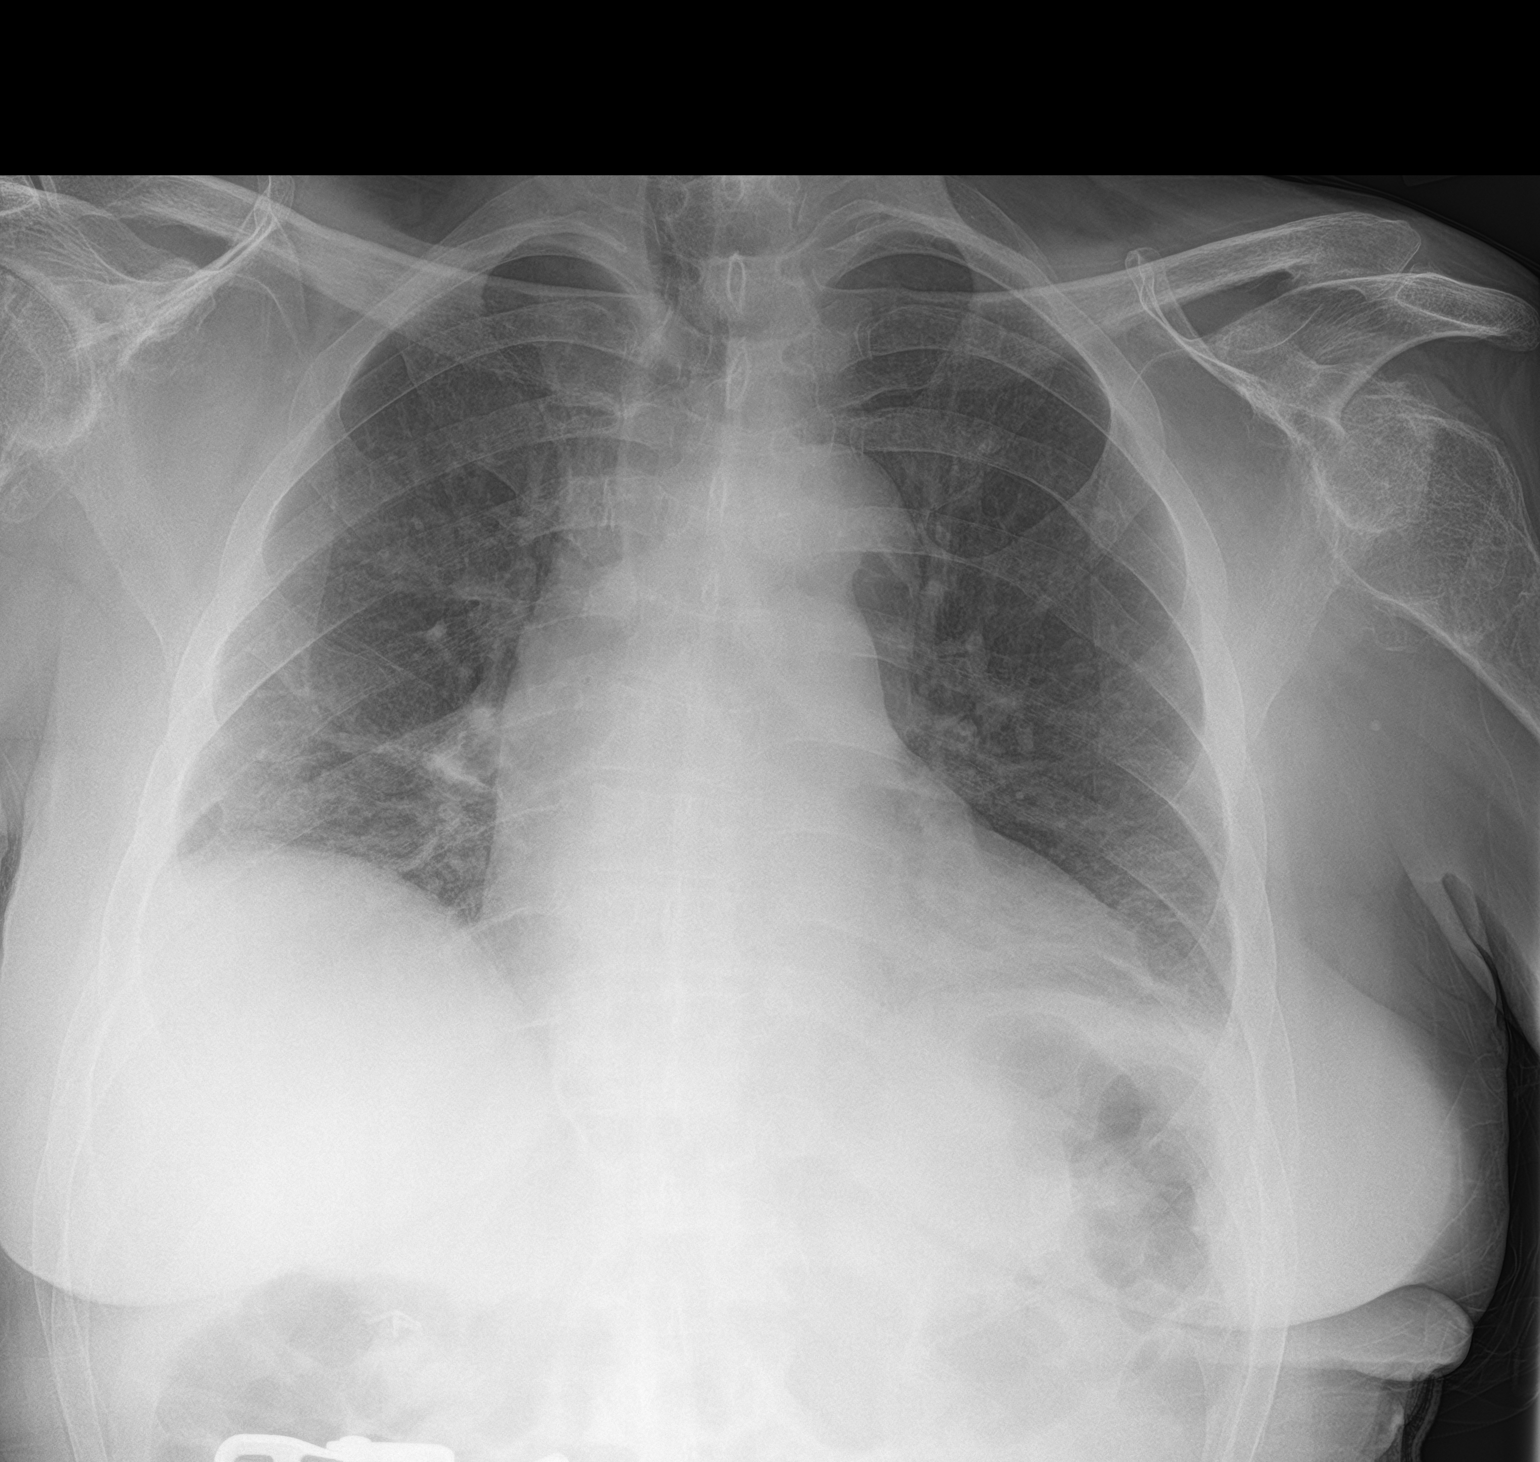

[2 of 2 positions shown; findings below may reference images not displayed]

FINDINGS: Transverse diameter of heart is increased. Thoracic aorta is
tortuous and ectatic. There is poor inspiration. There are no signs
of pulmonary edema or focal pulmonary consolidation. Small linear
densities are seen in the lower lung fields. Lateral CP angles are
indistinct. There is no pneumothorax. Degenerative changes are noted
in both shoulders.
IMPRESSION: Cardiomegaly. There are no signs of pulmonary edema or focal
pulmonary consolidation. Linear densities in the lower lung fields
may suggest scarring or subsegmental atelectasis. Blunting of left
lateral CP angle may suggest pleural thickening or minimal effusion.

## 2023-12-03 IMAGING — CR DG CHEST 2V
2 series · 2 of 2 positions shown · non-contrast
Comparison: 10/02/2021

CLINICAL DATA: Cough

EXAM:
CHEST - 2 VIEW

[chest lat]
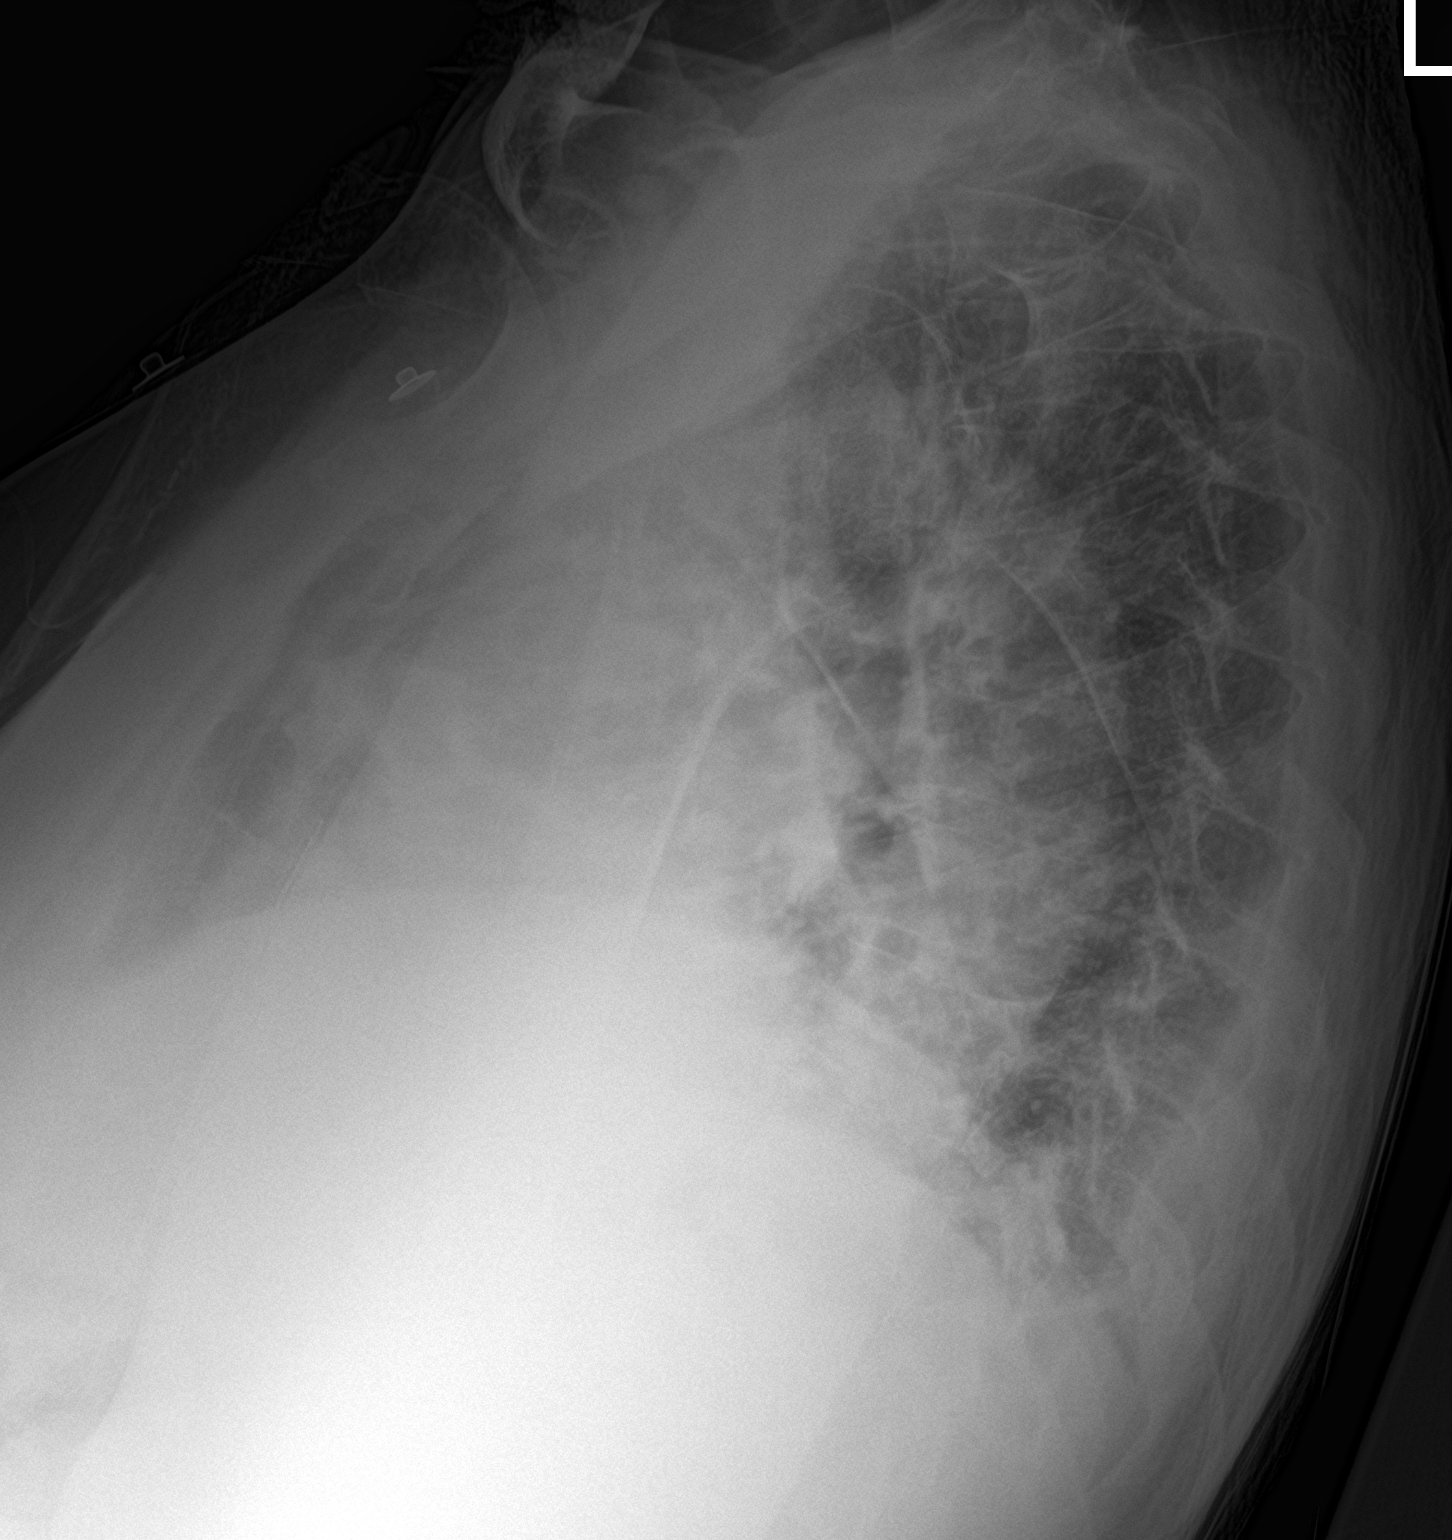

[chest ap]
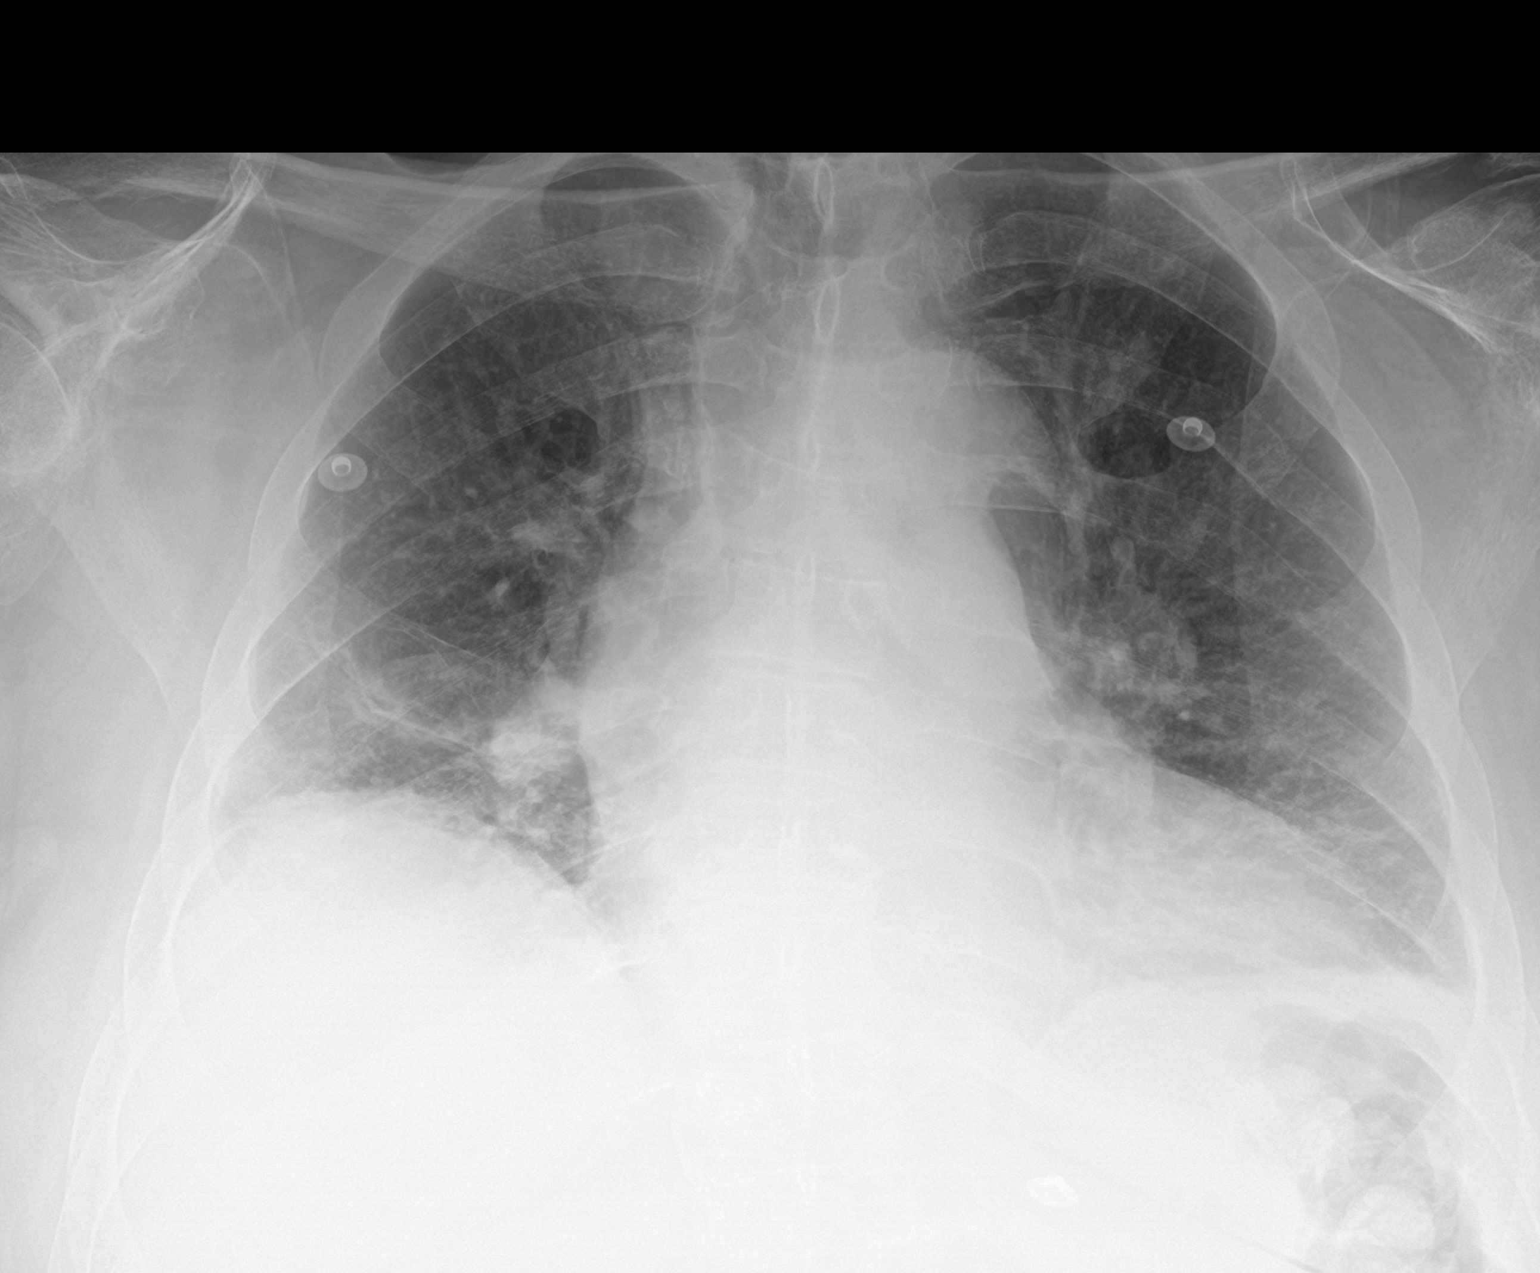

[2 of 2 positions shown; findings below may reference images not displayed]

FINDINGS: Frontal and lateral views of the chest demonstrate a stable cardiac
silhouette. Continued ectasia of the thoracic aorta. Progressive
patchy bibasilar consolidation. No effusion or pneumothorax. No
acute bony abnormalities.
IMPRESSION: 1. Progressive bibasilar consolidation, consistent with
bronchopneumonia.

## 2024-01-17 IMAGING — DX DG CHEST 1V
1 series · 1 of 1 positions shown · non-contrast
Comparison: October 07, 2021

CLINICAL DATA: Follow-up for aspiration pneumonia.

EXAM:
CHEST  1 VIEW

[chest pa]
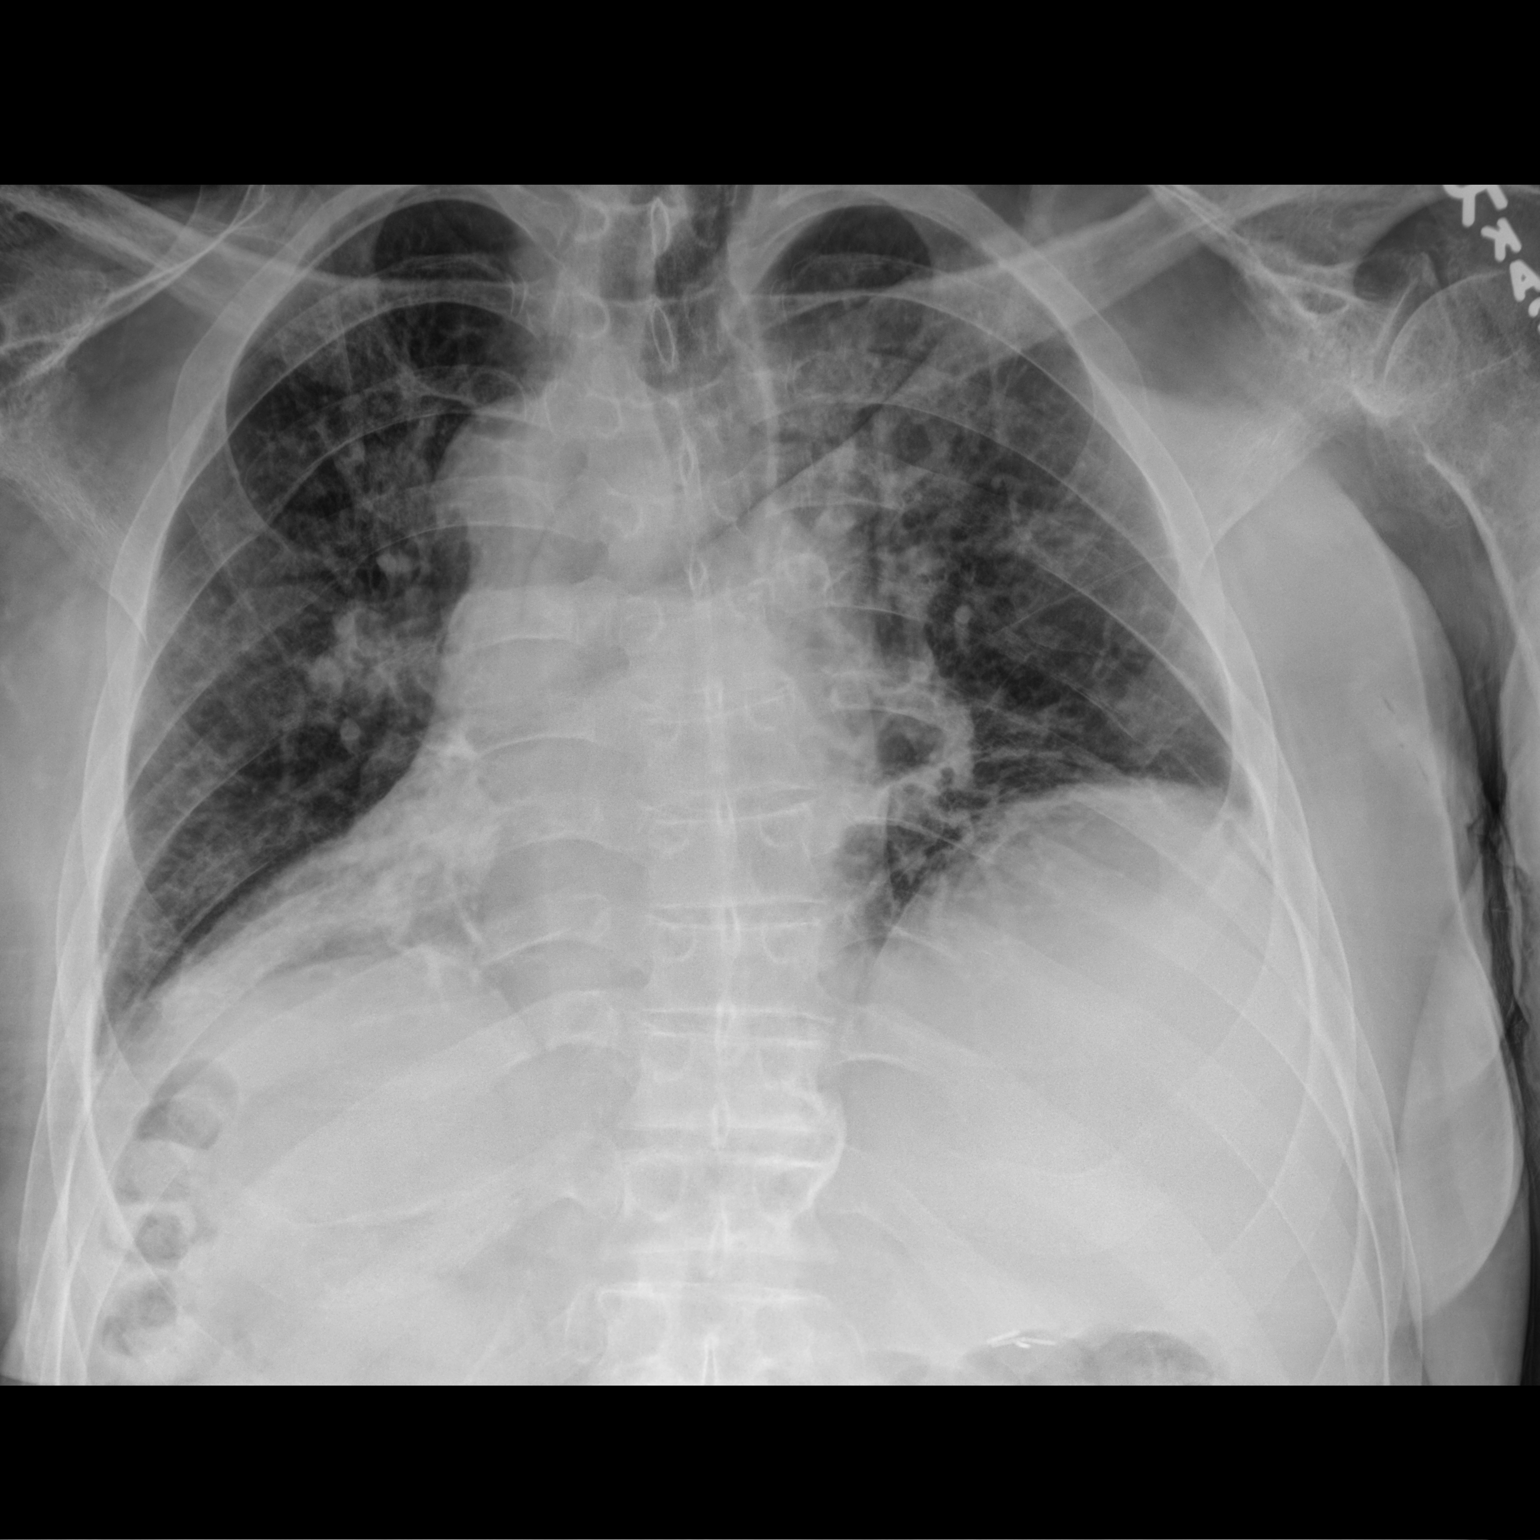

[1 of 1 positions shown; findings below may reference images not displayed]

FINDINGS: Decreased lung volumes are seen. There is mild prominence of the
pulmonary vasculature. Mild atelectasis and/or infiltrate is noted
within the bilateral lung bases. This is mildly decreased in
severity when compared to the prior exam. There is no evidence of a
pleural effusion or pneumothorax. The heart size and mediastinal
contours are within normal limits. Radiopaque surgical clips are
seen within the right upper quadrant. The visualized skeletal
structures are unremarkable.
IMPRESSION: 1. Mild bibasilar atelectasis and/or infiltrate, mildly decreased in
severity when compared to the prior study.
2. Decreased lung volumes with mild pulmonary vascular congestion.
